# Patient Record
Sex: Female | Born: 1990 | Race: White | Hispanic: No | Marital: Single | Smoking: Former smoker
Health system: Southern US, Community
[De-identification: ages and names within clinical notes are randomized; demographics above are authoritative.]

## PROBLEM LIST (undated history)

## (undated) DIAGNOSIS — R569 Unspecified convulsions: Secondary | ICD-10-CM

## (undated) DIAGNOSIS — K5900 Constipation, unspecified: Secondary | ICD-10-CM

## (undated) DIAGNOSIS — G8929 Other chronic pain: Secondary | ICD-10-CM

## (undated) DIAGNOSIS — E119 Type 2 diabetes mellitus without complications: Secondary | ICD-10-CM

## (undated) DIAGNOSIS — E039 Hypothyroidism, unspecified: Secondary | ICD-10-CM

## (undated) DIAGNOSIS — F419 Anxiety disorder, unspecified: Secondary | ICD-10-CM

## (undated) DIAGNOSIS — E785 Hyperlipidemia, unspecified: Secondary | ICD-10-CM

## (undated) DIAGNOSIS — F909 Attention-deficit hyperactivity disorder, unspecified type: Secondary | ICD-10-CM

## (undated) DIAGNOSIS — M549 Dorsalgia, unspecified: Secondary | ICD-10-CM

## (undated) DIAGNOSIS — F819 Developmental disorder of scholastic skills, unspecified: Secondary | ICD-10-CM

## (undated) DIAGNOSIS — F319 Bipolar disorder, unspecified: Secondary | ICD-10-CM

## (undated) DIAGNOSIS — F32A Depression, unspecified: Secondary | ICD-10-CM

## (undated) HISTORY — PX: THROAT SURGERY: SHX803

---

## 2013-09-02 ENCOUNTER — Emergency Department (HOSPITAL_COMMUNITY)
Admission: EM | Admit: 2013-09-02 | Discharge: 2013-09-02 | Disposition: A | Discharge status: Other Acute Inpatient Hospital | Payer: Medicaid Other | Attending: Emergency Medicine | Admitting: Emergency Medicine

## 2013-09-02 ENCOUNTER — Encounter (HOSPITAL_COMMUNITY): Payer: Self-pay | Admitting: Emergency Medicine

## 2013-09-02 ENCOUNTER — Emergency Department (HOSPITAL_COMMUNITY)
Admission: EM | Admit: 2013-09-02 | Discharge: 2013-09-03 | Disposition: A | Discharge status: Other Acute Inpatient Hospital | Payer: Medicaid Other | Attending: Emergency Medicine | Admitting: Emergency Medicine

## 2013-09-02 DIAGNOSIS — R4182 Altered mental status, unspecified: Secondary | ICD-10-CM | POA: Insufficient documentation

## 2013-09-02 DIAGNOSIS — Z3202 Encounter for pregnancy test, result negative: Secondary | ICD-10-CM | POA: Insufficient documentation

## 2013-09-02 DIAGNOSIS — Z880 Allergy status to penicillin: Secondary | ICD-10-CM | POA: Insufficient documentation

## 2013-09-02 DIAGNOSIS — F1210 Cannabis abuse, uncomplicated: Secondary | ICD-10-CM

## 2013-09-02 DIAGNOSIS — F1410 Cocaine abuse, uncomplicated: Secondary | ICD-10-CM | POA: Diagnosis not present

## 2013-09-02 DIAGNOSIS — F17200 Nicotine dependence, unspecified, uncomplicated: Secondary | ICD-10-CM | POA: Diagnosis present

## 2013-09-02 DIAGNOSIS — F29 Unspecified psychosis not due to a substance or known physiological condition: Secondary | ICD-10-CM

## 2013-09-02 DIAGNOSIS — G40909 Epilepsy, unspecified, not intractable, without status epilepticus: Secondary | ICD-10-CM | POA: Insufficient documentation

## 2013-09-02 DIAGNOSIS — Z79899 Other long term (current) drug therapy: Secondary | ICD-10-CM | POA: Insufficient documentation

## 2013-09-02 DIAGNOSIS — F1220 Cannabis dependence, uncomplicated: Secondary | ICD-10-CM | POA: Diagnosis present

## 2013-09-02 DIAGNOSIS — R4189 Other symptoms and signs involving cognitive functions and awareness: Secondary | ICD-10-CM | POA: Diagnosis present

## 2013-09-02 HISTORY — DX: Unspecified convulsions: R56.9

## 2013-09-02 LAB — RAPID URINE DRUG SCREEN, HOSP PERFORMED
Amphetamines: NOT DETECTED
Barbiturates: NOT DETECTED
Benzodiazepines: NOT DETECTED
Cocaine: NOT DETECTED
Opiates: NOT DETECTED
Tetrahydrocannabinol: POSITIVE — AB

## 2013-09-02 LAB — CBC WITH DIFFERENTIAL/PLATELET
Basophils Absolute: 0 10*3/uL (ref 0.0–0.1)
Basophils Relative: 0 % (ref 0–1)
Eosinophils Absolute: 0.2 10*3/uL (ref 0.0–0.7)
Eosinophils Relative: 2 % (ref 0–5)
HCT: 44.6 % (ref 36.0–46.0)
Hemoglobin: 15.2 g/dL — ABNORMAL HIGH (ref 12.0–15.0)
Lymphocytes Relative: 30 % (ref 12–46)
Lymphs Abs: 2.6 10*3/uL (ref 0.7–4.0)
MCH: 28.3 pg (ref 26.0–34.0)
MCHC: 34.1 g/dL (ref 30.0–36.0)
MCV: 82.9 fL (ref 78.0–100.0)
Monocytes Absolute: 0.7 10*3/uL (ref 0.1–1.0)
Monocytes Relative: 9 % (ref 3–12)
Neutro Abs: 5.1 10*3/uL (ref 1.7–7.7)
Neutrophils Relative %: 59 % (ref 43–77)
Platelets: 323 10*3/uL (ref 150–400)
RBC: 5.38 MIL/uL — ABNORMAL HIGH (ref 3.87–5.11)
RDW: 12.9 % (ref 11.5–15.5)
WBC: 8.5 10*3/uL (ref 4.0–10.5)

## 2013-09-02 LAB — COMPREHENSIVE METABOLIC PANEL
ALT: 29 U/L (ref 0–35)
AST: 17 U/L (ref 0–37)
Albumin: 4.1 g/dL (ref 3.5–5.2)
Alkaline Phosphatase: 125 U/L — ABNORMAL HIGH (ref 39–117)
BUN: 5 mg/dL — ABNORMAL LOW (ref 6–23)
CO2: 24 mEq/L (ref 19–32)
Calcium: 10 mg/dL (ref 8.4–10.5)
Chloride: 101 mEq/L (ref 96–112)
Creatinine, Ser: 0.62 mg/dL (ref 0.50–1.10)
GFR calc Af Amer: >90 mL/min (ref >90)
GFR calc non Af Amer: >90 mL/min (ref >90)
Glucose, Bld: 123 mg/dL — ABNORMAL HIGH (ref 70–99)
Potassium: 3.6 mEq/L (ref 3.5–5.1)
Sodium: 136 mEq/L (ref 135–145)
Total Bilirubin: 0.6 mg/dL (ref 0.3–1.2)
Total Protein: 7.7 g/dL (ref 6.0–8.3)

## 2013-09-02 LAB — CARBAMAZEPINE LEVEL, TOTAL: Carbamazepine Lvl: <0.5 ug/mL — ABNORMAL LOW (ref 4.0–12.0)

## 2013-09-02 LAB — POCT PREGNANCY, URINE: Preg Test, Ur: NEGATIVE

## 2013-09-02 LAB — ETHANOL: Alcohol, Ethyl (B): <11 mg/dL (ref 0–11)

## 2013-09-02 MED ORDER — NICOTINE POLACRILEX 2 MG MT GUM: 2.0000 mg | CHEWING_GUM | OROMUCOSAL | Status: DC | PRN

## 2013-09-02 MED ORDER — ONDANSETRON HCL 4 MG PO TABS: 4.0000 mg | ORAL_TABLET | Freq: Three times a day (TID) | ORAL | Status: DC | PRN

## 2013-09-02 MED ORDER — ACETAMINOPHEN 325 MG PO TABS
650.0000 mg | ORAL_TABLET | Freq: Once | ORAL | Status: AC
  Administered 2013-09-02: 325 mg via ORAL
  Filled 2013-09-02: qty 2

## 2013-09-02 MED ORDER — ACETAMINOPHEN 325 MG PO TABS
325.0000 mg | ORAL_TABLET | Freq: Once | ORAL | Status: DC
  Filled 2013-09-02: qty 1

## 2013-09-02 NOTE — ED Provider Notes (Signed)
CSN: [id number]     Arrival date & time 09/02/13  1309 History  This chart was scribed for non-physician practitioner, Teressa Lower, NP working with Doug Sou, MD by Greggory Stallion, ED scribe. This patient was seen in room [facility] and the patient's care was started at 2:04 PM.   Chief Complaint  Patient presents with  . Medical Clearance   The history is provided by the patient. No language interpreter was used.   HPI Comments: Tami [name] is a 22 y.o. female who presents to the Emergency Department for medical clearance. Pt is here to get labs done for [facility]. She states she last used "powder" crack two weeks ago. Pt denies SI. She takes medications daily. Pt has papers on her as pt was wandering around without appropriate seasonal clothes on:pt is unsure why she is here  Past Medical History  Diagnosis Date  . Seizures    History reviewed. No pertinent past surgical history. No family history on file. History  Substance Use Topics  . Smoking status: Current Every Day Smoker  . Smokeless tobacco: Not on file  . Alcohol Use: No   OB History   Grav Para Term Preterm Abortions TAB SAB Ect Mult Living                 Review of Systems  Psychiatric/Behavioral: Negative for suicidal ideas.  All other systems reviewed and are negative.    Allergies  Benadryl and Penicillins  Home Medications   Current Outpatient Rx  Name  Route  Sig  Dispense  Refill  . carbamazepine (TEGRETOL) 200 MG tablet   Oral   Take 200 mg by mouth 4 (four) times daily.         Marland Kitchen ibuprofen (ADVIL,MOTRIN) 200 MG tablet   Oral   Take 200 mg by mouth every 6 (six) hours as needed for pain.          BP 118/83  Pulse 100  Temp(Src) 98.8 F (37.1 C) (Oral)  Resp 16  SpO2 100%  LMP 07/03/2013  Physical Exam  Nursing note and vitals reviewed. Constitutional: She is oriented to person, place, and time. She appears well-developed and well-nourished. No distress.  HENT:  Head:  Normocephalic and atraumatic.  Eyes: EOM are normal.  Neck: Neck supple. No tracheal deviation present.  Cardiovascular: Normal rate.   Pulmonary/Chest: Effort normal. No respiratory distress.  Musculoskeletal: Normal range of motion.  Neurological: She is alert and oriented to person, place, and time.  Skin: Skin is warm and dry.  Psychiatric: She expresses no homicidal and no suicidal ideation.  Pt is slow to answer question with response that is not able to understand    ED Course  Procedures (including critical care time)  DIAGNOSTIC STUDIES: Oxygen Saturation is 100% on RA, normal by my interpretation.    COORDINATION OF CARE: 2:06 PM-Discussed treatment plan which includes labs with pt at bedside and pt agreed to plan.   Labs Review Labs Reviewed  CBC WITH DIFFERENTIAL - Abnormal; Notable for the following:    RBC 5.38 (*)    Hemoglobin 15.2 (*)    All other components within normal limits  COMPREHENSIVE METABOLIC PANEL - Abnormal; Notable for the following:    Glucose, Bld 123 (*)    BUN 5 (*)    Alkaline Phosphatase 125 (*)    All other components within normal limits  URINE RAPID DRUG SCREEN (HOSP PERFORMED) - Abnormal; Notable for the following:    Tetrahydrocannabinol POSITIVE (*)  All other components within normal limits  ETHANOL  POCT PREGNANCY, URINE   Imaging Review No results found.  EKG Interpretation   None       MDM   1. Altered mental status   2. Marijuana abuse        I personally performed the services described in this documentation, which was scribed in my presence. The recorded information has been reviewed and is accurate.   Teressa Lower, NP 09/02/13 1520

## 2013-09-02 NOTE — BH Assessment (Addendum)
BHH Assessment Progress Note Update:  Called ED and per Triage nurse, tele assessment scheduled for 1745.  Received call for tele assessment.  This scheduled for 1730.

## 2013-09-02 NOTE — Consult Note (Signed)
Agree with plan 

## 2013-09-02 NOTE — ED Notes (Signed)
I received a phone call from [facility] earlier today. Told me about this patient who was coming over for med clearance.  [facility] told me that this patient was confused and psychotic but as long as she was medically cleared, she would be accepted back to [facility].  Pt came to [facility], had labs drawn, urine collected, was medically cleared by a provider, and was accepted back to [facility] after results were faxed to [facility].  Less than one hour after this patient went back to [place], I received another phone call from [place] saying that they were sending the patient back to [place] because the patient was confused.  They stated that the confusion was no more than it was when they originally saw the patient but that the doctor stated that she may be confused because of a possible recent seizure.  I told them that the patient had been medically clear and unless the patient had a seizure since she was with [place], there was no reason for her to come back.  I was told that she was coming back anyway.  I requested that the doctor at [facility] talk to one of our doctors in order for one of our doctors to accept her.  [facility] sent the patient over anyway.  The doctor called after the patient was already a patient back over here and told me that we needed to do an EEG.  I told him that we do not do EEGs in the ER and that if the patient returned, we would not do anything differently than they would at [facility] because the patient is not having active seizures and stated that the last one was a month or more ago.  Dr. Loretha Stapler spoke with the doctor about what part of his assessment made him think that the patient's behavior was due to seizures.  The doctor at [facility] admitted that he hadn't actually assessed the patient.

## 2013-09-02 NOTE — ED Notes (Signed)
Patient confused, unable to tell RN what city she is from or where she is living currently. Pt only repeating the phrase "When can I leave?". Pt with thought-blocking. No distress noted at this time.

## 2013-09-02 NOTE — ED Notes (Signed)
Dropped 1 Tylenol on the floor

## 2013-09-02 NOTE — ED Provider Notes (Signed)
  Physical Exam  LMP 07/03/2013  Physical Exam  ED Course  Procedures  MDM 5:00 PM Pt cam back from [facility] because they say that they have no way to give her her tegretol:pt states that she is unsure if she is taking the medication:will day a level  5:44 PM Pt states that she is not on tegretol and hasn't been since she was young:pt to be evaluated by tts    Teressa Lower, NP 09/02/13 1701  Teressa Lower, NP 09/02/13 1745

## 2013-09-02 NOTE — ED Notes (Addendum)
Pt here for Tegretol level after being medically cleared and sent to [facility], was sent back d/t risk of seizures. Pt will be cleared and sent to psych. GPD at bedside

## 2013-09-02 NOTE — Consult Note (Signed)
[facility] Face-to-Face Psychiatry Consult   Reason for Consult:  ED Referral/ Referring Physician:  ED Providers /Dr Dorice Lamas Tami [name] is an 22 y.o. female.  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 Seizures    AXIS IV:  problems with access to health care services AXIS V:  21-30 behavior considerably influenced by delusions or hallucinations OR serious impairment in judgment, communication OR inability to function in almost all areas  Plan:  Recommend psychiatric Inpatient admission when medically cleared.  Subjective:   Tami [name] is a 22 y.o. female patient admitted with thought disorder of unknown etiology.She was attended by [facility] today who returned her to the ED after she was medically cleared at their request by a [facility] Physician who did not examine her but ?supposed her mental status was due to seizures and on the grounds she neeeded an EEG because of a hx of seizure disorder(see ED Provider note for return visit ).She underwent teleassessment earlier and was felt to be psychotic and neede IP treatment.  HPI:  AS above.Also see ED provider note. Pt states she went to a party last nite and met a guy who said they should be together.She states she sat in the hall of the hotel where the party was and was aked to leave.She at first said she was homeless but then stated that a friend had offerd to let her stay at her place(-she would go there tonite if she was released) ?She denies being given any drug at the party or smoking any THC/other smokeable drug.She did admit to using cocaine last week to the staff.She denies any recent pot use or drug use. She states she moved to [place] from [place] 5 mos ago to her uncle's home Tami [name]). She says her winter clothes are at her mom's exboyfriend's house. She states she was in [place] in [place].She states he was on Tegretol after  seizure 5 mos ago but has not taken it in a while. She doesn't know why she hasnt taken it.The only other rx Med she recalls being on is Adderall.She is craving a cigarette and cannot wear a patch due to skin reaction to adhesive patch. HPI Elements:   Context:  Per HPI.  Past Psychiatric History: Past Medical History  Diagnosis Date  . Seizures     reports that she has been smoking.  She does not have any smokeless tobacco history on file. She reports that she uses illicit drugs (Marijuana). She reports that she does not drink alcohol. No family history on file. Family History Substance Abuse: No Family Supports: Yes, List: (parent per pt) Living Arrangements: Other (Comment) (Homeless) Can pt return to current living arrangement?: Yes Abuse/Neglect [facility]) Physical Abuse: Denies Verbal Abuse: Denies Sexual Abuse: Denies Allergies:  Nicorette patch (adhesive) Allergies  Allergen Reactions  . Benadryl [Diphenhydramine]   . Penicillins     ACT Assessment Complete:  Yes:    Educational Status    Risk to Self: Risk to self Suicidal Ideation: No Suicidal Intent: No Is patient at risk for suicide?: No Suicidal Plan?: No Access to Means: No What has been your use of drugs/alcohol within the last 12 months?: pt denies, but UDS positive for THC Previous Attempts/Gestures: No How many times?: 0 Other Self Harm Risks: pt denies Triggers for Past Attempts: None known Intentional Self Injurious Behavior: None Family Suicide History: No Recent stressful life event(s):  Other (Comment) (pt psychotic, under IVC, homeless) Persecutory voices/beliefs?: No Depression:  (pt denies) Depression Symptoms:  (pt denies) Substance abuse history and/or treatment for substance abuse?: Yes Suicide prevention information given to non-admitted patients: Not applicable  Risk to Others: Risk to Others Homicidal Ideation: No Thoughts of Harm to Others: No Current Homicidal Intent: No Current Homicidal  Plan: No Access to Homicidal Means: No Identified Victim: pt denies History of harm to others?: No Assessment of Violence: None Noted Violent Behavior Description: na - pt calm, cooperative Does patient have access to weapons?: No Criminal Charges Pending?: No Does patient have a court date: No  Abuse: Abuse/Neglect Assessment (Assessment to be complete while patient is alone) Physical Abuse: Denies Verbal Abuse: Denies Sexual Abuse: Denies Exploitation of patient/patient's resources: Denies Self-Neglect: Denies  Prior Inpatient Therapy: Prior Inpatient Therapy Prior Inpatient Therapy: No Prior Therapy Dates: na Prior Therapy Facilty/Provider(s): na Reason for Treatment: na  Prior Outpatient Therapy: Prior Outpatient Therapy Prior Outpatient Therapy: Yes Prior Therapy Dates: pt couldn't recall date - when she was younger by report Prior Therapy Facilty/Provider(s): Unknown provider Reason for Treatment: ADHD  Additional Information: Additional Information 1:1 In Past 12 Months?: No CIRT Risk: No Elopement Risk: No Does patient have medical clearance?: Yes                  Objective: Blood pressure 120/84, pulse 87, temperature 98 F (36.7 C), temperature source Oral, resp. rate 15, last menstrual period 07/03/2013, SpO2 100.00%.There is no height or weight on file to calculate BMI. Results for orders placed during the hospital encounter of 09/02/13 (from the past 72 hour(s))  CARBAMAZEPINE LEVEL, TOTAL     Status: Abnormal   Collection Time    09/02/13  5:00 PM      Result Value Range   Carbamazepine Lvl <0.5 (*) 4.0 - 12.0 ug/mL   Comment: Performed at [facility] are reviewed and are pertinent for No tegretol level and THC in Urine.  Current Facility-Administered Medications  Medication Dose Route Frequency Provider Last Rate Last Dose  . acetaminophen (TYLENOL) tablet 325 mg  325 mg Oral Once Teressa Lower, NP      . ondansetron  (ZOFRAN) tablet 4 mg  4 mg Oral Q8H PRN Teressa Lower, NP       Current Outpatient Prescriptions  Medication Sig Dispense Refill  . carbamazepine (TEGRETOL) 200 MG tablet Take 200 mg by mouth 4 (four) times daily.      Marland Kitchen ibuprofen (ADVIL,MOTRIN) 200 MG tablet Take 200 mg by mouth every 6 (six) hours as needed for pain.        Psychiatric Specialty Exam:     Blood pressure 120/84, pulse 87, temperature 98 F (36.7 C), temperature source Oral, resp. rate 15, last menstrual period 07/03/2013, SpO2 100.00%.There is no height or weight on file to calculate BMI.  General Appearance: Disheveled  Eye Solicitor::  Fair  Speech:  Blocked/slow/garbled at times  Volume:  Normal  Mood:  Euthymic  Affect:  Flat  Thought Process:  Disorganized  Orientation:  Other:  to person  Thought Content:  Responds to questions but at one point she takes a piece of paper out from under her pillow and writs somthing on it then puts it back.When she talked about her uncle Tami [name] she took the paper out again and asked "How do you spell Tami [name]" She alluded to the fact she was having trouble remebering (names)  Suicidal Thoughts:  No  Homicidal Thoughts:  No  Memory:  Immediate;   Poor  Judgement:  Impaired  Insight:  Lacking  Psychomotor Activity:  NA  Concentration:  Poor  Recall:  Poor  Akathisia:  NA  Handed:  Right  AIMS (if indicated):     Assets:  Others:  has family  Sleep:      Treatment Plan Summary: Need her records from [facility] (?[place]).Continue to observe in ED and reassess in AM.The3 etiology of her thought disorder is unclear at this time  Court Joy 09/02/2013 8:14 PM

## 2013-09-02 NOTE — ED Notes (Signed)
Pt lab results faxed and called report to [place] at [facility]. Awaiting her callback to clear pt to be transported back to [facility]

## 2013-09-02 NOTE — ED Provider Notes (Signed)
Medical screening examination/treatment/procedure(s) were performed by non-physician practitioner and as supervising physician I was immediately available for consultation/collaboration.  EKG Interpretation   None        Doug Sou, MD 09/02/13 1559

## 2013-09-02 NOTE — BH Assessment (Addendum)
Tele Assessment Note   Tami [name] is an 22 y.o. female that was assessed this day per order by Teressa Lower, NP at [facility].  Pt presented there with GPD under IVC from [facility] requesting medical clearance.  It appears [facility] requested her Tegretol level, as pt has hx of seizures, and then pt transported back to [place].  However, pt turned away, sent back to [facility], stating she couldn't get ehr medications there.  Pt appears confused and was found wandering around without appropriate seasonal clothing on.  Pt is only oriented to person during assessment with this clinician.  She appears confused, pauses frequently before answering questions, and has rapid, pressured speech, and appears irritated, confused, and preoccupied.  Pt appears to be responding to internal stimuli, although denies AVH.  Pt denies SI, HI or SA.  However, she told ED nurse she used crack cocaine 2 weeks ago and her UDS was positive for THC.  Pt stated she has a hx of seizures but has not been taking her medication.  Pt stated she was treated for ADHD as a child, but has had no MH or SA treatment since.  Pt stated she was at a hotel at a party and then a drug treatment center to get labs earlier [facility]?) and then she was to go to a homeless shelter.  However, pt stated she lives with a friend and isn't homeless.  Pt appears psychotic and is a poor historian.  Consulted with NP Pickering who agreed further examination and recommendations warranted by an extender from [facility].  Inpatient treatment is recommended by this clinician.  A tele psych will be scheduled.  Updated ED staff and TTS staff.  Axis I: 298.9 Psychotic Disorder NOS Axis II: Deferred Axis III:  Past Medical History  Diagnosis Date  . Seizures    Axis IV: housing problems, other psychosocial or environmental problems and problems related to social environment Axis V: 21-30 behavior considerably influenced by delusions or hallucinations OR serious impairment in  judgment, communication OR inability to function in almost all areas  Past Medical History:  Past Medical History  Diagnosis Date  . Seizures     History reviewed. No pertinent past surgical history.  Family History: No family history on file.  Social History:  reports that she has been smoking.  She does not have any smokeless tobacco history on file. She reports that she uses illicit drugs (Marijuana). She reports that she does not drink alcohol.  Additional Social History:  Alcohol / Drug Use Pain Medications: see MAR Prescriptions: see MAR Over the Counter: see MAR History of alcohol / drug use?:  (pt denies, but UDS positive for THC) Longest period of sobriety (when/how long):  (pt denies) Negative Consequences of Use:  (pt denies) Withdrawal Symptoms:  (pt denies)  CIWA: CIWA-Ar BP: 107/72 mmHg Pulse Rate: 90 COWS:    Allergies:  Allergies  Allergen Reactions  . Benadryl [Diphenhydramine]   . Penicillins     Home Medications:  (Not in a hospital admission)  OB/GYN Status:  Patient's last menstrual period was 07/03/2013.  General Assessment Data [facility] of Assessment: [facility] Is this a Tele or Face-to-Face Assessment?: Tele Assessment Is this an Initial Assessment or a Re-assessment for this encounter?: Initial Assessment Living Arrangements: Other (Comment) (Homeless) Can pt return to current living arrangement?: Yes Admission Status: Involuntary Is patient capable of signing voluntary admission?: No (pt is psychotic) Transfer from: Acute Hospital Referral Source: Other Museum/gallery curator)  Medical Screening Exam [facility] Walk-in  ONLY) Medical Exam completed: No Reason for MSE not completed: Other: (pt med cleared at [facility])  [facility] Crisis Care Plan Living Arrangements: Other (Comment) (Homeless) Name of Psychiatrist: none Name of Therapist: none  Education Status Is patient currently in school?: No  Risk to self Suicidal Ideation: No Suicidal Intent: No Is patient  at risk for suicide?: No Suicidal Plan?: No Access to Means: No What has been your use of drugs/alcohol within the last 12 months?: pt denies, but UDS positive for THC Previous Attempts/Gestures: No How many times?: 0 Other Self Harm Risks: pt denies Triggers for Past Attempts: None known Intentional Self Injurious Behavior: None Family Suicide History: No Recent stressful life event(s): Other (Comment) (pt psychotic, under IVC, homeless) Persecutory voices/beliefs?: No Depression:  (pt denies) Depression Symptoms:  (pt denies) Substance abuse history and/or treatment for substance abuse?: No Suicide prevention information given to non-admitted patients: Not applicable  Risk to Others Homicidal Ideation: No Thoughts of Harm to Others: No Current Homicidal Intent: No Current Homicidal Plan: No Access to Homicidal Means: No Identified Victim: pt denies History of harm to others?: No Assessment of Violence: None Noted Violent Behavior Description: na - pt calm, cooperative Does patient have access to weapons?: No Criminal Charges Pending?: No Does patient have a court date: No  Psychosis Hallucinations:  (pt denies, but appears to be responding to internal stimuli) Delusions:  (Unknown)  Mental Status Report Appear/Hygiene: Disheveled Eye Contact: Good Motor Activity: Freedom of movement;Unremarkable Speech: Rapid;Pressured Level of Consciousness: Alert;Irritable Mood: Irritable;Suspicious Affect: Preoccupied Anxiety Level: Minimal Thought Processes: Coherent;Relevant Judgement: Impaired Orientation: Person Obsessive Compulsive Thoughts/Behaviors: None  Cognitive Functioning Concentration:  (UTA) Memory:  (UTA) IQ: Average Insight:  (UTA) Impulse Control:  (UTA) Appetite: Good Weight Loss: 0 Weight Gain: 0 Sleep: No Change Total Hours of Sleep:  ("through the night") Vegetative Symptoms: None  ADLScreening [facility] Assessment Services) Patient's cognitive  ability adequate to safely complete daily activities?: Yes Patient able to express need for assistance with ADLs?: Yes Independently performs ADLs?: Yes (appropriate for developmental age)  Prior Inpatient Therapy Prior Inpatient Therapy: No Prior Therapy Dates: na Prior Therapy Facilty/Provider(s): na Reason for Treatment: na  Prior Outpatient Therapy Prior Outpatient Therapy: Yes Prior Therapy Dates: pt couldn't recall date - when she was younger by report Prior Therapy Facilty/Provider(s): Unknown provider Reason for Treatment: ADHD  ADL Screening (condition at time of admission) Patient's cognitive ability adequate to safely complete daily activities?: Yes Is the patient deaf or have difficulty hearing?: No Does the patient have difficulty seeing, even when wearing glasses/contacts?: No Does the patient have difficulty concentrating, remembering, or making decisions?: No Patient able to express need for assistance with ADLs?: Yes Does the patient have difficulty dressing or bathing?: No Independently performs ADLs?: Yes (appropriate for developmental age)  Home Assistive Devices/Equipment Home Assistive Devices/Equipment: None    Abuse/Neglect Assessment (Assessment to be complete while patient is alone) Physical Abuse: Denies Verbal Abuse: Denies Sexual Abuse: Denies Exploitation of patient/patient's resources: Denies Self-Neglect: Denies Values / Beliefs Cultural Requests During Hospitalization: None Spiritual Requests During Hospitalization: None Consults Spiritual Care Consult Needed: No Social Work Consult Needed: No Merchant navy officer (For Healthcare) Advance Directive: Patient does not have advance directive;Patient would not like information    Additional Information 1:1 In Past 12 Months?: No CIRT Risk: No Elopement Risk: No Does patient have medical clearance?: Yes     Disposition:  Disposition Initial Assessment Completed for this Encounter:  Yes Disposition of Patient: Other dispositions Other  disposition(s): Other (Comment) (Pending tele psych by extender)  Caryl Comes 09/02/2013 6:13 PM

## 2013-09-02 NOTE — ED Notes (Addendum)
Pt from [place] with security requesting med clearance to return to [facility]. Pt is IVC. Pt is unable to answer some questions, appears to be confused as per stated in IVC papers. Pt denies self harm

## 2013-09-02 NOTE — ED Notes (Signed)
Unable to obtain medical records from [place] at this time.

## 2013-09-03 ENCOUNTER — Encounter (HOSPITAL_COMMUNITY): Payer: Self-pay | Admitting: *Deleted

## 2013-09-03 ENCOUNTER — Inpatient Hospital Stay (HOSPITAL_COMMUNITY)
Admission: AD | Admit: 2013-09-03 | Discharge: 2013-09-11 | DRG: 885 | Disposition: A | Discharge status: Home / Self Care | Payer: Federal, State, Local not specified - Other | Source: Intra-hospital | Attending: Psychiatry | Admitting: Psychiatry

## 2013-09-03 DIAGNOSIS — R569 Unspecified convulsions: Secondary | ICD-10-CM | POA: Diagnosis present

## 2013-09-03 DIAGNOSIS — F29 Unspecified psychosis not due to a substance or known physiological condition: Secondary | ICD-10-CM | POA: Diagnosis present

## 2013-09-03 DIAGNOSIS — F1220 Cannabis dependence, uncomplicated: Secondary | ICD-10-CM | POA: Diagnosis present

## 2013-09-03 DIAGNOSIS — F1410 Cocaine abuse, uncomplicated: Secondary | ICD-10-CM

## 2013-09-03 DIAGNOSIS — Z79899 Other long term (current) drug therapy: Secondary | ICD-10-CM

## 2013-09-03 DIAGNOSIS — F319 Bipolar disorder, unspecified: Principal | ICD-10-CM | POA: Diagnosis present

## 2013-09-03 NOTE — BHH Counselor (Signed)
Writer called [facility] and asked him to send pt referral to [facility] as [facility] has some acute beds available (per Kristen CSW).  Tami [name], LCSWA Assessment Counselor  

## 2013-09-03 NOTE — Progress Notes (Signed)
Patient ID: Tami [name], female   DOB: [birth year], 22 y.o.   MRN: [id number] Patient Identification:  Tami [name] Date of Evaluation:  09/03/2013   History of Present Illness:  Patient was picked up from  A hotel lobby  With no place to go to.  Patient states she is homeless and stayed at the hotel lobby after a party last night.  She ended up in [place] with a female and female acquaintance.  Patient ended up at [facility] for medical confused and asking for her medications.   Staff from [facility] sent her to [facility] for medical clearance.   Patient was vague in answering questions and states she has her medications in her bag.  It is not known if patient is taking her medications as ordered since her Tegretol level is sub therapeutic.  Patient does not remember her mother's number for collateral information.  We will obtain medical record from [facility] to enable [place] plan care for the patient.  We have accepted her for admission for safety and stabilization.  She denies SI/HI/AVH.  Past Psychiatric History: Schizoaffective d/o, bipolar type   Past Medical History:     Past Medical History  Diagnosis Date  . Seizures       History reviewed. No pertinent past surgical history.  Allergies:  Allergies  Allergen Reactions  . Benadryl [Diphenhydramine]   . Penicillins     Current Medications:  Prior to Admission medications   Medication Sig Start Date End Date Taking? Authorizing Provider  carbamazepine (TEGRETOL) 200 MG tablet Take 200 mg by mouth 4 (four) times daily.   Yes Historical Provider, MD  ibuprofen (ADVIL,MOTRIN) 200 MG tablet Take 200 mg by mouth every 6 (six) hours as needed for pain.   Yes Historical Provider, MD    Social History:    reports that she has been smoking.  She does not have any smokeless tobacco history on file. She reports that she uses illicit drugs (Marijuana). She reports that she does not drink alcohol.   Family History:    No family history on  file.  Mental Status Examination/Evaluation:Psychiatric Specialty Exam: Physical Exam  ROS  Blood pressure 106/72, pulse 63, temperature 97.6 F (36.4 C), temperature source Oral, resp. rate 18, last menstrual period 07/03/2013, SpO2 100.00%.There is no height or weight on file to calculate BMI.  General Appearance: Casual  Eye Contact::  Fair  Speech:  Pressured  Volume:  Normal  Mood:  Anxious and Depressed  Affect:  Blunt, Constricted, Depressed, Flat and Labile  Thought Process:  Disorganized  Orientation:  Full (Time, Place, and Person)  Thought Content:  confused, refusing to answer some qustions  Suicidal Thoughts:  No  Homicidal Thoughts:  No  Memory:  Immediate;   Fair Recent;   Fair Remote;   Fair  Judgement:  Poor  Insight:  Fair and Shallow  Psychomotor Activity:  Normal  Concentration:  Fair  Recall:  NA  Akathisia:  NA  Handed:  Right  AIMS (if indicated):     Assets:  Desire for Improvement  Sleep:          DIAGNOSIS:   AXIS I   Psychosis, R/O substance induced Psychosis  AXIS II  Deffered  AXIS III See medical notes.  AXIS IV housing problems, occupational problems, other psychosocial or environmental problems and problems related to social environment  AXIS V 21-30 behavior considerably influenced by delusions or hallucinations OR serious impairment in judgment, communication OR inability to function in almost  all areas     Assessment/Plan:  Face to face evaluation with Dr Tawni Carnes We have accepted patient for admission to our 400 unit hall for safety and stabilization We will also seek placement at other facilities with available beds We will be asking for her medical records from [facility].     Pt was interviewed with FNP. Agree with above assessment and plan.  Ancil Linsey, MD

## 2013-09-03 NOTE — Tx Team (Signed)
Initial Interdisciplinary Treatment Plan  PATIENT STRENGTHS: (choose at least two) Active sense of humor Physical Health Special hobby/interest  PATIENT STRESSORS: Financial difficulties Marital or family conflict Medication change or noncompliance Substance abuse   PROBLEM LIST: Problem List/Patient Goals Date to be addressed Date deferred Reason deferred Estimated date of resolution  "To get better, just being myself" 09/03/13           Increased risk for suicide 09/03/13     Decreased thought process 09/03/13                                    DISCHARGE CRITERIA:  Ability to meet basic life and health needs Adequate post-discharge living arrangements Improved stabilization in mood, thinking, and/or behavior Motivation to continue treatment in a less acute level of care Need for constant or close observation no longer present Reduction of life-threatening or endangering symptoms to within safe limits Safe-care adequate arrangements made Verbal commitment to aftercare and medication compliance  PRELIMINARY DISCHARGE PLAN: Attend aftercare/continuing care group Outpatient therapy Participate in family therapy Placement in alternative living arrangements  PATIENT/FAMIILY INVOLVEMENT: This treatment plan has been presented to and reviewed with the patient, [employer], and/or family member.  The patient and family have been given the opportunity to ask questions and make suggestions.  Tami [name] 09/03/2013, 10:35 PM

## 2013-09-03 NOTE — Progress Notes (Signed)
B.Alcides Nutting, MHT was requested to seek placement for patient. Writer contacted the following facilities listed below;    [facility] spoke with Dennie Bible who reports availability and to fax for review  [facility] spoke with Rollene Fare who reports availability and to fax for review  [facility] spoke with [place] who reports availability and to fax for review  [facility] spoke with Steward Drone who reports availability and to fax for review  [facility] spoke with Christiane Ha who reports availability and to fax for review.

## 2013-09-03 NOTE — ED Notes (Signed)
No acute distress noted.

## 2013-09-03 NOTE — BHH Counselor (Addendum)
1334 - Writer faxed signed consent to release info to [place] at [facility] medical records [phone]. Writer requested all medical records regarding pt's last admission to [facility].  Evette Cristal, [place] Assessment Counselor  [phone] Writer called [facility] to get medical records. Administrative assistant stated they had no medical records for pt and only records would be the records sent over to [facility] [address] from [place]. Writer found two-page assessment note in pt's chart and gave note to [place] NP.  Evette Cristal, [place] Assessment Counselor

## 2013-09-03 NOTE — Progress Notes (Signed)
[facility] provided pt with a list of primary care resources, highlighting [facility] and a [facility] [employer] of the [place].

## 2013-09-04 DIAGNOSIS — F319 Bipolar disorder, unspecified: Principal | ICD-10-CM

## 2013-09-04 DIAGNOSIS — F1220 Cannabis dependence, uncomplicated: Secondary | ICD-10-CM

## 2013-09-04 MED ORDER — ALUM & MAG HYDROXIDE-SIMETH 200-200-20 MG/5ML PO SUSP: 30.0000 mL | ORAL | Status: DC | PRN

## 2013-09-04 MED ORDER — MAGNESIUM HYDROXIDE 400 MG/5ML PO SUSP: 30.0000 mL | Freq: Every day | ORAL | Status: DC | PRN

## 2013-09-04 MED ORDER — ACETAMINOPHEN 325 MG PO TABS
650.0000 mg | ORAL_TABLET | Freq: Four times a day (QID) | ORAL | Status: DC | PRN
  Administered 2013-09-06 – 2013-09-09 (×3): 650 mg via ORAL
  Filled 2013-09-04 (×3): qty 2

## 2013-09-04 MED ORDER — TRAZODONE HCL 50 MG PO TABS
50.0000 mg | ORAL_TABLET | Freq: Every evening | ORAL | Status: DC | PRN
Start: 2013-09-04 — End: 2013-09-11
  Administered 2013-09-04 – 2013-09-10 (×7): 50 mg via ORAL
  Filled 2013-09-04 (×17): qty 1

## 2013-09-04 MED ORDER — CARBAMAZEPINE ER 200 MG PO TB12
200.0000 mg | ORAL_TABLET | Freq: Two times a day (BID) | ORAL | Status: DC
  Administered 2013-09-04 – 2013-09-10 (×12): 200 mg via ORAL
  Filled 2013-09-04 (×15): qty 1

## 2013-09-04 MED ORDER — HYDROXYZINE HCL 25 MG PO TABS
25.0000 mg | ORAL_TABLET | Freq: Four times a day (QID) | ORAL | Status: DC | PRN
  Administered 2013-09-04 – 2013-09-06 (×3): 25 mg via ORAL
  Filled 2013-09-04 (×3): qty 1

## 2013-09-04 NOTE — Progress Notes (Signed)
D: Pt has poor insight, easily agitated, mild confusion and bizarre behaviors. Pt is childlike and continues to need redirecting by staff. Pt continues to walk around on the unit with her shirt raised and her stomach hanging out. Pt have also wandered into other pts room. Pt has no understanding as to why she is here. During assessment pt was disorganized and irritable . When asked by writer how pt is feeling, pt stated, "did you fart" and started to laugh inappropriately. Medications ordered per MD. Verbal support given. Pt encouraged to attend groups. 15 minute checks performed for safety. Pt given a T-shirt to cover up with. R: Pt safety maintained.

## 2013-09-04 NOTE — Progress Notes (Signed)
Adult Psychoeducational Group Note  Date:  09/04/2013 Time:  9:26 PM  Group Topic/Focus:  Wrap-Up Group:   The focus of this group is to help patients review their daily goal of treatment and discuss progress on daily workbooks.  Participation Level:  Active  Participation Quality:  Inattentive  Affect:  Flat  Cognitive:  Appropriate  Insight: Appropriate  Engagement in Group:  Limited  Modes of Intervention:  Discussion  Additional Comments:The patient seemed confused when asked a question.But when asked a few times she responded by saying she had a ok day  Octavio Manns 09/04/2013, 9:26 PM

## 2013-09-04 NOTE — BHH Group Notes (Signed)
BHH LCSW Group Therapy  09/04/2013 , 10:50 AM   Type of Therapy:  Group Therapy  Participation Level:  Minimal  Participation Quality:  Distracted  Affect:  Appropriate  Cognitive:  Alert  Insight: None  Engagement in Therapy:  None  Modes of Intervention:  Discussion, Exploration and Socialization  Summary of Progress/Problems: Today's group focused on the term Diagnosis.  Participants were asked to define the term, and then pronounce whether it is a negative, positive or neutral term.  Kaprice stayed for the entire group.  She appeared to be engaged, and would periodically blurt out "What are we talking about?"  No meaningful interaction.  Daryel Gerald B 09/04/2013 , 10:50 AM

## 2013-09-04 NOTE — Progress Notes (Signed)
Patient ID: Tami [name], female   DOB: 01-02-1991, 22 y.o.   MRN: [id number]  Pt was agitated, childlike and anxious during the adm process. Pt responses were delayed and had to be instructed several times during the adm process in order to complete the adm in a timely manner. Pt became belligerent and used explicative's when asked to take off her clothes in order for the writer to do a skin assessment.  Pt stated, "I don't know why I'm here. I'm homeless and the next thing I know I'm here in a fucking crazy hosp".  Pt was evasive and sarcastic at times during the assessment, stating she was "unsure" to several questions.  Report from ED stated pt was sent for med clearance by [facility]. Report states pt has no taken her tegretol in 5 months. Pt denied SI, HI, and A/V.
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
## 2013-09-04 NOTE — Progress Notes (Signed)
Pt is not allowed to leave the unit. Pt sexually inappropriate during supper. Pt continues to show her body parts and refuses to cover up when asked to by staff. Pt continues to need redirecting and is difficult to redirect.
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
## 2013-09-05 MED ORDER — OLANZAPINE 10 MG PO TBDP
10.0000 mg | ORAL_TABLET | Freq: Three times a day (TID) | ORAL | Status: DC | PRN
  Administered 2013-09-05 – 2013-09-10 (×7): 10 mg via ORAL
  Filled 2013-09-05 (×6): qty 1

## 2013-09-05 MED ORDER — OLANZAPINE 10 MG PO TBDP
ORAL_TABLET | ORAL | Status: AC
  Filled 2013-09-05: qty 1

## 2013-09-05 MED ORDER — HALOPERIDOL 2 MG PO TABS
2.0000 mg | ORAL_TABLET | Freq: Every day | ORAL | Status: DC
  Administered 2013-09-05 – 2013-09-08 (×4): 2 mg via ORAL
  Filled 2013-09-05 (×6): qty 1

## 2013-09-05 NOTE — Progress Notes (Signed)
D: Pt reports hearing voices this morning but would not specify. Pt reported passive SI thoughts and then laughed. Pt presents with some mild confusions. Pt continues to need redirecting from staff for inappropriate language and behaviors. Pt have snack containers all over her room that have been sitting there. There are ants surrounding the containers in the pt room. Pt refuses to clean up her trash, stating, "I like talking to the ants, they are all I have". Pt then started yelling and cursing at Clinical research associate. Pt is easily agitated and labile often throughout the day. Pt has poor insight and judgement. A: Medications administered as ordered per MD. Prn Zydis given to pt per. MD Verbal support given. Pt encouraged to attend groups. Pt encouraged to clean up after herself. 15 minute checks performed for safety. R: Pt safety maintained.

## 2013-09-05 NOTE — Progress Notes (Signed)
Pt's behaviors have continued to be impulsive and intrusive.  Pt is loud and arrogant at times.  Pt has voiced no needs or concerns tonight.  Pt denies SI/HI.  She says she still hears voices sometimes.  Pt went to bed early and her hs meds were taken to her room.  Pt took the meds without incident.  Pt makes her needs known to staff.  Support and encouragement offered.  Safety maintained with q15 minute checks.

## 2013-09-05 NOTE — BHH Group Notes (Signed)
[facility] Group Therapy  09/05/2013 , 1:52 PM    Type of Therapy:  [facility] [employer] Presentation  Participation Level:  Did not attend    Summary of Progress/Problems:  Onalee Hua from [employer] came to present his recovery story and play the guitar.    Daryel Gerald B 09/05/2013 , 1:52 PM

## 2013-09-05 NOTE — Tx Team (Signed)
  Interdisciplinary Treatment Plan Update   Date Reviewed:  09/05/2013  Time Reviewed:  8:07 AM  Progress in Treatment:   Attending groups: Yes Participating in groups: No, other than disruptive behavior Taking medication as prescribed: Yes  Tolerating medication: Yes Family/Significant other contact made: No Patient understands diagnosis: No  Limited insight Discussing patient identified problems/goals with staff: Yes  See initial care plan Medical problems stabilized or resolved: Yes Denies suicidal/homicidal ideation: Yes In tx team Patient has not harmed self or others: Yes  For review of initial/current patient goals, please see plan of care.  Estimated Length of Stay:  4-5 days  Reason for Continuation of Hospitalization: Medication stabilization Other; describe Mood lability, bizarre, sexualized behavior  New Problems/Goals identified:  N/A  Discharge Plan or Barriers:   unknown  Additional Comments:  Tami [name] is a 22 year old female patient who presented with [facility] under IVC from [facility] where she presented acting very confused and was sent for medical clearance at [facility]. Patient has appeared confused having been observed wandering around without appropriate seasonal clothing on and has demonstrated significant thought blocking. Today on assessment the patient takes very long pauses before answering questions but then responds with "I don't know or I'm not sure." She very irritable at a certain point jumping up in bed shouting "I'm tired of those mother-fuckers messing with me. I'm pissed." Eletha appeared to have no basic understanding of why she was admitted to the hospital. Nursing staff have reported extremely disorganized behaviors such as randomly wandering into her peers room and walking around exposing herself on the unit. She required a great deal of redirection to help maintain her safety. Patient has not been compliant with her medication for seizure disorder  tegretol as her level on admission was less than 0.5.    Attendees:  Signature: Thedore Mins, MD 09/05/2013 8:07 AM   Signature: Richelle Ito, LCSW 09/05/2013 8:07 AM  Signature: Fransisca Kaufmann, NP 09/05/2013 8:07 AM  Signature: Joslyn Devon, RN 09/05/2013 8:07 AM  Signature: Liborio Nixon, RN 09/05/2013 8:07 AM  Signature:  09/05/2013 8:07 AM  Signature:   09/05/2013 8:07 AM  Signature:    Signature:    Signature:    Signature:    Signature:    Signature:      Scribe for Treatment Team:   Richelle Ito, LCSW  09/05/2013 8:07 AM
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
## 2013-09-05 NOTE — [facility] Group Notes (Signed)
[facility]  LCSW Aftercare Discharge Planning Group Note   09/05/2013 10:38 AM  Participation Quality: Minimal    Mood/Affect:  Defensive  Depression Rating:  denies  Anxiety Rating:  denies  Thoughts of Suicide:  No Will you contract for safety?   NA  Current AVH:  Denies  Plan for Discharge/Comments:  Tajae seems confused throughout group.  While other group members were checking in, Samona blurted out "What are we doing?"  She became very defensive and irritable towards CSW when another group member had to leave group.  She stated "I don't care what he says!  You don't own this place!"  Marlo had to be escorted out of group.    Transportation Means: unk  Supports: unk-will not divulge information  Simona Huh

## 2013-09-05 NOTE — Progress Notes (Signed)
Recreation Therapy Notes  Date: 11.05.2014 Time: 9:30am Location: [address] Dayroom  Group Topic: Communication  Goal Area(s) Addresses:  Patient will effectively communicate with peers in group.  Patient will verbalize benefit of healthy communication. Patient will verbalize positive effect of healthy communication on post d/c goals.   Behavioral Response: Did not attend.   Sagan Maselli L Ahren Pettinger, LRT/CTRS  Luiscarlos Kaczmarczyk L 09/05/2013 1:13 PM 

## 2013-09-05 NOTE — Progress Notes (Signed)
[facility] MD Progress Note  09/05/2013 10:24 AM Tami [name]  MRN:  [id number] Subjective: " everyone here gets me angry, why are they telling me I can not do what I like? It is my right to do anything I want." Objective: Patient with bizarre and disorganized behavior. She is hypersexual wearing provocative dresses, reportedly taking off her clothes in the cafeteria. Patient has difficulty processing information, gets agitated easily, labile and has been aggressive towards her peers and staffs. Diagnosis:   DSM5: Schizophrenia Disorders:  none Obsessive-Compulsive Disorders:   Trauma-Stressor Disorders:   Substance/Addictive Disorders:  Cannabis Use Disorder - Moderate 9304.30) Depressive Disorders:  Mood disorder  Axis I: Bipolar disorder-unspecified           Cannabis use disorder.  ADL's:  Intact  Sleep: Fair  Appetite:  Fair  Suicidal Ideation: denies Homicidal Ideation: denies   AEB (as evidenced by):  Psychiatric Specialty Exam: Review of Systems  Constitutional: Negative.   HENT: Negative.   Eyes: Negative.   Respiratory: Negative.   Cardiovascular: Negative.   Gastrointestinal: Negative.   Genitourinary: Negative.   Musculoskeletal: Negative.   Skin: Negative.   Neurological: Negative.   Endo/Heme/Allergies: Negative.   Psychiatric/Behavioral: Positive for substance abuse. The patient is nervous/anxious and has insomnia.     Blood pressure 110/58, pulse 72, temperature 97.7 F (36.5 C), temperature source Oral, resp. rate 20, height 4' 9.5" (1.461 m), weight 63.504 kg (140 lb), last menstrual period 07/03/2013.Body mass index is 29.75 kg/(m^2).  General Appearance: Fairly Groomed  Patent attorney::  Minimal  Speech:  Pressured  Volume:  Increased  Mood:  Angry and Irritable  Affect:  Labile and Full Range  Thought Process:  Circumstantial  Orientation:  Full (Time, Place, and Person)  Thought Content:  Negative  Suicidal Thoughts:  No  Homicidal Thoughts:  No   Memory:  Immediate;   Fair Recent;   Fair Remote;   Fair  Judgement:  Poor  Insight:  Lacking  Psychomotor Activity:  Increased  Concentration:  Poor  Recall:  Poor  Akathisia:  No  Handed:  Right  AIMS (if indicated):     Assets:  Physical Health  Sleep:  Number of Hours: 6.5   Current Medications: Current Facility-Administered Medications  Medication Dose Route Frequency Provider Last Rate Last Dose  . acetaminophen (TYLENOL) tablet 650 mg  650 mg Oral Q6H PRN Kerry Hough, PA-C      . alum & mag hydroxide-simeth (MAALOX/MYLANTA) 200-200-20 MG/5ML suspension 30 mL  30 mL Oral Q4H PRN Kerry Hough, PA-C      . carbamazepine (TEGRETOL XR) 12 hr tablet 200 mg  200 mg Oral BID Cicley Ganesh   200 mg at 09/05/13 0812  . haloperidol (HALDOL) tablet 2 mg  2 mg Oral QHS Madinah Quarry      . hydrOXYzine (ATARAX/VISTARIL) tablet 25 mg  25 mg Oral Q6H PRN Fransisca Kaufmann, NP   25 mg at 09/05/13 6962  . magnesium hydroxide (MILK OF MAGNESIA) suspension 30 mL  30 mL Oral Daily PRN Kerry Hough, PA-C      . OLANZapine zydis (ZYPREXA) disintegrating tablet 10 mg  10 mg Oral Q8H PRN Virginia Curl      . traZODone (DESYREL) tablet 50 mg  50 mg Oral QHS,MR X 1 Kerry Hough, PA-C   50 mg at 09/04/13 2110    Lab Results: No results found for this or any previous visit (from the past 48 hour(s)).  Physical Findings:  AIMS: Facial and Oral Movements Muscles of Facial Expression: None, normal Lips and Perioral Area: None, normal Jaw: None, normal Tongue: None, normal,Extremity Movements Upper (arms, wrists, hands, fingers): None, normal Lower (legs, knees, ankles, toes): None, normal, Trunk Movements Neck, shoulders, hips: None, normal, Overall Severity Severity of abnormal movements (highest score from questions above): None, normal Incapacitation due to abnormal movements: None, normal Patient's awareness of abnormal movements (rate only patient's report): No Awareness, Dental  Status Current problems with teeth and/or dentures?: No Does patient usually wear dentures?: No  CIWA:    COWS:     Treatment Plan Summary: Daily contact with patient to assess and evaluate symptoms and progress in treatment Medication management  Plan:1. Admit for crisis management and stabilization. 2. Medication management to reduce current symptoms to base line and improve the     patient's overall level of functioning 3. Treat health problems as indicated. 4. Develop treatment plan to decrease risk of relapse upon discharge and the need for     readmission. 5. Psycho-social education regarding relapse prevention and self care. 6. Health care follow up as needed for medical problems. 7. Restart home medications where appropriate. 8. Continue Tegretol XR 200mg  po BID for mood lability 9. Initiate Haldol 2mg  po Qhs .   Medical Decision Making Problem Points:  Established problem, worsening (2), Review of last therapy session (1) and Review of psycho-social stressors (1) Data Points:  Order Aims Assessment (2) Review of medication regiment & side effects (2) Review of new medications or change in dosage (2)  I certify that inpatient services furnished can reasonably be expected to improve the patient's condition.   Thedore Mins, MD 09/05/2013, 10:24 AM

## 2013-09-06 NOTE — Progress Notes (Signed)
Adult Psychoeducational Group Note  Date:  09/06/2013 Time:  8:00PM Group Topic/Focus:  Wrap-Up Group:   The focus of this group is to help patients review their daily goal of treatment and discuss progress on daily workbooks.  Participation Level:  Did Not Attend   Additional Comments:  Pt. Didn't attend tonight's music therapy.   Bing Plume D 09/06/2013, 10:15 PM

## 2013-09-06 NOTE — [facility] Group Notes (Signed)
[facility] Group Notes:  (Counselor/Nursing/MHT/Case Management/Adjunct)  09/06/2013 1:15PM  Type of Therapy:  Group Therapy  Did not attend  Summary of Progress/Problems: The topic for group was balance in life.  Pt participated in the discussion about when their life was in balance and out of balance and how this feels.  Pt discussed ways to get back in balance and short term goals they can work on to get where they want to be.     Daryel Gerald B 09/06/2013 4:09 PM

## 2013-09-06 NOTE — Progress Notes (Addendum)
Pt is not attending groups, intrusive, ltd, cooperative with medications, stays on unit, interactive in the milieu, denied SI/HI, denies AVH initially this am then came back later in the day and stated that she needed Zyprexa for agitation because she had been "hearing voices all day long and I haven't wanted to get out of bed."

## 2013-09-06 NOTE — Progress Notes (Signed)
Patient ID: Tami [name], female   DOB: 1991-02-06, 22 y.o.   MRN: [id number]  [facility] MD Progress Note  09/06/2013 2:19 PM Tami [name]  MRN:  [id number] Subjective: Patient states "I guess I'm fine. I don't know." Finley then gave several irrelevant responses to this writer's questions.   Objective:  Patient remains easily agitated, labile, and requires redirection from staff for inappropriate behaviors. She is very guarded during today's assessment not providing information on her progress. Nursing notes reveal patient has been refusing to clean up her room, cursing at staff, and has no insight into her mental illness. Patient is compliant with medication and has not verbalized any adverse affects.   Diagnosis:   DSM5: Schizophrenia Disorders:  none Obsessive-Compulsive Disorders:   Trauma-Stressor Disorders:   Substance/Addictive Disorders:  Cannabis Use Disorder - Moderate 9304.30) Depressive Disorders:  Mood disorder  Axis I: Bipolar disorder-unspecified           Cannabis use disorder.  ADL's:  Intact  Sleep: Good  Appetite:  Fair  Suicidal Ideation: denies Homicidal Ideation: denies   AEB (as evidenced by):  Psychiatric Specialty Exam: Review of Systems  Constitutional: Negative.   HENT: Negative.   Eyes: Negative.   Respiratory: Negative.   Cardiovascular: Negative.   Gastrointestinal: Negative.   Genitourinary: Negative.   Musculoskeletal: Negative.   Skin: Negative.   Neurological: Negative.   Endo/Heme/Allergies: Negative.   Psychiatric/Behavioral: Positive for depression and substance abuse. The patient is nervous/anxious and has insomnia.     Blood pressure 110/58, pulse 72, temperature 97.7 F (36.5 C), temperature source Oral, resp. rate 20, height 4' 9.5" (1.461 m), weight 63.504 kg (140 lb), last menstrual period 07/03/2013.Body mass index is 29.75 kg/(m^2).  General Appearance: Fairly Groomed  Patent attorney::  Minimal  Speech:  Pressured  Volume:   Increased  Mood:  Angry and Irritable  Affect:  Labile and Full Range  Thought Process:  Circumstantial  Orientation:  Full (Time, Place, and Person)  Thought Content:  Negative  Suicidal Thoughts:  No  Homicidal Thoughts:  No  Memory:  Immediate;   Fair Recent;   Fair Remote;   Fair  Judgement:  Poor  Insight:  Lacking  Psychomotor Activity:  Increased  Concentration:  Poor  Recall:  Poor  Akathisia:  No  Handed:  Right  AIMS (if indicated):     Assets:  Physical Health  Sleep:  Number of Hours: 6.5   Current Medications: Current Facility-Administered Medications  Medication Dose Route Frequency Provider Last Rate Last Dose  . acetaminophen (TYLENOL) tablet 650 mg  650 mg Oral Q6H PRN Kerry Hough, PA-C   650 mg at 09/06/13 0805  . alum & mag hydroxide-simeth (MAALOX/MYLANTA) 200-200-20 MG/5ML suspension 30 mL  30 mL Oral Q4H PRN Kerry Hough, PA-C      . carbamazepine (TEGRETOL XR) 12 hr tablet 200 mg  200 mg Oral BID Mojeed Akintayo   200 mg at 09/06/13 0804  . haloperidol (HALDOL) tablet 2 mg  2 mg Oral QHS Mojeed Akintayo   2 mg at 09/05/13 2145  . hydrOXYzine (ATARAX/VISTARIL) tablet 25 mg  25 mg Oral Q6H PRN Fransisca Kaufmann, NP   25 mg at 09/06/13 0805  . magnesium hydroxide (MILK OF MAGNESIA) suspension 30 mL  30 mL Oral Daily PRN Kerry Hough, PA-C      . OLANZapine zydis (ZYPREXA) disintegrating tablet 10 mg  10 mg Oral Q8H PRN Mojeed Akintayo   10 mg at 09/05/13 1025  .  traZODone (DESYREL) tablet 50 mg  50 mg Oral QHS,MR X 1 Kerry Hough, PA-C   50 mg at 09/05/13 2145    Lab Results: No results found for this or any previous visit (from the past 48 hour(s)).  Physical Findings: AIMS: Facial and Oral Movements Muscles of Facial Expression: None, normal Lips and Perioral Area: None, normal Jaw: None, normal Tongue: None, normal,Extremity Movements Upper (arms, wrists, hands, fingers): None, normal Lower (legs, knees, ankles, toes): None, normal, Trunk  Movements Neck, shoulders, hips: None, normal, Overall Severity Severity of abnormal movements (highest score from questions above): None, normal Incapacitation due to abnormal movements: None, normal Patient's awareness of abnormal movements (rate only patient's report): No Awareness, Dental Status Current problems with teeth and/or dentures?: No Does patient usually wear dentures?: No  CIWA:    COWS:     Treatment Plan Summary: Daily contact with patient to assess and evaluate symptoms and progress in treatment Medication management  Plan:1. Continue crisis management and stabilization. 2. Medication management to reduce current symptoms to base line and improve the  patient's overall level of functioning. Continue Zyprexa Zydis 10 mg every eight hours for agitation and Vistaril 25 mg every six hours as needed for anxiety.  3. Treat health problems as indicated. 4. Develop treatment plan to decrease risk of relapse upon discharge and the need for readmission. 5. Psycho-social education regarding relapse prevention and self care. 6. Health care follow up as needed for medical problems. 7. Restart home medications where appropriate. 8. Continue Tegretol XR 200mg  po BID for mood lability.  9. Cotninue Haldol 2mg  po Qhs for behavioral problems.   Medical Decision Making Problem Points:  Established problem, improving (1), Review of last therapy session (1) and Review of psycho-social stressors (1) Data Points:  Order Aims Assessment (2) Review of medication regiment & side effects (2) Review of new medications or change in dosage (2)  I certify that inpatient services furnished can reasonably be expected to improve the patient's condition.   Fransisca Kaufmann, NP-C 09/06/2013, 2:19 PM

## 2013-09-06 NOTE — Progress Notes (Addendum)
D: Pt denies SI/HI/AVH. Pt is  cooperative. Pt remained in room most of the night. Pt was a little agitated when writer refused to give pt another snack, but pt calmed down quickly. Pt states I get "ill easily", and writer told her that there are consequences for her actions and that maybe she needs to stop and think before she reacts in the future. Pt appears to be in denial about her present situation and minimizes her stay here at [facility].   A: Pt was offered support and encouragement. Pt was given scheduled medications.Q 15 minute checks were done for safety.  R: Pt is taking medication.Pt receptive to treatment and safety maintained on unit.

## 2013-09-06 NOTE — ED Provider Notes (Signed)
Medical screening examination/treatment/procedure(s) were performed by non-physician practitioner and as supervising physician I was immediately available for consultation/collaboration.  EKG Interpretation   None        Doug Sou, MD 09/06/13 1303

## 2013-09-06 NOTE — Clinical Social Work Note (Signed)
Have been unable to do PSA due to patient's mood lability and uncooperative nature.

## 2013-09-07 DIAGNOSIS — F29 Unspecified psychosis not due to a substance or known physiological condition: Secondary | ICD-10-CM

## 2013-09-07 DIAGNOSIS — F1210 Cannabis abuse, uncomplicated: Secondary | ICD-10-CM

## 2013-09-07 NOTE — Progress Notes (Signed)
Seen and agreed. Adline Kirshenbaum, MD 

## 2013-09-07 NOTE — Progress Notes (Signed)
Recreation Therapy Notes  Date: 11.07.2014 Time: 9:30am Location: [address] Dayroom  Group Topic: Leisure Education  Goal Area(s) Addresses:  Patient will verbalize benefit of leisure. Patient will identify positive emotions associated with leisure.  Behavioral Response: Did not attend.   Marykay Lex Saxton Chain, LRT/CTRS  Dulcemaria Bula L 09/07/2013 10:09 AM

## 2013-09-07 NOTE — [facility] Group Notes (Signed)
[facility] LCSW Group Therapy  09/07/2013  1:05 PM  Type of Therapy:  Group therapy  Participation Level:  Minimal   Participation Quality:  Attentive  Affect:  Flat  Cognitive:  Oriented  Insight:  Limited  Engagement in Therapy:  Limited  Modes of Intervention:  Discussion, Socialization  Summary of Progress/Problems:  Chaplain was here to lead a group on theme of community.  She sat quietly while another group member did her hair.  She did not actively contribute to anything meaningful during group discussion.     Daryel Gerald B 09/07/2013 1:01 PM

## 2013-09-07 NOTE — Progress Notes (Signed)
Patient ID: Tami [name], female   DOB: 31-Dec-1990, 22 y.o.   MRN: [id number] [facility] MD Progress Note  09/07/2013 10:08 AM Tami [name]  MRN:  [id number] Subjective: " I am still hearing voices." Objective: Patient reports ongoing auditory hallucinations. Her behavior remains  bizarre and disorganized. She is hypersexual and has been wearing provocative dresses. She gets easily agitated, irritable and  has difficulty processing information. She is compliant with her medications and has not endorsed any adverse reactions to her medications.  Diagnosis:   DSM5: Schizophrenia Disorders: psychosis Obsessive-Compulsive Disorders:   Trauma-Stressor Disorders:   Substance/Addictive Disorders:  Cannabis Use Disorder - Moderate 9304.30) Depressive Disorders:  Mood disorder  Axis I: Bipolar disorder-unspecified            Unspecified Psychosis.           Cannabis use disorder.  ADL's:  Intact  Sleep: Fair  Appetite:  Fair  Suicidal Ideation: denies Homicidal Ideation: denies   AEB (as evidenced by):  Psychiatric Specialty Exam: Review of Systems  Constitutional: Negative.   HENT: Negative.   Eyes: Negative.   Respiratory: Negative.   Cardiovascular: Negative.   Gastrointestinal: Negative.   Genitourinary: Negative.   Musculoskeletal: Negative.   Skin: Negative.   Neurological: Negative.   Endo/Heme/Allergies: Negative.   Psychiatric/Behavioral: The patient is nervous/anxious.     Blood pressure 110/58, pulse 72, temperature 97.7 F (36.5 C), temperature source Oral, resp. rate 20, height 4' 9.5" (1.461 m), weight 63.504 kg (140 lb), last menstrual period 07/03/2013.Body mass index is 29.75 kg/(m^2).  General Appearance: Fairly Groomed  Patent attorney::  Minimal  Speech:  Pressured  Volume:  Increased  Mood:  Angry and Irritable  Affect:  Labile and Full Range  Thought Process:  Circumstantial  Orientation:  Full (Time, Place, and Person)  Thought Content:  Negative  Suicidal  Thoughts:  No  Homicidal Thoughts:  No  Memory:  Immediate;   Fair Recent;   Fair Remote;   Fair  Judgement:  Poor  Insight:  Lacking  Psychomotor Activity:  Increased  Concentration:  Poor  Recall:  Poor  Akathisia:  No  Handed:  Right  AIMS (if indicated):     Assets:  Physical Health  Sleep:  Number of Hours: 5.75   Current Medications: Current Facility-Administered Medications  Medication Dose Route Frequency Provider Last Rate Last Dose  . acetaminophen (TYLENOL) tablet 650 mg  650 mg Oral Q6H PRN Kerry Hough, PA-C   650 mg at 09/06/13 1701  . alum & mag hydroxide-simeth (MAALOX/MYLANTA) 200-200-20 MG/5ML suspension 30 mL  30 mL Oral Q4H PRN Kerry Hough, PA-C      . carbamazepine (TEGRETOL XR) 12 hr tablet 200 mg  200 mg Oral BID Tami [name]   200 mg at 09/07/13 0828  . haloperidol (HALDOL) tablet 2 mg  2 mg Oral QHS Tami [name]   2 mg at 09/06/13 2302  . hydrOXYzine (ATARAX/VISTARIL) tablet 25 mg  25 mg Oral Q6H PRN Tami Kaufmann, NP   25 mg at 09/06/13 0805  . magnesium hydroxide (MILK OF MAGNESIA) suspension 30 mL  30 mL Oral Daily PRN Kerry Hough, PA-C      . OLANZapine zydis (ZYPREXA) disintegrating tablet 10 mg  10 mg Oral Q8H PRN Tami [name]   10 mg at 09/06/13 1701  . traZODone (DESYREL) tablet 50 mg  50 mg Oral QHS,MR X 1 Kerry Hough, PA-C   50 mg at 09/06/13 2304  Lab Results: No results found for this or any previous visit (from the past 48 hour(s)).  Physical Findings: AIMS: Facial and Oral Movements Muscles of Facial Expression: None, normal Lips and Perioral Area: None, normal Jaw: None, normal Tongue: None, normal,Extremity Movements Upper (arms, wrists, hands, fingers): None, normal Lower (legs, knees, ankles, toes): None, normal, Trunk Movements Neck, shoulders, hips: None, normal, Overall Severity Severity of abnormal movements (highest score from questions above): None, normal Incapacitation due to abnormal movements: None,  normal Patient's awareness of abnormal movements (rate only patient's report): No Awareness, Dental Status Current problems with teeth and/or dentures?: No Does patient usually wear dentures?: No  CIWA:    COWS:     Treatment Plan Summary: Daily contact with patient to assess and evaluate symptoms and progress in treatment Medication management  Plan:1. Admit for crisis management and stabilization. 2. Medication management to reduce current symptoms to base line and improve the   patient's overall level of functioning 3. Treat health problems as indicated. 4. Develop treatment plan to decrease risk of relapse upon discharge and the need for readmission. 5. Psycho-social education regarding relapse prevention and self care. 6. Health care follow up as needed for medical problems. 7. Restart home medications where appropriate. 8. Continue Tegretol XR 200mg  po BID for mood lability 9. Continue Haldol 2mg  po Qhs for psychosis. 10. Carbamazepine level on 09/10/13.   Medical Decision Making Problem Points:  Established problem, worsening (2), Review of last therapy session (1) and Review of psycho-social stressors (1) Data Points:  Order Aims Assessment (2) Review of medication regiment & side effects (2) Review of new medications or change in dosage (2)  I certify that inpatient services furnished can reasonably be expected to improve the patient's condition.   Thedore Mins, MD 09/07/2013, 10:08 AM

## 2013-09-08 NOTE — Progress Notes (Signed)
Patient ID: Tami [name], female   DOB: [birth year], 22 y.o.   MRN: [id number] Psychoeducational Group Note  Date:  09/08/2013 Time:0930am  Group Topic/Focus:  Identifying Needs:   The focus of this group is to help patients identify their personal needs that have been historically problematic and identify healthy behaviors to address their needs.  Participation Level:  Did Not Attend  Participation Quality:    Affect:  Cognitive: Insight:  Engagement in Group:  Additional Comments:  Inventory and Psychoeducational group   Valente David 09/08/2013,10:19 AM

## 2013-09-08 NOTE — Progress Notes (Signed)
Did not attend group 

## 2013-09-08 NOTE — Progress Notes (Signed)
Patient ID: Tami [name], female   DOB: 1991-01-10, 22 y.o.   MRN: [id number]  [facility] MD Progress Note  09/08/2013 2:22 PM Tami [name]  MRN:  [id number] Subjective: Patient states "I'm fine. Can I get something to eat?"  Objective: Patient continues to have no insight into her reasons for being in the hospital. Her behavior remains bizarre and disorganized. She gets easily agitated, irritable and has difficulty processing information. Nursing staff continue to report episodes of agitation but there appears to be improvement in patient's ability to calm down with staff assistance.  She is compliant with her medications and has not endorsed any adverse reactions to her medications.   Diagnosis:   DSM5: Schizophrenia Disorders: psychosis Obsessive-Compulsive Disorders:   Trauma-Stressor Disorders:   Substance/Addictive Disorders:  Cannabis Use Disorder - Moderate 9304.30) Depressive Disorders:  Mood disorder  Axis I: Bipolar disorder-unspecified            Unspecified Psychosis.           Cannabis use disorder.  ADL's:  Intact  Sleep: Fair  Appetite:  Fair  Suicidal Ideation: denies Homicidal Ideation: denies   AEB (as evidenced by):  Psychiatric Specialty Exam: Review of Systems  Constitutional: Negative.   HENT: Negative.   Eyes: Negative.   Respiratory: Negative.   Cardiovascular: Negative.   Gastrointestinal: Negative.   Genitourinary: Negative.   Musculoskeletal: Negative.   Skin: Negative.   Neurological: Negative.   Endo/Heme/Allergies: Negative.   Psychiatric/Behavioral: Positive for depression, hallucinations and substance abuse. The patient is nervous/anxious.     Blood pressure 99/87, pulse 87, temperature 97.6 F (36.4 C), temperature source Oral, resp. rate 18, height 4' 9.5" (1.461 m), weight 63.504 kg (140 lb), last menstrual period 07/03/2013.Body mass index is 29.75 kg/(m^2).  General Appearance: Fairly Groomed  Patent attorney::  Minimal  Speech:   Pressured  Volume:  Increased  Mood:  Angry and Irritable  Affect:  Labile and Full Range  Thought Process:  Circumstantial  Orientation:  Full (Time, Place, and Person)  Thought Content:  Negative  Suicidal Thoughts:  No  Homicidal Thoughts:  No  Memory:  Immediate;   Fair Recent;   Fair Remote;   Fair  Judgement:  Poor  Insight:  Lacking  Psychomotor Activity:  Increased  Concentration:  Poor  Recall:  Poor  Akathisia:  No  Handed:  Right  AIMS (if indicated):     Assets:  Physical Health  Sleep:  Number of Hours: 5.25   Current Medications: Current Facility-Administered Medications  Medication Dose Route Frequency Provider Last Rate Last Dose  . acetaminophen (TYLENOL) tablet 650 mg  650 mg Oral Q6H PRN Kerry Hough, PA-C   650 mg at 09/06/13 1701  . alum & mag hydroxide-simeth (MAALOX/MYLANTA) 200-200-20 MG/5ML suspension 30 mL  30 mL Oral Q4H PRN Kerry Hough, PA-C      . carbamazepine (TEGRETOL XR) 12 hr tablet 200 mg  200 mg Oral BID Mojeed Akintayo   200 mg at 09/08/13 0819  . haloperidol (HALDOL) tablet 2 mg  2 mg Oral QHS Mojeed Akintayo   2 mg at 09/07/13 2207  . hydrOXYzine (ATARAX/VISTARIL) tablet 25 mg  25 mg Oral Q6H PRN Fransisca Kaufmann, NP   25 mg at 09/06/13 0805  . magnesium hydroxide (MILK OF MAGNESIA) suspension 30 mL  30 mL Oral Daily PRN Kerry Hough, PA-C      . OLANZapine zydis (ZYPREXA) disintegrating tablet 10 mg  10 mg Oral Q8H PRN Mojeed  Akintayo   10 mg at 09/08/13 0819  . traZODone (DESYREL) tablet 50 mg  50 mg Oral QHS,MR X 1 Kerry Hough, PA-C   50 mg at 09/07/13 2207    Lab Results: No results found for this or any previous visit (from the past 48 hour(s)).  Physical Findings: AIMS: Facial and Oral Movements Muscles of Facial Expression: None, normal Lips and Perioral Area: None, normal Jaw: None, normal Tongue: None, normal,Extremity Movements Upper (arms, wrists, hands, fingers): None, normal Lower (legs, knees, ankles, toes):  None, normal, Trunk Movements Neck, shoulders, hips: None, normal, Overall Severity Severity of abnormal movements (highest score from questions above): None, normal Incapacitation due to abnormal movements: None, normal Patient's awareness of abnormal movements (rate only patient's report): No Awareness, Dental Status Current problems with teeth and/or dentures?: No Does patient usually wear dentures?: No  CIWA:    COWS:     Treatment Plan Summary: Daily contact with patient to assess and evaluate symptoms and progress in treatment Medication management  Plan:1. Continue crisis management and stabilization. 2. Medication management to reduce current symptoms to base line and improve the patient's overall level of functioning 3. Treat health problems as indicated. 4. Develop treatment plan to decrease risk of relapse upon discharge and the need for readmission. 5. Psycho-social education regarding relapse prevention and self care. 6. Health care follow up as needed for medical problems. 7. Restart home medications where appropriate. 8. Continue Tegretol XR 200mg  po BID for mood lability 9. Continue Haldol 2mg  po Qhs for psychosis. 10. Carbamazepine level on 09/10/13.  Medical Decision Making Problem Points:  Established problem, worsening (2), Review of last therapy session (1) and Review of psycho-social stressors (1) Data Points:  Order Aims Assessment (2) Review of medication regiment & side effects (2) Review of new medications or change in dosage (2)  I certify that inpatient services furnished can reasonably be expected to improve the patient's condition.   Fransisca Kaufmann, NP-C 09/08/2013, 2:22 PM  Reviewed the information documented and agree with the treatment plan.  Kaylor Simenson,JANARDHAHA R. 09/09/2013 3:18 PM

## 2013-09-08 NOTE — Progress Notes (Signed)
D: Pt denies SI/HI/AVH. Pt is pleasant and cooperative. Pt continues to be a little agitated, but is redirectable. Pt stayed in the bed most of the night.   A: Pt was offered support and encouragement. Pt was given scheduled medications. Pt was encourage to attend groups. Q 15 minute checks were done for safety.   R: Pt is taking medication. Pt has no complaints at this time.Pt receptive to treatment and safety maintained on unit.

## 2013-09-08 NOTE — Progress Notes (Signed)
Patient ID: Tami [name], female   DOB: [birth year], 22 y.o.   MRN: [id number] 09-08-13 nursing shift note: D: Pt has been in her room most of the day. RN administered some zyprexa at [phone] for agitation. She stated she is still hearing "voices". She did not fill out her inventory sheet.  A: staff has continuously encouraged and been supportive of this patient. R:she denied any si/hi.  rn will monitor and q 15 min cks continue.

## 2013-09-09 MED ORDER — HALOPERIDOL 5 MG PO TABS
5.0000 mg | ORAL_TABLET | Freq: Every day | ORAL | Status: DC
  Administered 2013-09-09 – 2013-09-10 (×2): 5 mg via ORAL
  Filled 2013-09-09 (×4): qty 1

## 2013-09-09 MED ORDER — BENZTROPINE MESYLATE 0.5 MG PO TABS
0.5000 mg | ORAL_TABLET | Freq: Every day | ORAL | Status: DC
  Administered 2013-09-09 – 2013-09-10 (×2): 0.5 mg via ORAL
  Filled 2013-09-09 (×4): qty 1

## 2013-09-09 NOTE — Progress Notes (Signed)
D: Pt has been sleeping most of tonight. She woke up to take her medications and to talk to this Clinical research associate. She denies SI/HI/AVH. She has been pleasant and cooperative. A: Support given. Verbalization encouraged. Pt encouraged to come to staff with any concerns. Medications given as prescribed. R: Pt is receptive. No complaints of pain or discomfort at this time. Q15 min checks maintained for safety. Will continue to monitor pt.

## 2013-09-09 NOTE — Progress Notes (Signed)
Patient ID: Roylene Reason, female   DOB: Jul 04, 1991, 22 y.o.   MRN: [id number] Psychoeducational Group Note  Date:  09/09/2013 Time:  1515 pm  Group Topic/Focus:  Making Healthy Choices:   The focus of this group is to help patients identify negative/unhealthy choices they were using prior to admission and identify positive/healthier coping strategies to replace them upon discharge.  Participation Level:  Did Not Attend  Participation Quality:    Affect:  Cognitive: Insight: Engagement in Group:  Additional Comments:  Psychoeducational group- came and left the group early.   Valente David 09/09/2013,4:35 PM

## 2013-09-09 NOTE — [facility] Group Notes (Signed)
[facility] Group Notes: (Clinical Social Work)   09/09/2013      Type of Therapy:  Group Therapy   Participation Level:  Did Not Attend    Ambrose Mantle, LCSW 09/09/2013, 12:49 PM

## 2013-09-09 NOTE — Progress Notes (Signed)
Patient ID: Tami [name], female   DOB: [birth year], 22 y.o.   MRN: [id number]  [facility] MD Progress Note  09/09/2013 12:15 PM Tami [name]  MRN:  [id number] Subjective: Patient states "I don't have any issues."   Objective: Patient continues to have no insight into her reasons for being in the hospital. Her behavior remains bizarre and disorganized. She gets easily agitated, irritable and has difficulty processing information. Phaedra denies any concerns to Clinical research associate but nursing notes reports she is still complaining of hearing voices and receiving zyprexa prn for agitation. Patient is observed to be staying in her room a great deal during the day.   Diagnosis:   DSM5: Schizophrenia Disorders: psychosis Obsessive-Compulsive Disorders:   Trauma-Stressor Disorders:   Substance/Addictive Disorders:  Cannabis Use Disorder - Moderate 9304.30) Depressive Disorders:  Mood disorder  Axis I: Bipolar disorder-unspecified            Unspecified Psychosis.           Cannabis use disorder.  ADL's:  Intact  Sleep: Fair  Appetite:  Fair  Suicidal Ideation: denies Homicidal Ideation: denies   AEB (as evidenced by):  Psychiatric Specialty Exam: Review of Systems  Constitutional: Negative.   HENT: Negative.   Eyes: Negative.   Respiratory: Negative.   Cardiovascular: Negative.   Gastrointestinal: Negative.   Genitourinary: Negative.   Musculoskeletal: Negative.   Skin: Negative.   Neurological: Negative.   Endo/Heme/Allergies: Negative.   Psychiatric/Behavioral: Positive for depression, hallucinations and substance abuse. Negative for suicidal ideas and memory loss. The patient is nervous/anxious. The patient does not have insomnia.     Blood pressure 113/77, pulse 94, temperature 98 F (36.7 C), temperature source Oral, resp. rate 16, height 4' 9.5" (1.461 m), weight 63.504 kg (140 lb), last menstrual period 07/03/2013, SpO2 98.00%.Body mass index is 29.75 kg/(m^2).  General Appearance:  Fairly Groomed  Patent attorney::  Minimal  Speech:  Pressured  Volume:  Increased  Mood:  Angry and Irritable  Affect:  Labile and Full Range  Thought Process:  Circumstantial  Orientation:  Full (Time, Place, and Person)  Thought Content:  Negative  Suicidal Thoughts:  No  Homicidal Thoughts:  No  Memory:  Immediate;   Fair Recent;   Fair Remote;   Fair  Judgement:  Poor  Insight:  Lacking  Psychomotor Activity:  Increased  Concentration:  Poor  Recall:  Poor  Akathisia:  No  Handed:  Right  AIMS (if indicated):     Assets:  Physical Health  Sleep:  Number of Hours: 6.25   Current Medications: Current Facility-Administered Medications  Medication Dose Route Frequency Provider Last Rate Last Dose  . acetaminophen (TYLENOL) tablet 650 mg  650 mg Oral Q6H PRN Kerry Hough, PA-C   650 mg at 09/06/13 1701  . alum & mag hydroxide-simeth (MAALOX/MYLANTA) 200-200-20 MG/5ML suspension 30 mL  30 mL Oral Q4H PRN Kerry Hough, PA-C      . carbamazepine (TEGRETOL XR) 12 hr tablet 200 mg  200 mg Oral BID Mojeed Akintayo   200 mg at 09/09/13 0815  . haloperidol (HALDOL) tablet 2 mg  2 mg Oral QHS Mojeed Akintayo   2 mg at 09/08/13 2128  . hydrOXYzine (ATARAX/VISTARIL) tablet 25 mg  25 mg Oral Q6H PRN Fransisca Kaufmann, NP   25 mg at 09/06/13 0805  . magnesium hydroxide (MILK OF MAGNESIA) suspension 30 mL  30 mL Oral Daily PRN Kerry Hough, PA-C      . OLANZapine zydis (ZYPREXA)  disintegrating tablet 10 mg  10 mg Oral Q8H PRN Mojeed Akintayo   10 mg at 09/08/13 1630  . traZODone (DESYREL) tablet 50 mg  50 mg Oral QHS,MR X 1 Kerry Hough, PA-C   50 mg at 09/08/13 2128    Lab Results: No results found for this or any previous visit (from the past 48 hour(s)).  Physical Findings: AIMS: Facial and Oral Movements Muscles of Facial Expression: None, normal Lips and Perioral Area: None, normal Jaw: None, normal Tongue: None, normal,Extremity Movements Upper (arms, wrists, hands, fingers):  None, normal Lower (legs, knees, ankles, toes): None, normal, Trunk Movements Neck, shoulders, hips: None, normal, Overall Severity Severity of abnormal movements (highest score from questions above): None, normal Incapacitation due to abnormal movements: None, normal Patient's awareness of abnormal movements (rate only patient's report): No Awareness, Dental Status Current problems with teeth and/or dentures?: No Does patient usually wear dentures?: No  CIWA:    COWS:     Treatment Plan Summary: Daily contact with patient to assess and evaluate symptoms and progress in treatment Medication management  Plan:1. Continue crisis management and stabilization. 2. Medication management to reduce current symptoms to base line and improve the patient's overall level of functioning 3. Treat health problems as indicated. 4. Develop treatment plan to decrease risk of relapse upon discharge and the need for readmission. 5. Psycho-social education regarding relapse prevention and self care. 6. Health care follow up as needed for medical problems. 7. Restart home medications where appropriate. 8. Continue Tegretol XR 200mg  po BID for mood lability 9. Increase  Haldol 5 mg po Qhs for psychosis. Start Cogentin 0.5 mg at hs for EPS prevention.  10. Carbamazepine level on 09/10/13.  Medical Decision Making Problem Points:  Established problem, worsening (2), Review of last therapy session (1) and Review of psycho-social stressors (1) Data Points:  Order Aims Assessment (2) Review of medication regiment & side effects (2) Review of new medications or change in dosage (2)  I certify that inpatient services furnished can reasonably be expected to improve the patient's condition.   Fransisca Kaufmann, NP-C 09/09/2013, 12:15 PM  Reviewed the information documented and agree with the treatment plan.  Tami [name],JANARDHAHA R. 09/09/2013 3:35 PM

## 2013-09-09 NOTE — Progress Notes (Signed)
D: pt. In room with lights off and heat blasting. Pt stated that today has been good. Pt stated that she is having racing thoughts that say " I'm going to die and my whole family is going to die." Pt. Denies SI/HI. Pt. Stated that her stomach hurt and they she has a headache 5/10. Pt. Is wide eyed and childlike. Calm and cooperative. Pt. Stated she wants to go live with her uncle upon d/c and wants a prescription for adderall.  A: Scheduled and prn meds given and followed up. Support and encouragement offered. q 15 min safety checks.pt. Contracts for safety R: pt. remains safe on unit.

## 2013-09-09 NOTE — Progress Notes (Signed)
Patient ID: Tami [name], female   DOB: Jan 27, 1991, 22 y.o.   MRN: [id number] Psychoeducational Group Note  Date:  09/09/2013 Time:  0915am  Group Topic/Focus:  Making Healthy Choices:   The focus of this group is to help patients identify negative/unhealthy choices they were using prior to admission and identify positive/healthier coping strategies to replace them upon discharge.  Participation Level:  Did Not Attend  Participation Quality:    Affect:  Cognitive: Insight:  Engagement in Group:  Additional Comments:  Inventory and Psychoeducational group   Valente David 09/09/2013,9:43 AM

## 2013-09-09 NOTE — [facility] Group Notes (Signed)
Adult Psychoeducational Group Note  Date:  09/09/2013 Time:  9:30 PM  Group Topic/Focus:  Wrap-Up Group:   The focus of this group is to help patients review their daily goal of treatment and discuss progress on daily workbooks.  Participation Level:  Did Not Attend  Participation Quality:  None  Affect:  None  Cognitive:  None  Insight: None  Engagement in Group:  None  Modes of Intervention:  Discussion  Additional Comments:  Rahmah did not attend group.  Caroll Rancher A 09/09/2013, 9:30 PM

## 2013-09-10 LAB — CARBAMAZEPINE LEVEL, TOTAL: Carbamazepine Lvl: 9.8 ug/mL (ref 4.0–12.0)

## 2013-09-10 MED ORDER — CARBAMAZEPINE ER 200 MG PO TB12
400.0000 mg | ORAL_TABLET | Freq: Two times a day (BID) | ORAL | Status: DC
  Administered 2013-09-10 – 2013-09-11 (×2): 400 mg via ORAL
  Filled 2013-09-10 (×2): qty 1
  Filled 2013-09-10: qty 2
  Filled 2013-09-10 (×2): qty 1

## 2013-09-10 NOTE — Progress Notes (Signed)
D: Pt presents anxious and childlike. Pt easily agitated and irritable around noon because she could not go home today. Pt given prn Zyprexa for agitation. Pt continues to be hypersexual on the unit and enjoys showing her body parts. Pt continues to need redirecting by staff to pull up her pants and to pull down her shirt. Pt asked by MHT to change her shirt. Pt was cooperative and allowed to go off the unit for lunch. A: Medications administered as ordered per MD. Verbal support given. Pt encouraged to attend groups. 15 minute checks performed for safety. R: Pt safety maintained.

## 2013-09-10 NOTE — Progress Notes (Signed)
Recreation Therapy Notes  Date: 11.10.2014 Time: 9:30am Location: [address] Dayroom   Group Topic: Self-Esteem  Goal Area(s) Addresses:  Patient will define self-esteem.  Patient will identify how self-esteem effects his/her life.   Behavioral Response: Engaged, Inappropriate   Intervention: Designer, multimedia.   Activity: Patient was asked to select a question from provided container, provide an answer and given the opportunity to ask a group member to answer the selected question.   Education:  Self-Esteem, Scientist, physiological, Discharge Planning.   Education Outcome: Needs additional education  Clinical Observations/Feedback: Patient participated in by defining self-esteem, when asked to answer the selected question patient did so by attributing her self-esteem to drugs. Initially stating she feels good about herself and she smokes a blunt, then transitioning in to "doing powder" and finally patient stated that when she does pills she feels most confident. LRT encouraged patient to identify a personal attribute that makes her feel confident in herself, however she was not able to do so. Patient left group session at approximately 9:55am and did not return.   Marykay Lex Romen Yutzy, LRT/CTRS  Ta Fair L 09/10/2013 11:38 AM

## 2013-09-10 NOTE — Progress Notes (Signed)
Patient ID: Tami [name], female   DOB: [birth year], 22 y.o.   MRN: [id number]  [facility] MD Progress Note  09/10/2013 9:46 AM Krisann Mckenna  MRN:  [id number] Subjective: "I am still having racing thoughts."   Objective: Patient reports ongoing racing thoughts but staffs says she has been less agitated and irritable. However, she thinks her medications are working well. She has been acting less bizarre and her thoughts is getting more organized. She is compliant with her medications and has not endorsed any adverse reactions. Diagnosis:   DSM5: Schizophrenia Disorders: psychosis Obsessive-Compulsive Disorders:   Trauma-Stressor Disorders:   Substance/Addictive Disorders:  Cannabis Use Disorder - Moderate 9304.30) Depressive Disorders:  Mood disorder  Axis I: Bipolar disorder-unspecified            Unspecified Psychosis.           Cannabis use disorder.  ADL's:  Intact  Sleep: Fair  Appetite:  Fair  Suicidal Ideation: denies Homicidal Ideation: denies   AEB (as evidenced by):  Psychiatric Specialty Exam: Review of Systems  Constitutional: Negative.   HENT: Negative.   Eyes: Negative.   Respiratory: Negative.   Cardiovascular: Negative.   Gastrointestinal: Negative.   Genitourinary: Negative.   Musculoskeletal: Negative.   Skin: Negative.   Neurological: Negative.   Endo/Heme/Allergies: Negative.   Psychiatric/Behavioral: Positive for depression, hallucinations and substance abuse. Negative for suicidal ideas and memory loss. The patient is nervous/anxious. The patient does not have insomnia.     Blood pressure 109/67, pulse 80, temperature 97.1 F (36.2 C), temperature source Oral, resp. rate 18, height 4' 9.5" (1.461 m), weight 63.504 kg (140 lb), last menstrual period 07/03/2013, SpO2 98.00%.Body mass index is 29.75 kg/(m^2).  General Appearance: Fairly Groomed  Patent attorney::  Minimal  Speech:  Pressured  Volume: loud  Mood:  Angry   Affect: Full Range  Thought  Process:  Circumstantial  Orientation:  Full (Time, Place, and Person)  Thought Content:  Negative  Suicidal Thoughts:  No  Homicidal Thoughts:  No  Memory:  Immediate;   Fair Recent;   Fair Remote;   Fair  Judgement: shallow  Insight:  Lacking  Psychomotor Activity:  Increased  Concentration: fair  Recall:  Poor  Akathisia:  No  Handed:  Right  AIMS (if indicated):     Assets:  Physical Health  Sleep:  Number of Hours: 4   Current Medications: Current Facility-Administered Medications  Medication Dose Route Frequency Provider Last Rate Last Dose  . acetaminophen (TYLENOL) tablet 650 mg  650 mg Oral Q6H PRN Kerry Hough, PA-C   650 mg at 09/09/13 2000  . alum & mag hydroxide-simeth (MAALOX/MYLANTA) 200-200-20 MG/5ML suspension 30 mL  30 mL Oral Q4H PRN Kerry Hough, PA-C      . benztropine (COGENTIN) tablet 0.5 mg  0.5 mg Oral QHS Fransisca Kaufmann, NP   0.5 mg at 09/09/13 2146  . carbamazepine (TEGRETOL XR) 12 hr tablet 400 mg  400 mg Oral BID Ilse Billman      . haloperidol (HALDOL) tablet 5 mg  5 mg Oral QHS Fransisca Kaufmann, NP   5 mg at 09/09/13 2146  . hydrOXYzine (ATARAX/VISTARIL) tablet 25 mg  25 mg Oral Q6H PRN Fransisca Kaufmann, NP   25 mg at 09/06/13 0805  . magnesium hydroxide (MILK OF MAGNESIA) suspension 30 mL  30 mL Oral Daily PRN Kerry Hough, PA-C      . OLANZapine zydis (ZYPREXA) disintegrating tablet 10 mg  10 mg Oral Q8H  PRN Ambri Miltner   10 mg at 09/09/13 1653  . traZODone (DESYREL) tablet 50 mg  50 mg Oral QHS,MR X 1 Kerry Hough, PA-C   50 mg at 09/09/13 2146    Lab Results:  Results for orders placed during the hospital encounter of 09/03/13 (from the past 48 hour(s))  CARBAMAZEPINE LEVEL, TOTAL     Status: None   Collection Time    09/10/13  6:28 AM      Result Value Range   Carbamazepine Lvl 9.8  4.0 - 12.0 ug/mL   Comment: Performed at [facility]    Physical Findings: AIMS: Facial and Oral Movements Muscles of Facial Expression: None,  normal Lips and Perioral Area: None, normal Jaw: None, normal Tongue: None, normal,Extremity Movements Upper (arms, wrists, hands, fingers): None, normal Lower (legs, knees, ankles, toes): None, normal, Trunk Movements Neck, shoulders, hips: None, normal, Overall Severity Severity of abnormal movements (highest score from questions above): None, normal Incapacitation due to abnormal movements: None, normal Patient's awareness of abnormal movements (rate only patient's report): No Awareness, Dental Status Current problems with teeth and/or dentures?: No Does patient usually wear dentures?: No  CIWA:    COWS:     Treatment Plan Summary: Daily contact with patient to assess and evaluate symptoms and progress in treatment Medication management  Plan:1. Continue crisis management and stabilization. 2. Medication management to reduce current symptoms to base line and improve the patient's overall level of functioning 3. Treat health problems as indicated. 4. Develop treatment plan to decrease risk of relapse upon discharge and the need for readmission. 5. Psycho-social education regarding relapse prevention and self care. 6. Health care follow up as needed for medical problems. 7. Restart home medications where appropriate. 8. Increase Tegretol XR to 400mg  po BID for mood lability 9. Continue  Haldol 5 mg po Qhs for psychosis. Continue Cogentin 0.5 mg at hs for EPS prevention.  10. Carbamazepine level on 09/11/13.  Medical Decision Making Problem Points:  Established problem, improving (1), Review of last therapy session (1) and Review of psycho-social stressors (1) Data Points:  Order Aims Assessment (2) Review of medication regiment & side effects (2) Review of new medications or change in dosage (2)  I certify that inpatient services furnished can reasonably be expected to improve the patient's condition.   Thedore Mins, MD 09/10/2013, 9:46 AM

## 2013-09-10 NOTE — [facility] Group Notes (Signed)
Adult Psychoeducational Group Note  Date:  09/10/2013 Time:  9:50 PM  Group Topic/Focus:  Wrap-Up Group:   The focus of this group is to help patients review their daily goal of treatment and discuss progress on daily workbooks.  Participation Level:  Did Not Attend  Participation Quality:  None  Affect:  None  Cognitive:  None  Insight: None  Engagement in Group:  None  Modes of Intervention:  Discussion  Additional Comments:  Jyoti did not attend group.  Caroll Rancher A 09/10/2013, 9:50 PM

## 2013-09-10 NOTE — [facility] Group Notes (Signed)
[facility] LCSW Aftercare Discharge Planning Group Note   09/10/2013 8:09 AM  Participation Quality:  Did not attend      [place]

## 2013-09-10 NOTE — Tx Team (Signed)
  Interdisciplinary Treatment Plan Update   Date Reviewed:  09/10/2013  Time Reviewed:  10:18 AM  Progress in Treatment:   Attending groups: Yes Participating in groups: Yes Taking medication as prescribed: Yes  Tolerating medication: Yes Family/Significant other contact made: Yes  Patient understands diagnosis: Yes  Discussing patient identified problems/goals with staff: Yes Medical problems stabilized or resolved: Yes Denies suicidal/homicidal ideation: Yes Patient has not harmed self or others: Yes  For review of initial/current patient goals, please see plan of care.  Estimated Length of Stay:  Likely d/c to tomorrow  Reason for Continuation of Hospitalization:   New Problems/Goals identified:  N/A  Discharge Plan or Barriers:   return to [place] to stay with friend, follow up at [facility]  Additional Comments:  Attendees:  Signature: Thedore Mins, MD 09/10/2013 10:18 AM   Signature: Richelle Ito, LCSW 09/10/2013 10:18 AM  Signature: Fransisca Kaufmann, NP 09/10/2013 10:18 AM  Signature: Joslyn Devon, RN 09/10/2013 10:18 AM  Signature: Liborio Nixon, RN 09/10/2013 10:18 AM  Signature:  09/10/2013 10:18 AM  Signature:   09/10/2013 10:18 AM  Signature:    Signature:    Signature:    Signature:    Signature:    Signature:      Scribe for Treatment Team:   Richelle Ito, LCSW  09/10/2013 10:18 AM

## 2013-09-10 NOTE — Progress Notes (Signed)
Adult Psychoeducational Group Note  Date:  09/10/2013 Time:  11:34 AM  Group Topic/Focus:  Wellness Toolbox: The focus of this group is to discuss various aspects of wellness, balancing those aspects and exploring ways to increase the ability to experience wellness. Patients will create a wellness toolbox for use upon discharge.  Participation Level: Active  Participation Quality: Appropriate, Sharing and Supportive  Affect: Appropriate  Cognitive: Appropriate  Insight: Appropriate  Engagement in Group: Engaged and Supportive  Modes of Intervention: Discussion, Education, Problem-solving and Support  Additional Comments: Pt attended group.   Tami [name] M 09/10/2013, 11:34 AM

## 2013-09-10 NOTE — [facility] Group Notes (Signed)
[facility] LCSW Group Therapy  09/10/2013 1:15 pm  Type of Therapy: Process Group Therapy  Participation Level: Did not attend  Summary of Progress/Problems: Today's group addressed the issue of overcoming obstacles.  Patients were asked to identify their biggest obstacle post d/c that stands in the way of their on-going success, and then problem solve as to how to manage this.    Daryel Gerald B 09/10/2013   4:10 PM

## 2013-09-10 NOTE — [facility] Suicide Risk Assessment (Signed)
[facility] INPATIENT:  Family/Significant Other Suicide Prevention Education  Suicide Prevention Education:  Education Completed; No one has been identified by the patient as the family member/significant other with whom the patient will be residing, and identified as the person(s) who will aid the patient in the event of a mental health crisis (suicidal ideations/suicide attempt).  With written consent from the patient, the family member/significant other has been provided the following suicide prevention education, prior to the and/or following the discharge of the patient.  The suicide prevention education provided includes the following:  Suicide risk factors  Suicide prevention and interventions  National Suicide Hotline telephone number  [facility] assessment telephone number  [facility] Emergency Assistance 911  [name] and/or Residential Mobile Crisis Unit telephone number  Request made of family/significant other to:  Remove weapons (e.g., guns, rifles, knives), all items previously/currently identified as safety concern.    Remove drugs/medications (over-the-counter, prescriptions, illicit drugs), all items previously/currently identified as a safety concern.  The family member/significant other verbalizes understanding of the suicide prevention education information provided.  The family member/significant other agrees to remove the items of safety concern listed above. The patient did not endorse SI at the time of admission, nor did the patient c/o SI during the stay here.  SPE not required.    Tami [name] B 09/10/2013, 10:20 AM

## 2013-09-11 DIAGNOSIS — F29 Unspecified psychosis not due to a substance or known physiological condition: Secondary | ICD-10-CM | POA: Diagnosis present

## 2013-09-11 MED ORDER — HALOPERIDOL 5 MG PO TABS: 5.0000 mg | ORAL_TABLET | Freq: Every day | ORAL | Status: DC

## 2013-09-11 MED ORDER — TRAZODONE HCL 50 MG PO TABS: 50.0000 mg | ORAL_TABLET | Freq: Every evening | ORAL | Status: DC | PRN

## 2013-09-11 MED ORDER — CARBAMAZEPINE ER 400 MG PO TB12: 400.0000 mg | ORAL_TABLET | Freq: Two times a day (BID) | ORAL | Status: DC

## 2013-09-11 MED ORDER — BENZTROPINE MESYLATE 0.5 MG PO TABS: 0.5000 mg | ORAL_TABLET | Freq: Every day | ORAL | Status: DC

## 2013-09-11 NOTE — Progress Notes (Signed)
D   Pt slept most of the shift   When she woke up she got her medications then went back to sleep   She admits to some anxiety and sadness but denies suicidal and homicidal ideation    A   Verbal support  Given   Medications administered and effectiveness monitored   Q 15 min checks R   Pt safe at present

## 2013-09-11 NOTE — [facility] Suicide Risk Assessment (Signed)
Suicide Risk Assessment  Discharge Assessment     Demographic Factors:  Low socioeconomic status, Unemployed and female  Mental Status Per Nursing Assessment::   On Admission:  NA  Current Mental Status by Physician: patient denies suicidal ideation, intent or plan  Loss Factors: Financial problems/change in socioeconomic status  Historical Factors: Impulsivity  Risk Reduction Factors:   Living with another person, especially a relative and Positive social support  Continued Clinical Symptoms:  Alcohol/Substance Abuse/Dependencies  Cognitive Features That Contribute To Risk:  Closed-mindedness Polarized thinking    Suicide Risk:  Minimal: No identifiable suicidal ideation.  Patients presenting with no risk factors but with morbid ruminations; may be classified as minimal risk based on the severity of the depressive symptoms  Discharge Diagnoses:   AXIS I:  Bipolar disorder, unspecified              Unspecified psychosis              Cannabis use disorder moderate  AXIS II:  Deferred AXIS III:   Past Medical History  Diagnosis Date  . Seizures    AXIS IV:  other psychosocial or environmental problems and problems related to social environment AXIS V:  61-70 mild symptoms  Plan Of Care/Follow-up recommendations:  Activity:  as tolerated Diet:  healthy Tests:  Carbamazepine: 9.8 Other:  patient to keep her after care appointment  Is patient on multiple antipsychotic therapies at discharge:  No   Has Patient had three or more failed trials of antipsychotic monotherapy by history:  No  Recommended Plan for Multiple Antipsychotic Therapies: NA  Thedore Mins, MD 09/11/2013, 10:07 AM

## 2013-09-11 NOTE — Progress Notes (Signed)
[facility] Adult Case Management Discharge Plan :  Will you be returning to the same living situation after discharge: Yes,  with friend Mimi At discharge, do you have transportation home?:Yes,  city bus pass, [employer] bus Do you have the ability to pay for your medications:Yes,  mental health  Release of information consent forms completed and in the chart;  Patient's signature needed at discharge.  Patient to Follow up at: Follow-up Information   Follow up with [facility] On 09/13/2013. (Go to the walk-in clinic on Thursday between 9 and 11AM for your hospital follow up appointment.)    Contact information:   [address]  [place]  [phone]      Patient denies SI/HI:   Yes,  yes    Safety Planning and Suicide Prevention discussed:  Yes,  yes  Daryel Gerald B 09/11/2013, 9:32 AM

## 2013-09-11 NOTE — Progress Notes (Signed)
Discharge note: Pt received both written and verbal discharge instructions. Pt agreed to f/u appt and med regimen. Pt received all belongings from room and locker, pt also received sample meds and prescriptions. Pt was picked up by security and taken to bus stop. Pt was given 3.00 and a bus pass for transportation to [place].

## 2013-09-11 NOTE — Discharge Summary (Signed)
Physician Discharge Summary Note  Patient:  Tami [name] is an 22 y.o., female MRN:  [id number] DOB:  [birth year] Patient phone:  [phone] (home)  Patient address:   [place] [address],   Date of Admission:  09/03/2013 Date of Discharge: 09/11/13  Reason for Admission:  Psychosis   Discharge Diagnoses: Principal Problem:   Bipolar disorder, unspecified Active Problems:   Marijuana dependence   Unspecified psychosis  Review of Systems  Constitutional: Negative.   HENT: Negative.   Eyes: Negative.   Respiratory: Negative.   Cardiovascular: Negative.   Gastrointestinal: Negative.   Genitourinary: Negative.   Musculoskeletal: Negative.   Skin: Negative.   Neurological: Negative.   Endo/Heme/Allergies: Negative.   Psychiatric/Behavioral: Negative for depression, suicidal ideas, hallucinations, memory loss and substance abuse. The patient is nervous/anxious. The patient does not have insomnia.     DSM5: Axis Diagnosis:  AXIS I: Bipolar disorder, unspecified  Unspecified psychosis  Cannabis use disorder moderate  AXIS II: Deferred  AXIS III:  Past Medical History   Diagnosis  Date   .  Seizures     AXIS IV: other psychosocial or environmental problems and problems related to social environment  AXIS V: 61-70 mild symptoms  Level of Care:  OP  Hospital Course:  Tami [name] is a 22 year old female patient who presented with GPD under IVC from [facility] where she presented acting very confused and was sent for medical clearance at [facility]. Patient has appeared confused having been observed wandering around without appropriate seasonal clothing on and has demonstrated significant thought blocking. Today on assessment the patient takes very long pauses before answering questions but then responds with "I don't know or I'm not sure." She very irritable at a certain point jumping up in bed shouting "I'm tired of those mother-fuckers messing with me. I'm pissed."  Tami [name] appeared to have no basic understanding of why she was admitted to the hospital. Nursing staff have reported extremely disorganized behaviors such as randomly wandering into her peers room and walking around exposing herself on the unit. She required a great deal of redirection to help maintain her safety. Patient has not been compliant with her medication for seizure disorder tegretol as her level on admission was less than 0.5.          Tami [name]  was admitted to the adult unit where she was evaluated and her symptoms were identified. Medication management was discussed and implemented. She was encouraged to participate in unit programming. Medical problems were identified and treated appropriately. Home medication was restarted as needed. Her tegretol was restarted and Haldol was initiated to address her behavioral problems. The patient required redirection throughout her admission for being inappropriate sexually on the unit and being intrusive with peers. She continued to show no insight into her behavior or  Mental illness. Patient made frequent requests to have adderrall reordered by the MD. Tami [name] was evaluated each day by a clinical provider to ascertain the patient's response to treatment.  Improvement was noted by the patient's report of decreasing symptoms, improved sleep and appetite, affect, medication tolerance, behavior, and participation in unit programming.  Tami [name] was asked each day to complete a self inventory noting mood, mental status, pain, new symptoms, anxiety and concerns.         She responded well to medication and being in a therapeutic and supportive environment. Positive and appropriate behavior was noted and the patient was motivated for recovery.  Tami [name] worked closely with the treatment team and  case manager to develop a discharge plan with appropriate goals. Coping skills, problem solving as well as relaxation therapies were also part of the unit programming.          By the day of discharge Tami [name] was in much improved condition than upon admission.  Symptoms were reported as significantly decreased or resolved completely.  The patient denied SI/HI and voiced no AVH. She was motivated to continue taking medication with a goal of continued improvement in mental health.          Tami [name] was discharged home with a plan to follow up as noted below.  Consults:  None  Significant Diagnostic Studies:  labs: Routine admission labs reviewed   Discharge Vitals:   Blood pressure 102/67, pulse 79, temperature 97.6 F (36.4 C), temperature source Oral, resp. rate 16, height 4' 9.5" (1.461 m), weight 63.504 kg (140 lb), last menstrual period 07/03/2013, SpO2 98.00%. Body mass index is 29.75 kg/(m^2). Lab Results:   Results for orders placed during the hospital encounter of 09/03/13 (from the past 72 hour(s))  CARBAMAZEPINE LEVEL, TOTAL     Status: None   Collection Time    09/10/13  6:28 AM      Result Value Range   Carbamazepine Lvl 9.8  4.0 - 12.0 ug/mL   Comment: Performed at [facility]    Physical Findings: AIMS: Facial and Oral Movements Muscles of Facial Expression: None, normal Lips and Perioral Area: None, normal Jaw: None, normal Tongue: None, normal,Extremity Movements Upper (arms, wrists, hands, fingers): None, normal Lower (legs, knees, ankles, toes): None, normal, Trunk Movements Neck, shoulders, hips: None, normal, Overall Severity Severity of abnormal movements (highest score from questions above): None, normal Incapacitation due to abnormal movements: None, normal Patient's awareness of abnormal movements (rate only patient's report): No Awareness, Dental Status Current problems with teeth and/or dentures?: No Does patient usually wear dentures?: No  CIWA:    COWS:     Psychiatric Specialty Exam: See Psychiatric Specialty Exam and Suicide Risk Assessment completed by Attending Physician prior to  discharge.  Discharge destination:  Home  Is patient on multiple antipsychotic therapies at discharge:  No   Has Patient had three or more failed trials of antipsychotic monotherapy by history:  No  Recommended Plan for Multiple Antipsychotic Therapies: NA     Medication List    STOP taking these medications       amphetamine-dextroamphetamine 10 MG tablet  Commonly known as:  ADDERALL     carbamazepine 200 MG tablet  Commonly known as:  TEGRETOL  Replaced by:  carbamazepine 400 MG 12 hr tablet     OLANZapine 10 MG tablet  Commonly known as:  ZYPREXA      TAKE these medications     Indication   benztropine 0.5 MG tablet  Commonly known as:  COGENTIN  Take 1 tablet (0.5 mg total) by mouth at bedtime.   Indication:  Extrapyramidal Reaction caused by Medications     carbamazepine 400 MG 12 hr tablet  Commonly known as:  TEGRETOL XR  Take 1 tablet (400 mg total) by mouth 2 (two) times daily.   Indication:  Manic-Depression, Complex Partial Epilepsy     haloperidol 5 MG tablet  Commonly known as:  HALDOL  Take 1 tablet (5 mg total) by mouth at bedtime.   Indication:  Severe Problems with Behavior     ibuprofen 200 MG tablet  Commonly known as:  ADVIL,MOTRIN  Take 200 mg by  mouth every 6 (six) hours as needed for pain.      traZODone 50 MG tablet  Commonly known as:  DESYREL  Take 1 tablet (50 mg total) by mouth at bedtime and may repeat dose one time if needed.   Indication:  Trouble Sleeping           Follow-up Information   Follow up with [facility] On 09/13/2013. (Go to the walk-in clinic on Thursday between 9 and 11AM for your hospital follow up appointment.)    Contact information:   [address]  [place]  [phone]      Follow-up recommendations:   Activity: as tolerated  Diet: healthy  Tests: Carbamazepine: 9.8  Other: patient to keep her after care appointment   Comments:    Take all your medications as prescribed by your mental  healthcare provider.  Report any adverse effects and or reactions from your medicines to your outpatient provider promptly.  Patient is instructed and cautioned to not engage in alcohol and or illegal drug use while on prescription medicines.  In the event of worsening symptoms, patient is instructed to call the crisis hotline, 911 and or go to the nearest ED for appropriate evaluation and treatment of symptoms.  Follow-up with your primary care provider for your other medical issues, concerns and or health care needs.   Total Discharge Time:  Greater than 30 minutes.  SignedFransisca Kaufmann NP-C 09/11/2013, 10:17 AM

## 2013-09-12 NOTE — ED Provider Notes (Signed)
CSN: [id number]     Arrival date & time 09/02/13  1309 History   First MD Initiated Contact with Patient 09/02/13 1406     Chief Complaint  Patient presents with  . Medical Clearance   (Consider location/radiation/quality/duration/timing/severity/associated sxs/prior Treatment) HPI Comments: Pt sent back from [facility] for as pt was wandering around in inappropriate clothes and saying things that don't make sense:pt denies any history  The history is provided by the patient. No language interpreter was used.    Past Medical History  Diagnosis Date  . Seizures    Past Surgical History  Procedure Laterality Date  . Throat surgery      throat surgery when younger doesn't know why   No family history on file. History  Substance Use Topics  . Smoking status: Current Every Day Smoker -- 1.00 packs/day for 4 years    Types: Cigarettes  . Smokeless tobacco: Not on file  . Alcohol Use: No   OB History   Grav Para Term Preterm Abortions TAB SAB Ect Mult Living                 Review of Systems  Constitutional: Negative.   Respiratory: Negative.   Cardiovascular: Negative.     Allergies  Benadryl and Penicillins  Home Medications   Current Outpatient Rx  Name  Route  Sig  Dispense  Refill  . ibuprofen (ADVIL,MOTRIN) 200 MG tablet   Oral   Take 200 mg by mouth every 6 (six) hours as needed for pain.         . benztropine (COGENTIN) 0.5 MG tablet   Oral   Take 1 tablet (0.5 mg total) by mouth at bedtime.   30 tablet   0   . carbamazepine (TEGRETOL XR) 400 MG 12 hr tablet   Oral   Take 1 tablet (400 mg total) by mouth 2 (two) times daily.   60 tablet   0   . haloperidol (HALDOL) 5 MG tablet   Oral   Take 1 tablet (5 mg total) by mouth at bedtime.   30 tablet   0   . traZODone (DESYREL) 50 MG tablet   Oral   Take 1 tablet (50 mg total) by mouth at bedtime and may repeat dose one time if needed.   60 tablet   0    BP 118/83  Pulse 100  Temp(Src) 98.8 F  (37.1 C) (Oral)  Resp 16  SpO2 100%  LMP 07/03/2013 Physical Exam  Nursing note and vitals reviewed. Constitutional: She appears well-developed and well-nourished.  Cardiovascular: Normal rate and regular rhythm.   Pulmonary/Chest: Effort normal and breath sounds normal.  Musculoskeletal: Normal range of motion.  Neurological: She is alert.  Skin: Skin is warm and dry.  Psychiatric: Her speech is tangential. She is not aggressive. She expresses no suicidal plans and no homicidal plans.    ED Course  Procedures (including critical care time) Labs Review Labs Reviewed  CBC WITH DIFFERENTIAL - Abnormal; Notable for the following:    RBC 5.38 (*)    Hemoglobin 15.2 (*)    All other components within normal limits  COMPREHENSIVE METABOLIC PANEL - Abnormal; Notable for the following:    Glucose, Bld 123 (*)    BUN 5 (*)    Alkaline Phosphatase 125 (*)    All other components within normal limits  URINE RAPID DRUG SCREEN (HOSP PERFORMED) - Abnormal; Notable for the following:    Tetrahydrocannabinol POSITIVE (*)  All other components within normal limits  ETHANOL  POCT PREGNANCY, URINE   Imaging Review No results found.  EKG Interpretation   None       MDM   1. Altered mental status   2. Marijuana abuse    Pt going to psych    Teressa Lower, NP 09/12/13 1217

## 2013-09-13 NOTE — Discharge Summary (Signed)
Seen and agreed. Gabrien Mentink, MD 

## 2013-09-14 NOTE — Progress Notes (Signed)
Patient Discharge Instructions:  After Visit Summary (AVS):   Faxed to:  09/14/13 Discharge Summary Note:   Faxed to:  09/14/13 Psychiatric Admission Assessment Note:   Faxed to:  09/14/13 Suicide Risk Assessment - Discharge Assessment:   Faxed to:  09/14/13 Faxed/Sent to the Next [facility] Care provider:  09/14/13 Faxed to [facility] @ [phone]  Jerelene Redden, 09/14/2013, 4:39 PM

## 2013-09-19 NOTE — ED Provider Notes (Signed)
Medical screening examination/treatment/procedure(s) were performed by non-physician practitioner and as supervising physician I was immediately available for consultation/collaboration.  EKG Interpretation   None        Doug Sou, MD 09/19/13 [phone]

## 2014-02-22 ENCOUNTER — Emergency Department (HOSPITAL_COMMUNITY)
Admission: EM | Admit: 2014-02-22 | Discharge: 2014-02-22 | Discharge status: Against Medical Advice | Payer: Medicaid Other | Attending: Emergency Medicine | Admitting: Emergency Medicine

## 2014-02-22 ENCOUNTER — Encounter (HOSPITAL_COMMUNITY): Payer: Self-pay | Admitting: Emergency Medicine

## 2014-02-22 DIAGNOSIS — R109 Unspecified abdominal pain: Secondary | ICD-10-CM | POA: Insufficient documentation

## 2014-02-22 DIAGNOSIS — F17200 Nicotine dependence, unspecified, uncomplicated: Secondary | ICD-10-CM | POA: Insufficient documentation

## 2014-02-22 LAB — URINALYSIS, ROUTINE W REFLEX MICROSCOPIC
Bilirubin Urine: NEGATIVE
Glucose, UA: NEGATIVE mg/dL
Hgb urine dipstick: NEGATIVE
Ketones, ur: NEGATIVE mg/dL
Leukocytes, UA: NEGATIVE
Nitrite: POSITIVE — AB
Protein, ur: NEGATIVE mg/dL
Specific Gravity, Urine: 1.013 (ref 1.005–1.030)
Urobilinogen, UA: 0.2 mg/dL (ref 0.0–1.0)
pH: 6.5 (ref 5.0–8.0)

## 2014-02-22 LAB — URINE MICROSCOPIC-ADD ON

## 2014-02-22 NOTE — ED Notes (Signed)
Pt to department via EMS- pt reports that she has right sided abd pain that radiates down into her groin. States that she has been having unprotected sex, and has a distended abd. Pt is tender with palpation. Reports burning with urination. Pt from a group home. Bp-128/88 Hr-90

## 2014-02-22 NOTE — ED Notes (Signed)
Pt does not want blood to be taken at this time; pt stated "I just want some pain medicine and then I can go"; asked pt if I could draw some blood work and she stated "if you give me some medicine first"; Lillia AbedLindsay, RN notified and pt then stated "I'll just wait for my room"

## 2014-02-22 NOTE — ED Notes (Signed)
Pt left with counselor.

## 2014-02-22 NOTE — ED Notes (Signed)
Pt states that she wanted to go back to the group home. Pt calling for a ride

## 2014-03-05 ENCOUNTER — Encounter (HOSPITAL_COMMUNITY): Payer: Self-pay | Admitting: Emergency Medicine

## 2014-03-05 ENCOUNTER — Emergency Department (INDEPENDENT_AMBULATORY_CARE_PROVIDER_SITE_OTHER)
Admission: EM | Admit: 2014-03-05 | Discharge: 2014-03-05 | Disposition: A | Discharge status: Home / Self Care | Payer: Self-pay | Source: Home / Self Care | Attending: Family Medicine | Admitting: Family Medicine

## 2014-03-05 DIAGNOSIS — Z760 Encounter for issue of repeat prescription: Secondary | ICD-10-CM

## 2014-03-05 NOTE — ED Notes (Signed)
Pt is here accompanied by group home staff Needing seizure and thyroid meds refilled  No PCP; hx of MR Alert w/no signs of acute distress.

## 2014-03-05 NOTE — Discharge Instructions (Signed)
You will need to follow up with the primary care doctor you have been assigned to through your group home or DSS voucher program to have your medications refilled. Please call for an appointment.   Medication Refill, Emergency Department It is best for your medical care, however, to take care of getting refills done through your primary caregiver's office. They have your records and can do a better job of follow-up than we can in the emergency department. On maintenance medications, we often only prescribe enough medications to get you by until you are able to see your regular caregiver. This is a more expensive way to refill medications. In the future, please plan for refills so that you will not have to use the emergency department for this. Thank you for your help. Your help allows us to better take care of the daily emergencies that enter our department. Document Released: 02/04/2004 Document Revised: 01/10/2012 Document Reviewed: 10/18/2005 [facility] Patient Information 2014 [employer] [place].

## 2014-03-05 NOTE — ED Provider Notes (Signed)
Medical screening examination/treatment/procedure(s) were performed by resident physician or non-physician practitioner and as supervising physician I was immediately available for consultation/collaboration.   Barkley BrunsKINDL,Anastacio Bua DOUGLAS MD.   Linna HoffJames D Kierra Jezewski, MD 03/05/14 [phone]

## 2014-03-05 NOTE — ED Provider Notes (Signed)
CSN: [id number]     Arrival date & time 03/05/14  0929 History   First MD Initiated Contact with Patient 03/05/14 1001     Chief Complaint  Patient presents with  . Medication Refill   (Consider location/radiation/quality/duration/timing/severity/associated sxs/prior Treatment) HPI Comments: Patient presents to [facility] with a staff member from her new group home requesting refill Rx's for all of her medications. Group home staff member reports she is a new resident at the facility and that "D[facility] dropped her off here with nothing. No medications and no doctor."  Patient is currently without complaint.  Staff member states she needs thyroid replacement medication, anti-epileptic medication and medication for mental illness and insomnia.   Medical records would indicate patient with hx of polysubstance abuse, mental illness and seizure disorder.   The history is provided by the patient and a caregiver.    Past Medical History  Diagnosis Date  . Seizures    Past Surgical History  Procedure Laterality Date  . Throat surgery      throat surgery when younger doesn't know why   No family history on file. History  Substance Use Topics  . Smoking status: Current Every Day Smoker -- 1.00 packs/day for 4 years    Types: Cigarettes  . Smokeless tobacco: Not on file  . Alcohol Use: No   OB History   Grav Para Term Preterm Abortions TAB SAB Ect Mult Living                 Review of Systems  All other systems reviewed and are negative.   Allergies  Benadryl and Penicillins  Home Medications   Prior to Admission medications   Medication Sig Start Date End Date Taking? Authorizing Provider  benztropine (COGENTIN) 0.5 MG tablet Take 1 tablet (0.5 mg total) by mouth at bedtime. 09/11/13   Elmarie Shiley, NP  carbamazepine (TEGRETOL XR) 400 MG 12 hr tablet Take 1 tablet (400 mg total) by mouth 2 (two) times daily. 09/11/13   Elmarie Shiley, NP  haloperidol (HALDOL) 5 MG tablet Take 1 tablet (5 mg  total) by mouth at bedtime. 09/11/13   Elmarie Shiley, NP  ibuprofen (ADVIL,MOTRIN) 200 MG tablet Take 200 mg by mouth every 6 (six) hours as needed for pain.    Historical Provider, MD  traZODone (DESYREL) 50 MG tablet Take 1 tablet (50 mg total) by mouth at bedtime and may repeat dose one time if needed. 09/11/13   Elmarie Shiley, NP   BP 101/73  Pulse 62  Temp(Src) 98.2 F (36.8 C) (Oral)  Resp 14  SpO2 99%  LMP 01/30/2014 Physical Exam  Nursing note and vitals reviewed. Constitutional: She is oriented to person, place, and time. She appears well-developed and well-nourished. No distress.  HENT:  Head: Normocephalic and atraumatic.  Eyes: Conjunctivae are normal.  Cardiovascular: Normal rate.   Pulmonary/Chest: Effort normal.  Musculoskeletal: Normal range of motion.  Neurological: She is alert and oriented to person, place, and time.  Skin: Skin is warm and dry. No rash noted.  Psychiatric: She has a normal mood and affect. Her behavior is normal.    ED Course  Procedures (including critical care time) Labs Review Labs Reviewed - No data to display  Imaging Review No results found.   MDM   1. Encounter for medication refill    Patient and staff member met with our clinic's patient access coordinator and it was discovered that group home supervisor is in possession of vouchers for patient to  be seen by primary care provider, but that group home has yet to call for appointment. It also became clear that patient has been without requested medications for several weeks as she was recently homeless and living on street for unknown period of time. I advised patient and group home staff that we would not be able to prescribe requested medications and patient would be best served to be seen by primary care provider to which she has been assigned for management.     [place], [place] 03/05/14 [phone]

## 2014-03-05 NOTE — ED Notes (Signed)
Group home staff and pt left w/o being d/c.  Group home staff frustrated; notified [place], [place]

## 2014-07-10 ENCOUNTER — Encounter (HOSPITAL_COMMUNITY): Payer: Self-pay | Admitting: Emergency Medicine

## 2014-07-10 ENCOUNTER — Emergency Department (HOSPITAL_COMMUNITY)
Admission: EM | Admit: 2014-07-10 | Discharge: 2014-07-11 | Disposition: A | Discharge status: Home / Self Care | Payer: Medicaid Other | Attending: Emergency Medicine | Admitting: Emergency Medicine

## 2014-07-10 ENCOUNTER — Emergency Department (HOSPITAL_COMMUNITY): Payer: Medicaid Other

## 2014-07-10 DIAGNOSIS — R1011 Right upper quadrant pain: Secondary | ICD-10-CM | POA: Diagnosis not present

## 2014-07-10 DIAGNOSIS — F411 Generalized anxiety disorder: Secondary | ICD-10-CM | POA: Diagnosis not present

## 2014-07-10 DIAGNOSIS — Z3202 Encounter for pregnancy test, result negative: Secondary | ICD-10-CM | POA: Diagnosis not present

## 2014-07-10 DIAGNOSIS — R1031 Right lower quadrant pain: Secondary | ICD-10-CM | POA: Insufficient documentation

## 2014-07-10 DIAGNOSIS — R1032 Left lower quadrant pain: Secondary | ICD-10-CM | POA: Insufficient documentation

## 2014-07-10 DIAGNOSIS — F319 Bipolar disorder, unspecified: Secondary | ICD-10-CM | POA: Diagnosis not present

## 2014-07-10 DIAGNOSIS — Z9089 Acquired absence of other organs: Secondary | ICD-10-CM | POA: Diagnosis not present

## 2014-07-10 DIAGNOSIS — Z791 Long term (current) use of non-steroidal anti-inflammatories (NSAID): Secondary | ICD-10-CM | POA: Insufficient documentation

## 2014-07-10 DIAGNOSIS — F17200 Nicotine dependence, unspecified, uncomplicated: Secondary | ICD-10-CM | POA: Insufficient documentation

## 2014-07-10 DIAGNOSIS — Z79899 Other long term (current) drug therapy: Secondary | ICD-10-CM | POA: Insufficient documentation

## 2014-07-10 DIAGNOSIS — N949 Unspecified condition associated with female genital organs and menstrual cycle: Secondary | ICD-10-CM | POA: Diagnosis not present

## 2014-07-10 DIAGNOSIS — Z880 Allergy status to penicillin: Secondary | ICD-10-CM | POA: Insufficient documentation

## 2014-07-10 HISTORY — DX: Bipolar disorder, unspecified: F31.9

## 2014-07-10 LAB — URINALYSIS, ROUTINE W REFLEX MICROSCOPIC
Bilirubin Urine: NEGATIVE
Glucose, UA: NEGATIVE mg/dL
Hgb urine dipstick: NEGATIVE
Ketones, ur: NEGATIVE mg/dL
Leukocytes, UA: NEGATIVE
Nitrite: NEGATIVE
Protein, ur: NEGATIVE mg/dL
Specific Gravity, Urine: 1.02 (ref 1.005–1.030)
Urobilinogen, UA: 1 mg/dL (ref 0.0–1.0)
pH: 7 (ref 5.0–8.0)

## 2014-07-10 LAB — CBC WITH DIFFERENTIAL/PLATELET
Basophils Absolute: 0 10*3/uL (ref 0.0–0.1)
Basophils Relative: 0 % (ref 0–1)
Eosinophils Absolute: 0.3 10*3/uL (ref 0.0–0.7)
Eosinophils Relative: 3 % (ref 0–5)
HCT: 39.8 % (ref 36.0–46.0)
Hemoglobin: 13.3 g/dL (ref 12.0–15.0)
Lymphocytes Relative: 30 % (ref 12–46)
Lymphs Abs: 2.8 10*3/uL (ref 0.7–4.0)
MCH: 26.9 pg (ref 26.0–34.0)
MCHC: 33.4 g/dL (ref 30.0–36.0)
MCV: 80.6 fL (ref 78.0–100.0)
Monocytes Absolute: 0.6 10*3/uL (ref 0.1–1.0)
Monocytes Relative: 6 % (ref 3–12)
Neutro Abs: 5.7 10*3/uL (ref 1.7–7.7)
Neutrophils Relative %: 61 % (ref 43–77)
Platelets: 260 10*3/uL (ref 150–400)
RBC: 4.94 MIL/uL (ref 3.87–5.11)
RDW: 13.2 % (ref 11.5–15.5)
WBC: 9.4 10*3/uL (ref 4.0–10.5)

## 2014-07-10 LAB — COMPREHENSIVE METABOLIC PANEL
ALT: 23 U/L (ref 0–35)
AST: 17 U/L (ref 0–37)
Albumin: 3.6 g/dL (ref 3.5–5.2)
Alkaline Phosphatase: 108 U/L (ref 39–117)
Anion gap: 13 (ref 5–15)
BUN: 10 mg/dL (ref 6–23)
CO2: 23 mEq/L (ref 19–32)
Calcium: 8.7 mg/dL (ref 8.4–10.5)
Chloride: 102 mEq/L (ref 96–112)
Creatinine, Ser: 0.61 mg/dL (ref 0.50–1.10)
GFR calc Af Amer: >90 mL/min (ref >90)
GFR calc non Af Amer: >90 mL/min (ref >90)
Glucose, Bld: 81 mg/dL (ref 70–99)
Potassium: 3.8 mEq/L (ref 3.7–5.3)
Sodium: 138 mEq/L (ref 137–147)
Total Bilirubin: 0.3 mg/dL (ref 0.3–1.2)
Total Protein: 7.2 g/dL (ref 6.0–8.3)

## 2014-07-10 LAB — WET PREP, GENITAL
Trich, Wet Prep: NONE SEEN
Yeast Wet Prep HPF POC: NONE SEEN

## 2014-07-10 MED ORDER — MORPHINE SULFATE 4 MG/ML IJ SOLN
4.0000 mg | Freq: Once | INTRAMUSCULAR | Status: AC
  Administered 2014-07-10: 4 mg via INTRAVENOUS
  Filled 2014-07-10: qty 1

## 2014-07-10 MED ORDER — IOHEXOL 300 MG/ML  SOLN
100.0000 mL | Freq: Once | INTRAMUSCULAR | Status: AC | PRN
  Administered 2014-07-10: 80 mL via INTRAVENOUS

## 2014-07-10 MED ORDER — SODIUM CHLORIDE 0.9 % IV BOLUS (SEPSIS)
1000.0000 mL | Freq: Once | INTRAVENOUS | Status: AC
  Administered 2014-07-10: 1000 mL via INTRAVENOUS

## 2014-07-10 MED ORDER — KETOROLAC TROMETHAMINE 30 MG/ML IJ SOLN
30.0000 mg | Freq: Once | INTRAMUSCULAR | Status: AC
  Administered 2014-07-10: 30 mg via INTRAVENOUS
  Filled 2014-07-10: qty 1

## 2014-07-10 MED ORDER — IOHEXOL 300 MG/ML  SOLN
25.0000 mL | Freq: Once | INTRAMUSCULAR | Status: AC | PRN
  Administered 2014-07-10: 25 mL via ORAL

## 2014-07-10 MED ORDER — ONDANSETRON HCL 4 MG/2ML IJ SOLN
4.0000 mg | INTRAMUSCULAR | Status: AC
  Administered 2014-07-10: 4 mg via INTRAVENOUS
  Filled 2014-07-10: qty 2

## 2014-07-10 NOTE — ED Notes (Signed)
Urine preg negative, not crossing over into system.

## 2014-07-10 NOTE — ED Notes (Signed)
PA at BS.  

## 2014-07-10 NOTE — ED Notes (Signed)
Pt drinking oral contrast, informed to let this RN know when the pt finishes the drink.

## 2014-07-10 NOTE — ED Notes (Signed)
Pt c/o lower abd pain around belly button with some dysuria

## 2014-07-10 NOTE — ED Notes (Signed)
CT called and informed pt has finished contrast. 

## 2014-07-10 NOTE — ED Provider Notes (Signed)
CSN: [id number]     Arrival date & time 07/10/14  1643 History   First MD Initiated Contact with Patient 07/10/14 2017     Chief Complaint  Patient presents with  . Abdominal Pain    (Consider location/radiation/quality/duration/timing/severity/associated sxs/prior Treatment) HPI Comments: Patient is a 23 year old female with a history of seizures and bipolar disorder who presents to the emergency department for lower abdominal pain. Patient states that she began experiencing sharp pain of sudden onset in her right mid abdomen and right lower quadrant approximately 5 hours ago. Patient states that the pain caused her to "fall to the ground". She states the pain has been constant since this time. Patient states that she took Tylenol prior to arrival in the emergency department. She denies any relief with this. She states that the pain intermittently radiates to her R flank. She denies fever, chest pain, shortness of breath, nausea, vomiting, hematuria, melena, hematochezia, vaginal bleeding, vaginal d/c, and syncope. Patient denies dysuria on my questioning, despite triage note. Abdominal surgical hx significant for appendectomy. Last BM normal today which was normal in color and consistency.  Patient is a 23 y.o. female presenting with abdominal pain. The history is provided by the patient. No language interpreter was used.  Abdominal Pain   Past Medical History  Diagnosis Date  . Seizures   . Bipolar disorder    Past Surgical History  Procedure Laterality Date  . Throat surgery      throat surgery when younger doesn't know why   History reviewed. No pertinent family history. History  Substance Use Topics  . Smoking status: Current Every Day Smoker -- 1.00 packs/day for 4 years    Types: Cigarettes  . Smokeless tobacco: Not on file  . Alcohol Use: No   OB History   Grav Para Term Preterm Abortions TAB SAB Ect Mult Living                  Review of Systems  Gastrointestinal:  Positive for abdominal pain.  Genitourinary: Positive for pelvic pain.  All other systems reviewed and are negative.   Allergies  Benadryl and Penicillins  Home Medications   Prior to Admission medications   Medication Sig Start Date End Date Taking? Authorizing Provider  ibuprofen (ADVIL,MOTRIN) 200 MG tablet Take 200 mg by mouth every 6 (six) hours as needed for pain.   Yes Historical Provider, MD  Levothyroxine Sodium (SYNTHROID PO) Take 1 tablet by mouth daily.   Yes Historical Provider, MD  Lurasidone HCl (LATUDA PO) Take 1 tablet by mouth daily.   Yes Historical Provider, MD  traZODone (DESYREL) 50 MG tablet Take 1 tablet (50 mg total) by mouth at bedtime and may repeat dose one time if needed. 09/11/13  Yes Fransisca Kaufmann, NP  naproxen (NAPROSYN) 500 MG tablet Take 1 tablet (500 mg total) by mouth 2 (two) times daily. 07/11/14   Antony Madura, PA-C   BP 113/66  Pulse 69  Temp(Src) 97.8 F (36.6 C) (Oral)  Resp 20  SpO2 100%  LMP 07/10/2014  Physical Exam  Nursing note and vitals reviewed. Constitutional: She is oriented to person, place, and time. She appears well-developed and well-nourished. No distress.  Patient appears uncomfortable; nontoxic/nonseptic appearing  HENT:  Head: Normocephalic and atraumatic.  Eyes: Conjunctivae and EOM are normal. No scleral icterus.  Neck: Normal range of motion.  Cardiovascular: Normal rate, regular rhythm and intact distal pulses.   Pulmonary/Chest: Effort normal and breath sounds normal. No respiratory distress. She  has no wheezes. She has no rales.  Chest expansion symmetric.  Abdominal: Soft. She exhibits no mass. There is tenderness. There is no rebound and no guarding.  Patient with focal TTP in RLQ and R mid abdomen. Patient also with TTP in RUQ and LLQ referred to the R lower abdomen. No rebound. No masses or peritoneal signs. Abdomen soft.  Genitourinary: There is no rash, tenderness, lesion or injury on the right labia. There is  no rash, tenderness, lesion or injury on the left labia. Uterus is not tender. Cervix exhibits no motion tenderness and no friability. Right adnexum displays tenderness. Right adnexum displays no mass. Left adnexum displays tenderness. Left adnexum displays no mass. No erythema, tenderness or bleeding around the vagina. No foreign body around the vagina. No signs of injury around the vagina. No vaginal discharge found.  B/l adnexal TTP on GU exam; R>L. No CMT.  Musculoskeletal: Normal range of motion.  Neurological: She is alert and oriented to person, place, and time. She exhibits normal muscle tone. Coordination normal.  GCS 15. Patient moves extremities without ataxia.  Skin: Skin is warm and dry. No rash noted. She is not diaphoretic. No erythema. No pallor.  Psychiatric: Her speech is normal. Her mood appears anxious. She is slowed.    ED Course  Procedures (including critical care time) Labs Review Labs Reviewed  WET PREP, GENITAL - Abnormal; Notable for the following:    Clue Cells Wet Prep HPF POC FEW (*)    WBC, Wet Prep HPF POC FEW (*)    All other components within normal limits  GC/CHLAMYDIA PROBE AMP  URINALYSIS, ROUTINE W REFLEX MICROSCOPIC  CBC WITH DIFFERENTIAL  COMPREHENSIVE METABOLIC PANEL  POC URINE PREG, ED    Imaging Review US Transvaginal Non-ob  07/10/2014   CLINICAL DATA:  Pelvic pain, right greater than left  EXAM: TRANSABDOMINAL AND TRANSVAGINAL ULTRASOUND OF PELVIS  DOPPLER ULTRASOUND OF OVARIES  TECHNIQUE: Both transabdominal and transvaginal ultrasound examinations of the pelvis were performed. Transabdominal technique was performed for global imaging of the pelvis including uterus, ovaries, adnexal regions, and pelvic cul-de-sac.  It was necessary to proceed with endovaginal exam following the transabdominal exam to visualize the endometrium and bilateral ovaries. Color and duplex Doppler ultrasound was utilized to evaluate blood flow to the ovaries.   COMPARISON:  None.  FINDINGS: Uterus  Measurements: 6.7 x 3.1 x 4.9 cm. No fibroids or other mass visualized.  Endometrium  Thickness: 7 mm.  No focal abnormality visualized.  Right ovary  Measurements: 2.6 x 1.5 x 1.5 cm. Normal appearance/no adnexal mass.  Left ovary  Measurements: 2.5 x 1.7 x 2.0 cm. Normal appearance/no adnexal mass.  Pulsed Doppler evaluation of both ovaries demonstrates normal low-resistance arterial and venous waveforms.  Other findings  No free fluid.  IMPRESSION: Negative pelvic ultrasound.  No evidence of ovarian torsion.   Electronically Signed   By: Charline Bills M.D.   On: 07/10/2014 22:37   US Pelvis Complete  07/10/2014   CLINICAL DATA:  Pelvic pain, right greater than left  EXAM: TRANSABDOMINAL AND TRANSVAGINAL ULTRASOUND OF PELVIS  DOPPLER ULTRASOUND OF OVARIES  TECHNIQUE: Both transabdominal and transvaginal ultrasound examinations of the pelvis were performed. Transabdominal technique was performed for global imaging of the pelvis including uterus, ovaries, adnexal regions, and pelvic cul-de-sac.  It was necessary to proceed with endovaginal exam following the transabdominal exam to visualize the endometrium and bilateral ovaries. Color and duplex Doppler ultrasound was utilized to evaluate blood flow to  the ovaries.  COMPARISON:  None.  FINDINGS: Uterus  Measurements: 6.7 x 3.1 x 4.9 cm. No fibroids or other mass visualized.  Endometrium  Thickness: 7 mm.  No focal abnormality visualized.  Right ovary  Measurements: 2.6 x 1.5 x 1.5 cm. Normal appearance/no adnexal mass.  Left ovary  Measurements: 2.5 x 1.7 x 2.0 cm. Normal appearance/no adnexal mass.  Pulsed Doppler evaluation of both ovaries demonstrates normal low-resistance arterial and venous waveforms.  Other findings  No free fluid.  IMPRESSION: Negative pelvic ultrasound.  No evidence of ovarian torsion.   Electronically Signed   By: Charline Bills M.D.   On: 07/10/2014 22:37   Ct Abdomen Pelvis W  Contrast  07/11/2014   CLINICAL DATA:  Low abdominal pain around the umbilicus with some dysuria.  EXAM: CT ABDOMEN AND PELVIS WITH CONTRAST  TECHNIQUE: Multidetector CT imaging of the abdomen and pelvis was performed using the standard protocol following bolus administration of intravenous contrast.  CONTRAST:  25mL OMNIPAQUE IOHEXOL 300 MG/ML SOLN, 80mL OMNIPAQUE IOHEXOL 300 MG/ML SOLN  COMPARISON:  Ultrasound pelvis 07/10/2014  FINDINGS: Lung bases are clear.  The liver, spleen, gallbladder, pancreas, adrenal glands, kidneys, abdominal aorta, inferior vena cava, and retroperitoneal lymph nodes are unremarkable. Stomach and small bowel appear normal for degree of distention. Stool-filled colon without distention. No free air or free fluid in the abdomen.  Pelvis: The appendix is surgically absent. Uterus and ovaries are not enlarged. No pelvic mass or lymphadenopathy. Bladder wall is not thickened. No evidence of diverticulitis. The no destructive bone lesions.  IMPRESSION: No acute inflammatory process demonstrated in the abdomen or pelvis.   Electronically Signed   By: Burman Nieves M.D.   On: 07/11/2014 00:12   [place] Art/ven Flow Abd Pelv Doppler  07/10/2014   CLINICAL DATA:  Pelvic pain, right greater than left  EXAM: TRANSABDOMINAL AND TRANSVAGINAL ULTRASOUND OF PELVIS  DOPPLER ULTRASOUND OF OVARIES  TECHNIQUE: Both transabdominal and transvaginal ultrasound examinations of the pelvis were performed. Transabdominal technique was performed for global imaging of the pelvis including uterus, ovaries, adnexal regions, and pelvic cul-de-sac.  It was necessary to proceed with endovaginal exam following the transabdominal exam to visualize the endometrium and bilateral ovaries. Color and duplex Doppler ultrasound was utilized to evaluate blood flow to the ovaries.  COMPARISON:  None.  FINDINGS: Uterus  Measurements: 6.7 x 3.1 x 4.9 cm. No fibroids or other mass visualized.  Endometrium  Thickness: 7 mm.  No focal  abnormality visualized.  Right ovary  Measurements: 2.6 x 1.5 x 1.5 cm. Normal appearance/no adnexal mass.  Left ovary  Measurements: 2.5 x 1.7 x 2.0 cm. Normal appearance/no adnexal mass.  Pulsed Doppler evaluation of both ovaries demonstrates normal low-resistance arterial and venous waveforms.  Other findings  No free fluid.  IMPRESSION: Negative pelvic ultrasound.  No evidence of ovarian torsion.   Electronically Signed   By: Charline Bills M.D.   On: 07/10/2014 22:37     EKG Interpretation None      MDM   Final diagnoses:  Right lower quadrant abdominal pain    23 year old female with a history of seizures and bipolar disorder presents to the emergency department for sudden onset of right lower abdominal pain. SHx of appendectomy. Pain caused patient to "fall to the floor". Physical exam with focal tenderness in the right lower abdomen. No peritoneal signs. Patient also noted to have bilateral adnexal tenderness on GU exam. Given history of symptoms, initial concern was for ovarian torsion.  Pelvic ultrasound, however, shows no evidence of torsion or any other acute pelvic process. Further workup initiated with CT scan. CT today is also negative for acute abdominal-pelvic process.  Pain has been controlled in ED with morphine. Labs are reassuring without leukocytosis, anemia, or electrolyte imbalance. Liver and kidney function preserved. Urine pregnancy negative and urinalysis does not suggest infection. Unable to identify source of pain, though I do not appreciate patient's pain today to be emergent given her reassuring work up. Will discharge home with instructions to followup with her primary care provider. Naproxen prescribed for pain control. Return precautions provided and discussed. Patient agreeable to plan with no unaddressed concerns. Patient discharged in good condition; VSS   Filed Vitals:   07/10/14 2325 07/10/14 2330 07/11/14 0002 07/11/14 0029  BP: 102/66 106/73 135/79  113/66  Pulse:  80 69   Temp:    97.8 F (36.6 C)  TempSrc:    Oral  Resp:      SpO2: 100% 100% 100%      Antony Madura, PA-C 07/11/14 0034

## 2014-07-11 LAB — POC URINE PREG, ED: Preg Test, Ur: NEGATIVE

## 2014-07-11 LAB — GC/CHLAMYDIA PROBE AMP
CT Probe RNA: NEGATIVE
GC Probe RNA: NEGATIVE

## 2014-07-11 MED ORDER — NAPROXEN 500 MG PO TABS
500.0000 mg | ORAL_TABLET | Freq: Two times a day (BID) | ORAL | Status: DC
Start: 2014-07-11 — End: 2015-01-03

## 2014-07-11 MED ORDER — OXYCODONE-ACETAMINOPHEN 5-325 MG PO TABS
2.0000 | ORAL_TABLET | Freq: Once | ORAL | Status: AC
  Administered 2014-07-11: 2 via ORAL
  Filled 2014-07-11: qty 2

## 2014-07-11 NOTE — Discharge Instructions (Signed)
Take naproxen as needed for pain. Recommend you followup with your primary doctor to ensure that symptoms resolve. Return to the emergency department as needed if symptoms worsen.  Abdominal Pain, Women Abdominal (stomach, pelvic, or belly) pain can be caused by many things. It is important to tell your doctor:  The location of the pain.  Does it come and go or is it present all the time?  Are there things that start the pain (eating certain foods, exercise)?  Are there other symptoms associated with the pain (fever, nausea, vomiting, diarrhea)? All of this is helpful to know when trying to find the cause of the pain. CAUSES   Stomach: virus or bacteria infection, or ulcer.  Intestine: appendicitis (inflamed appendix), regional ileitis (Crohn's disease), ulcerative colitis (inflamed colon), irritable bowel syndrome, diverticulitis (inflamed diverticulum of the colon), or cancer of the stomach or intestine.  Gallbladder disease or stones in the gallbladder.  Kidney disease, kidney stones, or infection.  Pancreas infection or cancer.  Fibromyalgia (pain disorder).  Diseases of the female organs:  Uterus: fibroid (non-cancerous) tumors or infection.  Fallopian tubes: infection or tubal pregnancy.  Ovary: cysts or tumors.  Pelvic adhesions (scar tissue).  Endometriosis (uterus lining tissue growing in the pelvis and on the pelvic organs).  Pelvic congestion syndrome (female organs filling up with blood just before the menstrual period).  Pain with the menstrual period.  Pain with ovulation (producing an egg).  Pain with an IUD (intrauterine device, birth control) in the uterus.  Cancer of the female organs.  Functional pain (pain not caused by a disease, may improve without treatment).  Psychological pain.  Depression. DIAGNOSIS  Your doctor will decide the seriousness of your pain by doing an examination.  Blood tests.  X-rays.  Ultrasound.  CT scan  (computed tomography, special type of X-ray).  MRI (magnetic resonance imaging).  Cultures, for infection.  Barium enema (dye inserted in the large intestine, to better view it with X-rays).  Colonoscopy (looking in intestine with a lighted tube).  Laparoscopy (minor surgery, looking in abdomen with a lighted tube).  Major abdominal exploratory surgery (looking in abdomen with a large incision). TREATMENT  The treatment will depend on the cause of the pain.   Many cases can be observed and treated at home.  Over-the-counter medicines recommended by your caregiver.  Prescription medicine.  Antibiotics, for infection.  Birth control pills, for painful periods or for ovulation pain.  Hormone treatment, for endometriosis.  Nerve blocking injections.  Physical therapy.  Antidepressants.  Counseling with a psychologist or psychiatrist.  Minor or major surgery. HOME CARE INSTRUCTIONS   Do not take laxatives, unless directed by your caregiver.  Take over-the-counter pain medicine only if ordered by your caregiver. Do not take aspirin because it can cause an upset stomach or bleeding.  Try a clear liquid diet (broth or water) as ordered by your caregiver. Slowly move to a bland diet, as tolerated, if the pain is related to the stomach or intestine.  Have a thermometer and take your temperature several times a day, and record it.  Bed rest and sleep, if it helps the pain.  Avoid sexual intercourse, if it causes pain.  Avoid stressful situations.  Keep your follow-up appointments and tests, as your caregiver orders.  If the pain does not go away with medicine or surgery, you may try:  Acupuncture.  Relaxation exercises (yoga, meditation).  Group therapy.  Counseling. SEEK MEDICAL CARE IF:   You notice certain foods cause  stomach pain.  Your home care treatment is not helping your pain.  You need stronger pain medicine.  You want your IUD removed.  You  feel faint or lightheaded.  You develop nausea and vomiting.  You develop a rash.  You are having side effects or an allergy to your medicine. SEEK IMMEDIATE MEDICAL CARE IF:   Your pain does not go away or gets worse.  You have a fever.  Your pain is felt only in portions of the abdomen. The right side could possibly be appendicitis. The left lower portion of the abdomen could be colitis or diverticulitis.  You are passing blood in your stools (bright red or black tarry stools, with or without vomiting).  You have blood in your urine.  You develop chills, with or without a fever.  You pass out. MAKE SURE YOU:   Understand these instructions.  Will watch your condition.  Will get help right away if you are not doing well or get worse. Document Released: 08/15/2007 Document Revised: 03/04/2014 Document Reviewed: 09/04/2009 [facility] Patient Information 2015 [place], [place]. This information is not intended to replace advice given to you by your health care provider. Make sure you discuss any questions you have with your health care provider.

## 2014-07-11 NOTE — ED Provider Notes (Signed)
Medical screening examination/treatment/procedure(s) were performed by non-physician practitioner and as supervising physician I was immediately available for consultation/collaboration.   EKG Interpretation None       Arby Barrette, MD 07/11/14 [phone]

## 2014-07-20 ENCOUNTER — Emergency Department (HOSPITAL_COMMUNITY)
Admission: EM | Admit: 2014-07-20 | Discharge: 2014-07-20 | Disposition: A | Discharge status: Home / Self Care | Payer: Medicaid Other | Attending: Emergency Medicine | Admitting: Emergency Medicine

## 2014-07-20 ENCOUNTER — Encounter (HOSPITAL_COMMUNITY): Payer: Self-pay | Admitting: Emergency Medicine

## 2014-07-20 DIAGNOSIS — G8929 Other chronic pain: Secondary | ICD-10-CM | POA: Insufficient documentation

## 2014-07-20 DIAGNOSIS — M545 Low back pain, unspecified: Secondary | ICD-10-CM | POA: Insufficient documentation

## 2014-07-20 DIAGNOSIS — M546 Pain in thoracic spine: Secondary | ICD-10-CM | POA: Insufficient documentation

## 2014-07-20 DIAGNOSIS — F17200 Nicotine dependence, unspecified, uncomplicated: Secondary | ICD-10-CM | POA: Diagnosis not present

## 2014-07-20 DIAGNOSIS — Z79899 Other long term (current) drug therapy: Secondary | ICD-10-CM | POA: Diagnosis not present

## 2014-07-20 DIAGNOSIS — F319 Bipolar disorder, unspecified: Secondary | ICD-10-CM | POA: Diagnosis not present

## 2014-07-20 DIAGNOSIS — Z791 Long term (current) use of non-steroidal anti-inflammatories (NSAID): Secondary | ICD-10-CM | POA: Diagnosis not present

## 2014-07-20 DIAGNOSIS — Z8669 Personal history of other diseases of the nervous system and sense organs: Secondary | ICD-10-CM | POA: Insufficient documentation

## 2014-07-20 DIAGNOSIS — Z880 Allergy status to penicillin: Secondary | ICD-10-CM | POA: Insufficient documentation

## 2014-07-20 HISTORY — DX: Dorsalgia, unspecified: M54.9

## 2014-07-20 HISTORY — DX: Other chronic pain: G89.29

## 2014-07-20 MED ORDER — IBUPROFEN 800 MG PO TABS
800.0000 mg | ORAL_TABLET | Freq: Once | ORAL | Status: AC
  Administered 2014-07-20: 800 mg via ORAL
  Filled 2014-07-20: qty 1

## 2014-07-20 NOTE — ED Notes (Signed)
Patient arrived via [facility]. Patient was picked up in front of a home she states was her cousins house. No one appeared to be at home. Patient states she started having back pain that was more intense then usual and that is why she is here. Patients story keeps changing. She stated to [facility] upon arrival she was not having any pain and now she is. VSS, A/O.

## 2014-07-20 NOTE — ED Notes (Signed)
Discharge instructions given and voiced understanding

## 2014-07-20 NOTE — ED Provider Notes (Signed)
CSN: [id number]     Arrival date & time 07/20/14  0229 History   First MD Initiated Contact with Patient 07/20/14 0246     Chief Complaint  Patient presents with  . Back Pain      Patient is a 23 y.o. female presenting with back pain. The history is provided by the patient.  Back Pain Location:  Thoracic spine and lumbar spine Quality:  Aching Pain severity:  Moderate Onset quality:  Gradual Timing:  Constant Progression:  Worsening Chronicity:  New Relieved by: rest. Worsened by:  Movement Associated symptoms: no abdominal pain, no bladder incontinence, no bowel incontinence, no dysuria, no fever and no weakness   Risk factors: no hx of cancer   Risk factors comment:  Denies IVDA no trauma/falls reported She can ambulate No h/o back surgery  Past Medical History  Diagnosis Date  . Seizures   . Bipolar disorder   . Chronic back pain    Past Surgical History  Procedure Laterality Date  . Throat surgery      throat surgery when younger doesn't know why   No family history on file. History  Substance Use Topics  . Smoking status: Current Every Day Smoker -- 1.00 packs/day for 4 years    Types: Cigarettes  . Smokeless tobacco: Not on file  . Alcohol Use: No   OB History   Grav Para Term Preterm Abortions TAB SAB Ect Mult Living                 Review of Systems  Constitutional: Negative for fever.  Gastrointestinal: Negative for abdominal pain and bowel incontinence.  Genitourinary: Negative for bladder incontinence and dysuria.  Musculoskeletal: Positive for back pain.  Neurological: Negative for weakness.      Allergies  Benadryl and Penicillins  Home Medications   Prior to Admission medications   Medication Sig Start Date End Date Taking? Authorizing Provider  ibuprofen (ADVIL,MOTRIN) 200 MG tablet Take 200 mg by mouth every 6 (six) hours as needed for pain.    Historical Provider, MD  Levothyroxine Sodium (SYNTHROID PO) Take 1 tablet by mouth  daily.    Historical Provider, MD  Lurasidone HCl (LATUDA PO) Take 1 tablet by mouth daily.    Historical Provider, MD  naproxen (NAPROSYN) 500 MG tablet Take 1 tablet (500 mg total) by mouth 2 (two) times daily. 07/11/14   Antony Madura, PA-C  traZODone (DESYREL) 50 MG tablet Take 1 tablet (50 mg total) by mouth at bedtime and may repeat dose one time if needed. 09/11/13   Fransisca Kaufmann, NP   BP 104/81  Pulse 68  Temp(Src) 98 F (36.7 C) (Oral)  Resp 18  Ht  (1.499 m)  Wt 140 lb (63.504 kg)  BMI 28.26 kg/m2  SpO2 100%  LMP 07/10/2014 Physical Exam CONSTITUTIONAL: Well developed/well nourished HEAD: Normocephalic/atraumatic EYES: EOMI/PERRL ENMT: Mucous membranes moist NECK: supple no meningeal signs SPINE:diffuse thoracic and lumbar tenderness No bruising/crepitance/stepoffs noted to spine CV: S1/S2 noted, no murmurs/rubs/gallops noted LUNGS: Lungs are clear to auscultation bilaterally, no apparent distress ABDOMEN: soft, nontender, no rebound or guarding GU:no cva tenderness NEURO: Awake/alert,  equal motor 5/5 strength noted with the following: hip flexion/knee flexion/extension, foot dorsi/plantar flexion, great toe extension intact bilaterally, no clonus bilaterally,   Pt is able to ambulate unassisted. EXTREMITIES: pulses normal, full ROM SKIN: warm, color normal PSYCH: no abnormalities of mood noted   ED Course  Procedures   MDM   Final diagnoses:  Midline  low back pain without sciatica  Midline thoracic back pain    Nursing notes including past medical history and social history reviewed and considered in documentation Previous records reviewed and considered     Joya Gaskins, MD 07/20/14 [phone]

## 2014-07-20 NOTE — Discharge Instructions (Signed)
SEEK IMMEDIATE MEDICAL ATTENTION IF: New numbness, tingling, weakness, or problem with the use of your arms or legs.  Severe back pain not relieved with medications.  Change in bowel or bladder control (if you lose control of stool or urine, or if you are unable to urinate) Increasing pain in any areas of the body (such as chest or abdominal pain).  Shortness of breath, dizziness or fainting.  Nausea (feeling sick to your stomach), vomiting, fever, or sweats.    Emergency Department Resource Guide 1) Find a Doctor and Pay Out of Pocket Although you won't have to find out who is covered by your insurance plan, it is a good idea to ask around and get recommendations. You will then need to call the office and see if the doctor you have chosen will accept you as a new patient and what types of options they offer for patients who are self-pay. Some doctors offer discounts or will set up payment plans for their patients who do not have insurance, but you will need to ask so you aren't surprised when you get to your appointment.  2) Contact Your Local Health Department Not all health departments have doctors that can see patients for sick visits, but many do, so it is worth a call to see if yours does. If you don't know where your local health department is, you can check in your phone book. The CDC also has a tool to help you locate your state's health department, and many state websites also have listings of all of their local health departments.  3) Find a Walk-in Clinic If your illness is not likely to be very severe or complicated, you may want to try a walk in clinic. These are popping up all over the country in pharmacies, drugstores, and shopping centers. They're usually staffed by nurse practitioners or physician assistants that have been trained to treat common illnesses and complaints. They're usually fairly quick and inexpensive. However, if you have serious medical issues or chronic medical  problems, these are probably not your best option.  No Primary Care Doctor: - Call Health Connect at  [phone] - they can help you locate a primary care doctor that  accepts your insurance, provides certain services, etc. - Physician Referral Service- [phone]  Chronic Pain Problems: Organization         Address  Phone   Notes  [facility]  [phone] Patients need to be referred by their primary care doctor.   Medication Assistance: Organization         Address  Phone   Notes  [facility] [address] [place], [place] [address] [phone] --Must be a resident of [facility] [place] -- Must have NO insurance coverage whatsoever (no Medicaid/ Medicare, etc.) -- The pt. MUST have a primary care doctor that directs their care regularly and follows them in the community   MedAssist  [phone]   [employer]  [phone]    Agencies that provide inexpensive medical care: Organization         Address  Phone   Notes  [facility]  [phone]   [facility]    [phone]   [facility] [address] [place], [place] [address] [phone]) [phone]   [facility] [address], [place] [phone]   [facility]    [phone]   [facility]    [phone]   [facility]  [address], [place] Phone:  [phone], Fax:  [phone]) [phone] Hours of Operation:  9 am - 6 pm, M-F.  Also accepts Medicaid/Medicare and self-pay.  [facility]  [address], [place] Phone: [phone], Fax: [phone]. Hours of Operation:  8:30 am - 5:30 pm, M-F.  Also accepts Medicaid and self-pay.  [facility] [place] [address], [place] Phone: [phone]) [phone]   [facility] [address], [place], [place] [phone], Ext. [phone]  Mondays & Thursdays: 7-9 AM.  First 15 patients are seen on a first come, first serve basis.    Medicaid-accepting [facility] Providers:  Organization         Address  Phone   Notes  [facility] [address], [place] [phone] Also accepts self-pay patients.  [facility] [address], [place]  [phone]   [facility] [address], [place] [phone]   [facility] [address] [place] [address], [place] [phone]   Renaye RakersVeita Bland [address], [place]   [phone] Only accepts [place] Access [place] patients after they have their name applied to their card.   Self-Pay (no insurance) in [facility] [place]:  Organization         Address  Phone   Notes  Sickle Cell Patients, [facility] [address], [place] [phone]) [phone]   [facility] [address], [place] [phone]) [phone]   [facility] [place]  [address], [place] [phone]   [facility]/Dr. Osei-Bonsu  [address] [place] [address], [place] or [phone], [place] [phone] Phone number for both [place] and [place] locations is the same.  [facility] [address], [place] [phone]   [facility] [address] [place] [address], [place] or [address] [place] [address] [phone]   [facility] [address], [place] [phone], phone; [phone], fax Sees patients 1st and 3rd Saturday of every month.  Must not qualify for public or private insurance (i.e. Medicaid, Medicare, [place] Health Choice, Veterans' Benefits)  Household income should be no more than 200% of the poverty level The clinic cannot treat you if you are pregnant or think you are pregnant  Sexually transmitted diseases are not treated at  the clinic.    Dental Care: Organization         Address  Phone  Notes  [facility] [address], [place] [phone] Accepts children up to age 25 who are enrolled in [place] or [place] Health Choice; pregnant women with a Medicaid card; and children who have applied for Medicaid or [place] Health Choice, but were declined, whose parents can pay a reduced fee at time of service.  [facility] [place]  [address], [place] [phone]) [phone] Accepts children up to age 25 who are enrolled in [place] or [place] Health Choice; pregnant women with a Medicaid card; and children who have applied for Medicaid or [place] Health Choice, but were declined, whose parents can pay a reduced fee at time of service.  [facility]  [address] [place], [place] [phone] Patients are seen by appointment only. Walk-ins are not accepted. [facility] will see patients 57 years of age and older. Monday - Tuesday (8am-5pm) Most Wednesdays (8:30-5pm) $30 per visit, cash on9[facility] Access PRO[facility]  [address], [facility] [phone] Patients are seen by appointment only. Walk-ins are not accepted. [facility] will see patients 70 years of age and older. One Wednesday Evening (Monthly: Volunteer Based).  $30 per visit, cash only  [employer]  [phone] for adults; Children under age 49, call Graduate Pediatric Dentistry at [phone]. Children aged 14-14, please call [phone] to request a pediatric application.  Dental services are provided in all areas of dental care including fillings, crowns and bridges, complete and partial dentures, implants, gum treatment, root canals, and extractions. Preventive care is also provided. Treatment is provided to both adults and children. Patients are selected via a lottery and there is often a  waiting list.   [facility] [address], [place]  [phone] [email or web address]   [facility] [address] [place], [place] [phone], Ext. [phone] Second and Fourth Thursday of each month, opens at 6:30 AM; Clinic ends at 9 AM.  Patients are seen on a first-come first-served basis, and a limited number are seen during each clinic.   [facility]  [address] [place], [place] [phone]   Eligibility Requirements You must have lived in [place], [place], or [place] counties for at least the last three months.   You cannot be eligible for state or federal sponsored [place], including [employer], [place], or [employer].   You generally cannot be eligible for healthcare insurance through your employer.    How to apply: Eligibility screenings are held every Tuesday and Wednesday afternoon from 1:00 pm until 4:00 pm. You do not need an appointment for the interview!  [facility] [address] [place], [place] [phone]   [facility]  [phone]   [facility] Health Department  [phone]   [facility] Health Department  [phone]    Behavioral Health Resources in the Community: Intensive Outpatient Programs Organization         Address  Phone  Notes  [facility] [address], [place], [place] [phone]   [facility] Outpatient [address] [place], [place] [phone]   [facility] [address] [place], [place]  [phone]   [facility] [address]  [place], [place] [phone] or [phone]   Substance Abuse Resources Organization         Address  Phone  Notes  [facility]  [phone]   [facility]  [phone]   [facility]  [phone]   Floydene Flock  [phone]   Residential & Outpatient Substance Abuse  Program  [phone]   Psychological Services Organization         Address  Phone  Notes  [facility]  [phone]   [facility] Services  [phone]   [facility] [address] [place] [phone] or [phone]    Mobile Crisis Teams Organization         Address  Phone  Notes  [facility], Mobile Crisis Care Unit  [phone]   [facility]  [address] [place], [place] [phone]   °Sharon DeEsch [address] °[place] [phone]   ° °Self-Help/Support Groups °Organization         Address  Phone             Notes  °[facility] - variety of support groups  336- [phone] Call for more information  °Narcotics Anonymous (NA), [facility] [address], °[place]  2 meetings at this location  ° °Residential Treatment Programs °Organization         Address  Phone  Notes  °[facility] [address],    °[place]  [phone]   °[facility] ° [address], [place], [place] [phone]   °[facility] [address], [place] [phone] Admissions: 8am-3pm M-F  °[facility] [address]    °[place], [place] [phone]   °[facility] [address], [place], [place] [phone]   °[facility] [address]  °[place], [place] [phone]   °[facility] - Intensive Outpatient [address], [place], [place] [phone]   °[facility] ([facility] [address]  °[place], [place] 1-877-615-2722 or [phone]   °[facility] ([facility]) [address] , [place] [phone] Accepts Medicaid  °[facility] [address]  °[place] 1-800-659-3381 Substance Abuse/Addiction Treatment  ° °[place] Behavioral Health Resources °Organization          Address  Phone  Notes  °[facility]  (888) [phone]   °Julie Brannon, PhD [address] [place], [place]   (336) [phone] or (336) 951-0000   °[facility]   [address] °[place], [place] (336) [phone]   °[facility] [address], [place], [place] (336) [phone] Insurance/Medicaid/sponsorship through [facility]  °[facility] [address]                                    [place], [place] ([phone]) [phone] Therapy/tele-psych/case  °[facility] [address]  ° [place], [place] (336) [phone]    °Dr. Arfeen  ([phone]) [phone]   °[facility] [place]  [employer] [place] [facility] 1) [address], [place] °2) [address], [place] °3)  [address], [place] (336) [phone] °(336) [phone] ° °(336) [phone]   °[place] Child Abuse Hotline (336) 342-1394 or (336) 342-3537 (After Hours)    ° ° ° °

## 2014-08-05 ENCOUNTER — Encounter (HOSPITAL_COMMUNITY): Payer: Self-pay | Admitting: Emergency Medicine

## 2014-08-05 ENCOUNTER — Emergency Department (HOSPITAL_COMMUNITY)
Admission: EM | Admit: 2014-08-05 | Discharge: 2014-08-05 | Disposition: A | Discharge status: Home / Self Care | Payer: Medicaid Other | Attending: Emergency Medicine | Admitting: Emergency Medicine

## 2014-08-05 DIAGNOSIS — L293 Anogenital pruritus, unspecified: Secondary | ICD-10-CM | POA: Diagnosis not present

## 2014-08-05 DIAGNOSIS — F319 Bipolar disorder, unspecified: Secondary | ICD-10-CM | POA: Diagnosis not present

## 2014-08-05 DIAGNOSIS — N898 Other specified noninflammatory disorders of vagina: Secondary | ICD-10-CM | POA: Insufficient documentation

## 2014-08-05 DIAGNOSIS — J069 Acute upper respiratory infection, unspecified: Secondary | ICD-10-CM | POA: Diagnosis not present

## 2014-08-05 DIAGNOSIS — Z791 Long term (current) use of non-steroidal anti-inflammatories (NSAID): Secondary | ICD-10-CM | POA: Insufficient documentation

## 2014-08-05 DIAGNOSIS — G8929 Other chronic pain: Secondary | ICD-10-CM | POA: Diagnosis not present

## 2014-08-05 DIAGNOSIS — Z880 Allergy status to penicillin: Secondary | ICD-10-CM | POA: Insufficient documentation

## 2014-08-05 DIAGNOSIS — Z720 Tobacco use: Secondary | ICD-10-CM | POA: Insufficient documentation

## 2014-08-05 DIAGNOSIS — J029 Acute pharyngitis, unspecified: Secondary | ICD-10-CM | POA: Diagnosis present

## 2014-08-05 DIAGNOSIS — Z79899 Other long term (current) drug therapy: Secondary | ICD-10-CM | POA: Diagnosis not present

## 2014-08-05 DIAGNOSIS — B9789 Other viral agents as the cause of diseases classified elsewhere: Secondary | ICD-10-CM

## 2014-08-05 MED ORDER — IBUPROFEN 400 MG PO TABS
600.0000 mg | ORAL_TABLET | Freq: Once | ORAL | Status: AC
  Administered 2014-08-05: 600 mg via ORAL
  Filled 2014-08-05 (×2): qty 1

## 2014-08-05 MED ORDER — GUAIFENESIN-CODEINE 100-10 MG/5ML PO SOLN
10.0000 mL | Freq: Once | ORAL | Status: AC
  Administered 2014-08-05: 10 mL via ORAL
  Filled 2014-08-05: qty 10

## 2014-08-05 MED ORDER — GUAIFENESIN-CODEINE 100-10 MG/5ML PO SOLN: 5.0000 mL | Freq: Three times a day (TID) | ORAL | Status: DC | PRN

## 2014-08-05 MED ORDER — FLUCONAZOLE 100 MG PO TABS
200.0000 mg | ORAL_TABLET | Freq: Once | ORAL | Status: AC
Start: 2014-08-05 — End: 2014-08-05
  Administered 2014-08-05: 200 mg via ORAL
  Filled 2014-08-05: qty 2

## 2014-08-05 MED ORDER — DEXAMETHASONE 4 MG PO TABS
8.0000 mg | ORAL_TABLET | Freq: Once | ORAL | Status: AC
  Administered 2014-08-05: 8 mg via ORAL
  Filled 2014-08-05: qty 2

## 2014-08-05 NOTE — ED Notes (Signed)
Pt is from home, pt c/o a nagging cough, sore throat, headache, and vaginal discharge. Pt believes she may have a yeast infection and would like to be seen for that as well. Pt reports her cough started yesterday and her throat started hurting her this morning. Pt states she doesn't feel well. A&OX4.

## 2014-08-05 NOTE — ED Notes (Signed)
MD at bedside. 

## 2014-08-05 NOTE — Discharge Instructions (Signed)
Cough, Adult ° A cough is a reflex that helps clear your throat and airways. It can help heal the body or may be a reaction to an irritated airway. A cough may only last 2 or 3 weeks (acute) or may last more than 8 weeks (chronic).  °CAUSES °Acute cough: °· Viral or bacterial infections. °Chronic cough: °· Infections. °· Allergies. °· Asthma. °· Post-nasal drip. °· Smoking. °· Heartburn or acid reflux. °· Some medicines. °· Chronic lung problems (COPD). °· Cancer. °SYMPTOMS  °· Cough. °· Fever. °· Chest pain. °· Increased breathing rate. °· High-pitched whistling sound when breathing (wheezing). °· Colored mucus that you cough up (sputum). °TREATMENT  °· A bacterial cough may be treated with antibiotic medicine. °· A viral cough must run its course and will not respond to antibiotics. °· Your caregiver may recommend other treatments if you have a chronic cough. °HOME CARE INSTRUCTIONS  °· Only take over-the-counter or prescription medicines for pain, discomfort, or fever as directed by your caregiver. Use cough suppressants only as directed by your caregiver. °· Use a cold steam vaporizer or humidifier in your bedroom or home to help loosen secretions. °· Sleep in a semi-upright position if your cough is worse at night. °· Rest as needed. °· Stop smoking if you smoke. °SEEK IMMEDIATE MEDICAL CARE IF:  °· You have pus in your sputum. °· Your cough starts to worsen. °· You cannot control your cough with suppressants and are losing sleep. °· You begin coughing up blood. °· You have difficulty breathing. °· You develop pain which is getting worse or is uncontrolled with medicine. °· You have a fever. °MAKE SURE YOU:  °· Understand these instructions. °· Will watch your condition. °· Will get help right away if you are not doing well or get worse. °Document Released: 04/16/2011 Document Revised: 01/10/2012 Document Reviewed: 04/16/2011 °ExitCare® Patient Information ©2015 [employer]. This information is not intended  to replace advice given to you by your health care provider. Make sure you discuss any questions you have with your health care provider. °Upper Respiratory Infection, Adult °An upper respiratory infection (URI) is also sometimes known as the common cold. The upper respiratory tract includes the nose, sinuses, throat, trachea, and bronchi. Bronchi are the airways leading to the lungs. Most people improve within 1 week, but symptoms can last up to 2 weeks. A residual cough may last even longer.  °CAUSES °Many different viruses can infect the tissues lining the upper respiratory tract. The tissues become irritated and inflamed and often become very moist. Mucus production is also common. A cold is contagious. You can easily spread the virus to others by oral contact. This includes kissing, sharing a glass, coughing, or sneezing. Touching your mouth or nose and then touching a surface, which is then touched by another person, can also spread the virus. °SYMPTOMS  °Symptoms typically develop 1 to 3 days after you come in contact with a cold virus. Symptoms vary from person to person. They may include: °· Runny nose. °· Sneezing. °· Nasal congestion. °· Sinus irritation. °· Sore throat. °· Loss of voice (laryngitis). °· Cough. °· Fatigue. °· Muscle aches. °· Loss of appetite. °· Headache. °· Low-grade fever. °DIAGNOSIS  °You might diagnose your own cold based on familiar symptoms, since most people get a cold 2 to 3 times a year. Your caregiver can confirm this based on your exam. Most importantly, your caregiver can check that your symptoms are not due to another disease such   as strep throat, sinusitis, pneumonia, asthma, or epiglottitis. Blood tests, throat tests, and X-rays are not necessary to diagnose a common cold, but they may sometimes be helpful in excluding other more serious diseases. Your caregiver will decide if any further tests are required. °RISKS AND COMPLICATIONS  °You may be at risk for a more severe  case of the common cold if you smoke cigarettes, have chronic heart disease (such as heart failure) or lung disease (such as asthma), or if you have a weakened immune system. The very young and very old are also at risk for more serious infections. Bacterial sinusitis, middle ear infections, and bacterial pneumonia can complicate the common cold. The common cold can worsen asthma and chronic obstructive pulmonary disease (COPD). Sometimes, these complications can require emergency medical care and may be life-threatening. °PREVENTION  °The best way to protect against getting a cold is to practice good hygiene. Avoid oral or hand contact with people with cold symptoms. Wash your hands often if contact occurs. There is no clear evidence that vitamin C, vitamin E, echinacea, or exercise reduces the chance of developing a cold. However, it is always recommended to get plenty of rest and practice good nutrition. °TREATMENT  °Treatment is directed at relieving symptoms. There is no cure. Antibiotics are not effective, because the infection is caused by a virus, not by bacteria. Treatment may include: °· Increased fluid intake. Sports drinks offer valuable electrolytes, sugars, and fluids. °· Breathing heated mist or steam (vaporizer or shower). °· Eating chicken soup or other clear broths, and maintaining good nutrition. °· Getting plenty of rest. °· Using gargles or lozenges for comfort. °· Controlling fevers with ibuprofen or acetaminophen as directed by your caregiver. °· Increasing usage of your inhaler if you have asthma. °Zinc gel and zinc lozenges, taken in the first 24 hours of the common cold, can shorten the duration and lessen the severity of symptoms. Pain medicines may help with fever, muscle aches, and throat pain. A variety of non-prescription medicines are available to treat congestion and runny nose. Your caregiver can make recommendations and may suggest nasal or lung inhalers for other symptoms.  °HOME  CARE INSTRUCTIONS  °· Only take over-the-counter or prescription medicines for pain, discomfort, or fever as directed by your caregiver. °· Use a warm mist humidifier or inhale steam from a shower to increase air moisture. This may keep secretions moist and make it easier to breathe. °· Drink enough water and fluids to keep your urine clear or pale yellow. °· Rest as needed. °· Return to work when your temperature has returned to normal or as your caregiver advises. You may need to stay home longer to avoid infecting others. You can also use a face mask and careful hand washing to prevent spread of the virus. °SEEK MEDICAL CARE IF:  °· After the first few days, you feel you are getting worse rather than better. °· You need your caregiver's advice about medicines to control symptoms. °· You develop chills, worsening shortness of breath, or brown or red sputum. These may be signs of pneumonia. °· You develop yellow or brown nasal discharge or pain in the face, especially when you bend forward. These may be signs of sinusitis. °· You develop a fever, swollen neck glands, pain with swallowing, or white areas in the back of your throat. These may be signs of strep throat. °SEEK IMMEDIATE MEDICAL CARE IF:  °· You have a fever. °· You develop severe or persistent headache, ear   pain, sinus pain, or chest pain. °· You develop wheezing, a prolonged cough, cough up blood, or have a change in your usual mucus (if you have chronic lung disease). °· You develop sore muscles or a stiff neck. °Document Released: 04/13/2001 Document Revised: 01/10/2012 Document Reviewed: 01/23/2014 °ExitCare® Patient Information ©2015 [employer]Care, LLC. This information is not intended to replace advice given to you by your health care provider. Make sure you discuss any questions you have with your health care provider. ° °

## 2014-08-14 NOTE — ED Provider Notes (Signed)
CSN: [id number]     Arrival date & time 08/05/14  0319 History   First MD Initiated Contact with Patient 08/05/14 [phone]     Chief Complaint  Patient presents with  . Sore Throat  . Headache  . Vaginal Discharge     (Consider location/radiation/quality/duration/timing/severity/associated sxs/prior Treatment) HPI  23 year old female with URI symptoms. Symptom onset days ago. Relatively constant. She describes persistent cough which is nonproductive. Sure throat. Headaches. Feels generally weak. No fever or chills. No nausea or vomiting. No abdominal pain. No neck pain or stiffness. No dizziness or lightheadedness. She usually feels she may have a yeast infection and she is having thick, white vaginal discharge and itching. She states same symptoms with previous vaginal yeast infections.  Past Medical History  Diagnosis Date  . Seizures   . Bipolar disorder   . Chronic back pain    Past Surgical History  Procedure Laterality Date  . Throat surgery      throat surgery when younger doesn't know why   No family history on file. History  Substance Use Topics  . Smoking status: Current Every Day Smoker -- 1.00 packs/day for 4 years    Types: Cigarettes  . Smokeless tobacco: Not on file  . Alcohol Use: No   OB History   Grav Para Term Preterm Abortions TAB SAB Ect Mult Living                 Review of Systems  All systems reviewed and negative, other than as noted in HPI.   Allergies  Benadryl and Penicillins  Home Medications   Prior to Admission medications   Medication Sig Start Date End Date Taking? Authorizing Provider  guaiFENesin-codeine 100-10 MG/5ML syrup Take 5-10 mLs by mouth 3 (three) times daily as needed for cough. 08/05/14   Raeford RazorStephen Hind Chesler, MD  ibuprofen (ADVIL,MOTRIN) 200 MG tablet Take 200 mg by mouth every 6 (six) hours as needed for pain.    Historical Provider, MD  Levothyroxine Sodium (SYNTHROID PO) Take 1 tablet by mouth daily.    Historical Provider,  MD  Lurasidone HCl (LATUDA PO) Take 1 tablet by mouth daily.    Historical Provider, MD  naproxen (NAPROSYN) 500 MG tablet Take 1 tablet (500 mg total) by mouth 2 (two) times daily. 07/11/14   Antony MaduraKelly Humes, PA-C  traZODone (DESYREL) 50 MG tablet Take 1 tablet (50 mg total) by mouth at bedtime and may repeat dose one time if needed. 09/11/13   Fransisca KaufmannLaura Davis, NP   BP 113/77  Pulse 67  Temp(Src) 98.5 F (36.9 C) (Oral)  Resp 18  Ht 4\' 11"  (1.499 m)  Wt 141 lb (63.957 kg)  BMI 28.46 kg/m2  SpO2 100%  LMP 07/28/2014 Physical Exam  Nursing note and vitals reviewed. Constitutional: She appears well-developed and well-nourished. No distress.  HENT:  Head: Normocephalic and atraumatic.  Eyes: Conjunctivae are normal. Right eye exhibits no discharge. Left eye exhibits no discharge.  Neck: Neck supple.  Cardiovascular: Normal rate, regular rhythm and normal heart sounds.  Exam reveals no gallop and no friction rub.   No murmur heard. Pulmonary/Chest: Effort normal and breath sounds normal.  Abdominal: Soft. She exhibits no distension. There is no tenderness.  Musculoskeletal: She exhibits no edema and no tenderness.  Lower extremities symmetric as compared to each other. No calf tenderness. Negative Homan's. No palpable cords.   Neurological: She is alert.  Skin: Skin is warm and dry.  Psychiatric: She has a normal mood and affect.  Her behavior is normal. Thought content normal.    ED Course  Procedures (including critical care time) Labs Review Labs Reviewed - No data to display  Imaging Review No results found.   EKG Interpretation None      MDM   Final diagnoses:  Viral URI with cough    23 year old female with likely viral URI. She appears well. Afebrile. He is stable. No increased work of breathing. Plan symptomatic treatment. Return precautions discussed.    Raeford RazorStephen Taro Hidrogo, MD 08/14/14 1355

## 2014-09-13 ENCOUNTER — Emergency Department (HOSPITAL_COMMUNITY)
Admission: EM | Admit: 2014-09-13 | Discharge: 2014-09-13 | Disposition: A | Discharge status: Home / Self Care | Payer: Medicaid Other | Attending: Emergency Medicine | Admitting: Emergency Medicine

## 2014-09-13 ENCOUNTER — Emergency Department (HOSPITAL_COMMUNITY): Payer: Medicaid Other

## 2014-09-13 ENCOUNTER — Encounter (HOSPITAL_COMMUNITY): Payer: Self-pay | Admitting: *Deleted

## 2014-09-13 DIAGNOSIS — Z8659 Personal history of other mental and behavioral disorders: Secondary | ICD-10-CM | POA: Insufficient documentation

## 2014-09-13 DIAGNOSIS — Z8669 Personal history of other diseases of the nervous system and sense organs: Secondary | ICD-10-CM | POA: Diagnosis not present

## 2014-09-13 DIAGNOSIS — R0789 Other chest pain: Secondary | ICD-10-CM

## 2014-09-13 DIAGNOSIS — G8929 Other chronic pain: Secondary | ICD-10-CM | POA: Insufficient documentation

## 2014-09-13 DIAGNOSIS — W01198A Fall on same level from slipping, tripping and stumbling with subsequent striking against other object, initial encounter: Secondary | ICD-10-CM | POA: Insufficient documentation

## 2014-09-13 DIAGNOSIS — Y92091 Bathroom in other non-institutional residence as the place of occurrence of the external cause: Secondary | ICD-10-CM | POA: Diagnosis not present

## 2014-09-13 DIAGNOSIS — S299XXA Unspecified injury of thorax, initial encounter: Secondary | ICD-10-CM | POA: Diagnosis present

## 2014-09-13 DIAGNOSIS — Y93E1 Activity, personal bathing and showering: Secondary | ICD-10-CM | POA: Diagnosis not present

## 2014-09-13 DIAGNOSIS — Z791 Long term (current) use of non-steroidal anti-inflammatories (NSAID): Secondary | ICD-10-CM | POA: Insufficient documentation

## 2014-09-13 DIAGNOSIS — Z880 Allergy status to penicillin: Secondary | ICD-10-CM | POA: Insufficient documentation

## 2014-09-13 DIAGNOSIS — S3992XA Unspecified injury of lower back, initial encounter: Secondary | ICD-10-CM | POA: Insufficient documentation

## 2014-09-13 DIAGNOSIS — Y998 Other external cause status: Secondary | ICD-10-CM | POA: Diagnosis not present

## 2014-09-13 DIAGNOSIS — Z720 Tobacco use: Secondary | ICD-10-CM | POA: Insufficient documentation

## 2014-09-13 DIAGNOSIS — W182XXA Fall in (into) shower or empty bathtub, initial encounter: Secondary | ICD-10-CM | POA: Insufficient documentation

## 2014-09-13 HISTORY — DX: Attention-deficit hyperactivity disorder, unspecified type: F90.9

## 2014-09-13 MED ORDER — TRAMADOL HCL 50 MG PO TABS
50.0000 mg | ORAL_TABLET | Freq: Once | ORAL | Status: AC
  Administered 2014-09-13: 50 mg via ORAL
  Filled 2014-09-13: qty 1

## 2014-09-13 MED ORDER — TRAMADOL HCL 50 MG PO TABS: 50.0000 mg | ORAL_TABLET | Freq: Four times a day (QID) | ORAL | Status: DC | PRN

## 2014-09-13 NOTE — ED Provider Notes (Signed)
CSN: [id number]     Arrival date & time 09/13/14  1542 History  This chart was scribed for non-physician practitioner working with Tami BuccoMelanie Belfi, MD by Elveria Risingimelie Horne, ED Scribe. This patient was seen in room TR06C/TR06C and the patient's care was started at 5:20 PM.   Chief Complaint  Patient presents with  . Fall   HPI HPI Comments: Tami [name] is a 23 y.o. female who presents to the Emergency Department complaining of bilateral rib pain and central chest pain after a fall in the bathtub last night while taking a shower. Patient's fall attributed slipping on soapy water; no syncopal episode. Patient reports falling forward and landing on the side of the edge of the tub. Patient denies head injury or loss of consciousness as result of her falling. Patient reports pain in her lower back and chest. Patient reports exacerbated chest pain with deep breathing. Patient reports attempted treatment with Tylenol, without relief.   Past Medical History  Diagnosis Date  . Seizures   . Bipolar disorder   . Chronic back pain   . ADHD (attention deficit hyperactivity disorder)    Past Surgical History  Procedure Laterality Date  . Throat surgery      throat surgery when younger doesn't know why   History reviewed. No pertinent family history. History  Substance Use Topics  . Smoking status: Current Every Day Smoker -- 1.00 packs/day for 4 years    Types: Cigarettes  . Smokeless tobacco: Not on file  . Alcohol Use: No   OB History    No data available     Review of Systems  Constitutional: Negative for fever and chills.  Respiratory: Negative for shortness of breath.   Cardiovascular: Positive for chest pain.  Musculoskeletal: Positive for back pain.  Skin: Negative for wound.  Neurological: Negative for syncope.  All other systems reviewed and are negative.   Allergies  Benadryl and Penicillins  Home Medications   Prior to Admission medications   Medication Sig Start Date End  Date Taking? Authorizing Provider  ibuprofen (ADVIL,MOTRIN) 200 MG tablet Take 200 mg by mouth every 6 (six) hours as needed for pain.   Yes Historical Provider, MD  guaiFENesin-codeine 100-10 MG/5ML syrup Take 5-10 mLs by mouth 3 (three) times daily as needed for cough. Patient not taking: Reported on 09/13/2014 08/05/14   Raeford RazorStephen Kohut, MD  naproxen (NAPROSYN) 500 MG tablet Take 1 tablet (500 mg total) by mouth 2 (two) times daily. Patient not taking: Reported on 09/13/2014 07/11/14   Antony MaduraKelly Humes, PA-C  traZODone (DESYREL) 50 MG tablet Take 1 tablet (50 mg total) by mouth at bedtime and may repeat dose one time if needed. Patient not taking: Reported on 09/13/2014 09/11/13   Fransisca KaufmannLaura Davis, NP   Triage Vitals: BP 132/95 mmHg  Pulse 85  Temp(Src) 97.9 F (36.6 C) (Oral)  Resp 18  Ht 4\' 11"  (1.499 m)  Wt 150 lb (68.04 kg)  BMI 30.28 kg/m2  SpO2 97%  LMP 08/13/2014   Physical Exam  Constitutional: She is oriented to person, place, and time. She appears well-developed and well-nourished. No distress.  HENT:  Head: Normocephalic and atraumatic.  Eyes: EOM are normal.  Neck: Neck supple. No tracheal deviation present.  Cardiovascular: Normal rate.   Pulmonary/Chest: Effort normal. No respiratory distress.  Anterior chest wall tenderness to right of sternum.   Musculoskeletal: Normal range of motion.  Neurological: She is alert and oriented to person, place, and time.  Skin: Skin is warm  and dry.  Psychiatric: She has a normal mood and affect. Her behavior is normal.  Nursing note and vitals reviewed.   ED Course  Procedures (including critical care time)  COORDINATION OF CARE: 5:26 PM- Will prescribe pain medication. Discussed treatment plan with patient at bedside and patient agreed to plan.   Labs Review Labs Reviewed - No data to display  Imaging Review No results found.   EKG Interpretation None     Radiology results reviewed and shared with patient. Analgesic,  incentive spirometer. Return precautions discussed. MDM   Final diagnoses:  Chest wall pain      I personally performed the services described in this documentation, which was scribed in my presence. The recorded information has been reviewed and is accurate.    Jimmye Normanavid John Serinity Ware, NP 09/14/14 [id number]  Tami BuccoMelanie Belfi, MD 09/14/14 [phone]

## 2014-09-13 NOTE — Discharge Instructions (Signed)

## 2014-09-13 NOTE — ED Notes (Signed)
Pt reports falling in the bathtub last night and hit her sternum on the side of bathtub and now having bilateral rib pain. No acute distress noted at triage.

## 2014-09-13 NOTE — ED Notes (Signed)
Pt c/o bilateral rib pain and sternum pain after falling in the bathtub last night. Denies LOC

## 2014-10-28 ENCOUNTER — Emergency Department (HOSPITAL_COMMUNITY)
Admission: EM | Admit: 2014-10-28 | Discharge: 2014-10-28 | Discharge status: Against Medical Advice | Payer: Medicaid Other | Attending: Emergency Medicine | Admitting: Emergency Medicine

## 2014-10-28 ENCOUNTER — Encounter (HOSPITAL_COMMUNITY): Payer: Self-pay

## 2014-10-28 ENCOUNTER — Emergency Department (HOSPITAL_COMMUNITY): Payer: Medicaid Other

## 2014-10-28 DIAGNOSIS — G8929 Other chronic pain: Secondary | ICD-10-CM | POA: Insufficient documentation

## 2014-10-28 DIAGNOSIS — J029 Acute pharyngitis, unspecified: Secondary | ICD-10-CM | POA: Insufficient documentation

## 2014-10-28 DIAGNOSIS — R05 Cough: Secondary | ICD-10-CM | POA: Insufficient documentation

## 2014-10-28 DIAGNOSIS — Z720 Tobacco use: Secondary | ICD-10-CM | POA: Diagnosis not present

## 2014-10-28 DIAGNOSIS — R059 Cough, unspecified: Secondary | ICD-10-CM

## 2014-10-28 NOTE — ED Notes (Addendum)
Cough/congestion and cold symptoms with sore throat x 3 days.  No fever.  Pt has been able to eat / drink

## 2014-10-28 NOTE — ED Notes (Signed)
Pt called x3 with no answer from lobby.

## 2014-10-29 ENCOUNTER — Emergency Department (HOSPITAL_COMMUNITY)
Admission: EM | Admit: 2014-10-29 | Discharge: 2014-10-29 | Disposition: A | Discharge status: Home / Self Care | Payer: Medicaid Other | Attending: Emergency Medicine | Admitting: Emergency Medicine

## 2014-10-29 ENCOUNTER — Encounter (HOSPITAL_COMMUNITY): Payer: Self-pay | Admitting: Emergency Medicine

## 2014-10-29 DIAGNOSIS — R000 Tachycardia, unspecified: Secondary | ICD-10-CM | POA: Diagnosis not present

## 2014-10-29 DIAGNOSIS — Z880 Allergy status to penicillin: Secondary | ICD-10-CM | POA: Insufficient documentation

## 2014-10-29 DIAGNOSIS — J069 Acute upper respiratory infection, unspecified: Secondary | ICD-10-CM | POA: Insufficient documentation

## 2014-10-29 DIAGNOSIS — G8929 Other chronic pain: Secondary | ICD-10-CM | POA: Insufficient documentation

## 2014-10-29 DIAGNOSIS — Z8659 Personal history of other mental and behavioral disorders: Secondary | ICD-10-CM | POA: Diagnosis not present

## 2014-10-29 DIAGNOSIS — Z720 Tobacco use: Secondary | ICD-10-CM | POA: Insufficient documentation

## 2014-10-29 DIAGNOSIS — R0981 Nasal congestion: Secondary | ICD-10-CM | POA: Diagnosis present

## 2014-10-29 MED ORDER — ACETAMINOPHEN 325 MG PO TABS
650.0000 mg | ORAL_TABLET | Freq: Once | ORAL | Status: AC
  Administered 2014-10-29: 650 mg via ORAL

## 2014-10-29 MED ORDER — ACETAMINOPHEN 325 MG PO TABS
ORAL_TABLET | ORAL | Status: AC
  Filled 2014-10-29: qty 2

## 2014-10-29 NOTE — ED Provider Notes (Signed)
CSN: [id number]     Arrival date & time 10/29/14  0031 History   First MD Initiated Contact with Patient 10/29/14 0301     Chief Complaint  Patient presents with  . Sore Throat  . Nasal Congestion     (Consider location/radiation/quality/duration/timing/severity/associated sxs/prior Treatment) HPI Complains of cough, frontal headache nonproductive and nasal congestion and slight sore throat for the past 3 days. She is treated herself with Tylenol with partial relief. No vomiting no shortness of breath no other associated symptoms. Nothing makes symptoms better or worse.  Past Medical History  Diagnosis Date  . Seizures   . Bipolar disorder   . Chronic back pain   . ADHD (attention deficit hyperactivity disorder)    schizophrenia Past Surgical History  Procedure Laterality Date  . Throat surgery      throat surgery when younger doesn't know why   History reviewed. No pertinent family history. History  Substance Use Topics  . Smoking status: Current Every Day Smoker -- 1.00 packs/day for 4 years    Types: Cigarettes  . Smokeless tobacco: Not on file  . Alcohol Use: No   OB History    No data available     Review of Systems  Constitutional: Negative.   HENT: Positive for congestion, rhinorrhea and sore throat.   Respiratory: Positive for cough.   Cardiovascular: Negative.   Gastrointestinal: Negative.   Musculoskeletal: Negative.   Skin: Negative.   Neurological: Negative.   Psychiatric/Behavioral: Negative.   All other systems reviewed and are negative.     Allergies  Benadryl and Penicillins  Home Medications   Prior to Admission medications   Medication Sig Start Date End Date Taking? Authorizing Provider  guaiFENesin-codeine 100-10 MG/5ML syrup Take 5-10 mLs by mouth 3 (three) times daily as needed for cough. Patient not taking: Reported on 09/13/2014 08/05/14   Raeford RazorStephen Kohut, MD  ibuprofen (ADVIL,MOTRIN) 200 MG tablet Take 200 mg by mouth every 6 (six)  hours as needed for pain.    Historical Provider, MD  naproxen (NAPROSYN) 500 MG tablet Take 1 tablet (500 mg total) by mouth 2 (two) times daily. Patient not taking: Reported on 09/13/2014 07/11/14   Antony MaduraKelly Humes, PA-C  traMADol (ULTRAM) 50 MG tablet Take 1 tablet (50 mg total) by mouth every 6 (six) hours as needed. 09/13/14   Jimmye Normanavid John Smith, NP  traZODone (DESYREL) 50 MG tablet Take 1 tablet (50 mg total) by mouth at bedtime and may repeat dose one time if needed. Patient not taking: Reported on 09/13/2014 09/11/13   Fransisca KaufmannLaura Davis, NP   BP 119/78 mmHg  Pulse 105  Temp(Src) 98.5 F (36.9 C) (Oral)  Resp 16  SpO2 100%  LMP 10/05/2014 (Exact Date) Physical Exam  Constitutional: She appears well-developed and well-nourished. No distress.  HENT:  Head: Normocephalic and atraumatic.  Right Ear: External ear normal.  Left Ear: External ear normal.  Mouth/Throat: No oropharyngeal exudate.  Oropharynx minimally reddened. Nasal congestion bilateral tympanic membranes normal  Eyes: Conjunctivae are normal. Pupils are equal, round, and reactive to light.  Neck: Neck supple. No tracheal deviation present. No thyromegaly present.  Cardiovascular: Normal rate and regular rhythm.   No murmur heard. Mildly tachycardic  Pulmonary/Chest: Effort normal and breath sounds normal.  Abdominal: Soft. Bowel sounds are normal. She exhibits no distension. There is no tenderness.  Musculoskeletal: Normal range of motion. She exhibits no edema or tenderness.  Neurological: She is alert. Coordination normal.  Skin: Skin is warm and dry. No  rash noted.  Psychiatric: She has a normal mood and affect.  Nursing note and vitals reviewed.   ED Course  Procedures (including critical care time) Labs Review Labs Reviewed - No data to display  Imaging Review Dg Chest 2 View  10/28/2014   CLINICAL DATA:  Cough and congestion.  EXAM: CHEST  2 VIEW  COMPARISON:  Chest radiograph 09/13/2014  FINDINGS: Stable cardiac  and mediastinal contours. No consolidative pulmonary opacities. No pleural effusion or pneumothorax. Regional skeleton is unremarkable.  IMPRESSION: No acute cardiopulmonary process.   Electronically Signed   By: Annia Beltrew  Davis M.D.   On: 10/28/2014 19:28     EKG Interpretation None      MDM   symptoms consistent with URI . No further diagnostic testing needed Plan humidifier, Tylenol. Counseled patient for 5 minutes on smoking cessation  Diagnosis #1 upper respiratory infection  #2 tobacco abuse  Final diagnoses:  None        Doug SouSam Ruey Storer, MD 10/29/14 [phone]

## 2014-10-29 NOTE — ED Notes (Signed)
Pt states she has had nasal congestion and sore throat x 3 days

## 2014-10-29 NOTE — Discharge Instructions (Signed)
Upper Respiratory Infection, Adult Use a cool mist humidifier to help with congestion in your nose. Take Tylenol as directed for discomfort. See your primary care physician if not better in 7-10 days. Ask your primary care physician to help you to stop smoking An upper respiratory infection (URI) is also sometimes known as the common cold. The upper respiratory tract includes the nose, sinuses, throat, trachea, and bronchi. Bronchi are the airways leading to the lungs. Most people improve within 1 week, but symptoms can last up to 2 weeks. A residual cough may last even longer.  CAUSES Many different viruses can infect the tissues lining the upper respiratory tract. The tissues become irritated and inflamed and often become very moist. Mucus production is also common. A cold is contagious. You can easily spread the virus to others by oral contact. This includes kissing, sharing a glass, coughing, or sneezing. Touching your mouth or nose and then touching a surface, which is then touched by another person, can also spread the virus. SYMPTOMS  Symptoms typically develop 1 to 3 days after you come in contact with a cold virus. Symptoms vary from person to person. They may include:  Runny nose.  Sneezing.  Nasal congestion.  Sinus irritation.  Sore throat.  Loss of voice (laryngitis).  Cough.  Fatigue.  Muscle aches.  Loss of appetite.  Headache.  Low-grade fever. DIAGNOSIS  You might diagnose your own cold based on familiar symptoms, since most people get a cold 2 to 3 times a year. Your caregiver can confirm this based on your exam. Most importantly, your caregiver can check that your symptoms are not due to another disease such as strep throat, sinusitis, pneumonia, asthma, or epiglottitis. Blood tests, throat tests, and X-rays are not necessary to diagnose a common cold, but they may sometimes be helpful in excluding other more serious diseases. Your caregiver will decide if any  further tests are required. RISKS AND COMPLICATIONS  You may be at risk for a more severe case of the common cold if you smoke cigarettes, have chronic heart disease (such as heart failure) or lung disease (such as asthma), or if you have a weakened immune system. The very young and very old are also at risk for more serious infections. Bacterial sinusitis, middle ear infections, and bacterial pneumonia can complicate the common cold. The common cold can worsen asthma and chronic obstructive pulmonary disease (COPD). Sometimes, these complications can require emergency medical care and may be life-threatening. PREVENTION  The best way to protect against getting a cold is to practice good hygiene. Avoid oral or hand contact with people with cold symptoms. Wash your hands often if contact occurs. There is no clear evidence that vitamin C, vitamin E, echinacea, or exercise reduces the chance of developing a cold. However, it is always recommended to get plenty of rest and practice good nutrition. TREATMENT  Treatment is directed at relieving symptoms. There is no cure. Antibiotics are not effective, because the infection is caused by a virus, not by bacteria. Treatment may include:  Increased fluid intake. Sports drinks offer valuable electrolytes, sugars, and fluids.  Breathing heated mist or steam (vaporizer or shower).  Eating chicken soup or other clear broths, and maintaining good nutrition.  Getting plenty of rest.  Using gargles or lozenges for comfort.  Controlling fevers with ibuprofen or acetaminophen as directed by your caregiver.  Increasing usage of your inhaler if you have asthma. Zinc gel and zinc lozenges, taken in the first 24 hours  of the common cold, can shorten the duration and lessen the severity of symptoms. Pain medicines may help with fever, muscle aches, and throat pain. A variety of non-prescription medicines are available to treat congestion and runny nose. Your caregiver  can make recommendations and may suggest nasal or lung inhalers for other symptoms.  HOME CARE INSTRUCTIONS   Only take over-the-counter or prescription medicines for pain, discomfort, or fever as directed by your caregiver.  Use a warm mist humidifier or inhale steam from a shower to increase air moisture. This may keep secretions moist and make it easier to breathe.  Drink enough water and fluids to keep your urine clear or pale yellow.  Rest as needed.  Return to work when your temperature has returned to normal or as your caregiver advises. You may need to stay home longer to avoid infecting others. You can also use a face mask and careful hand washing to prevent spread of the virus. SEEK MEDICAL CARE IF:   After the first few days, you feel you are getting worse rather than better.  You need your caregiver's advice about medicines to control symptoms.  You develop chills, worsening shortness of breath, or brown or red sputum. These may be signs of pneumonia.  You develop yellow or brown nasal discharge or pain in the face, especially when you bend forward. These may be signs of sinusitis.  You develop a fever, swollen neck glands, pain with swallowing, or white areas in the back of your throat. These may be signs of strep throat. SEEK IMMEDIATE MEDICAL CARE IF:   You have a fever.  You develop severe or persistent headache, ear pain, sinus pain, or chest pain.  You develop wheezing, a prolonged cough, cough up blood, or have a change in your usual mucus (if you have chronic lung disease).  You develop sore muscles or a stiff neck. Document Released: 04/13/2001 Document Revised: 01/10/2012 Document Reviewed: 01/23/2014 [facility] Patient Information 2015 [place], [place]. This information is not intended to replace advice given to you by your health care provider. Make sure you discuss any questions you have with your health care provider.

## 2014-10-31 ENCOUNTER — Encounter (HOSPITAL_COMMUNITY): Payer: Self-pay | Admitting: Emergency Medicine

## 2014-10-31 ENCOUNTER — Emergency Department (HOSPITAL_COMMUNITY)
Admission: EM | Admit: 2014-10-31 | Discharge: 2014-10-31 | Disposition: A | Discharge status: Home / Self Care | Payer: Medicaid Other | Attending: Emergency Medicine | Admitting: Emergency Medicine

## 2014-10-31 DIAGNOSIS — Z8659 Personal history of other mental and behavioral disorders: Secondary | ICD-10-CM | POA: Diagnosis not present

## 2014-10-31 DIAGNOSIS — R05 Cough: Secondary | ICD-10-CM | POA: Diagnosis present

## 2014-10-31 DIAGNOSIS — Z880 Allergy status to penicillin: Secondary | ICD-10-CM | POA: Diagnosis not present

## 2014-10-31 DIAGNOSIS — J069 Acute upper respiratory infection, unspecified: Secondary | ICD-10-CM | POA: Diagnosis not present

## 2014-10-31 DIAGNOSIS — Z720 Tobacco use: Secondary | ICD-10-CM | POA: Insufficient documentation

## 2014-10-31 DIAGNOSIS — B9789 Other viral agents as the cause of diseases classified elsewhere: Secondary | ICD-10-CM

## 2014-10-31 DIAGNOSIS — G8929 Other chronic pain: Secondary | ICD-10-CM | POA: Insufficient documentation

## 2014-10-31 LAB — RAPID STREP SCREEN (MED CTR MEBANE ONLY): Streptococcus, Group A Screen (Direct): NEGATIVE

## 2014-10-31 MED ORDER — PREDNISONE 10 MG PO TABS: 20.0000 mg | ORAL_TABLET | Freq: Every day | ORAL | Status: DC

## 2014-10-31 MED ORDER — BENZONATATE 100 MG PO CAPS: 100.0000 mg | ORAL_CAPSULE | Freq: Three times a day (TID) | ORAL | Status: DC

## 2014-10-31 NOTE — ED Provider Notes (Signed)
CSN: [id number]     Arrival date & time 10/31/14  1741 History  This chart was scribed for non-physician practitioner working with Tilden FossaElizabeth Rees, MD by Elveria Risingimelie Horne, ED Scribe. This patient was seen in room TR10C/TR10C and the patient's care was started at 6:35 PM.   Chief Complaint  Patient presents with  . Cough   The history is provided by the patient. No language interpreter was used.   HPI Comments: Tami [name] is a 23 y.o. female with PMHx of Bipolar disorder, chronic back pain, and ADHD brought in by ambulance, who presents to the Emergency Department complaining of cold symptoms including productive cough with yellow sputum, sore throat, and rhinorrhea ongoing for five days. Patient reports increased pain with swallowing. Patient evaluated 12/28 and 12/29 for similar symptoms and [facility] ED. At last visit, patient diagnosed with URI; patient advised to treat congestion with cool humidifier, take Tylenol for her discomfort and follow up with her PCP. Patient reports treatment with Tylenol Cold, but denies relief. Patient denies nausea, abdominal pain, vomiting, diarrhea, or fever. Patient is an admitted every day smoker.   Past Medical History  Diagnosis Date  . Seizures   . Bipolar disorder   . Chronic back pain   . ADHD (attention deficit hyperactivity disorder)    Past Surgical History  Procedure Laterality Date  . Throat surgery      throat surgery when younger doesn't know why   No family history on file. History  Substance Use Topics  . Smoking status: Current Every Day Smoker -- 1.00 packs/day for 4 years    Types: Cigarettes  . Smokeless tobacco: Not on file  . Alcohol Use: No   OB History    No data available     Review of Systems  Constitutional: Negative for fever and chills.  HENT: Positive for congestion, rhinorrhea and sore throat.   Respiratory: Positive for cough.   Gastrointestinal: Negative for nausea, vomiting and diarrhea.   Allergies  Benadryl  and Penicillins  Home Medications   Prior to Admission medications   Medication Sig Start Date End Date Taking? Authorizing Provider  guaiFENesin-codeine 100-10 MG/5ML syrup Take 5-10 mLs by mouth 3 (three) times daily as needed for cough. Patient not taking: Reported on 09/13/2014 08/05/14   Raeford RazorStephen Kohut, MD  ibuprofen (ADVIL,MOTRIN) 200 MG tablet Take 200 mg by mouth every 6 (six) hours as needed for pain.    Historical Provider, MD  naproxen (NAPROSYN) 500 MG tablet Take 1 tablet (500 mg total) by mouth 2 (two) times daily. Patient not taking: Reported on 09/13/2014 07/11/14   Antony MaduraKelly Humes, PA-C  traMADol (ULTRAM) 50 MG tablet Take 1 tablet (50 mg total) by mouth every 6 (six) hours as needed. 09/13/14   Jimmye Normanavid John Rudolpho Claxton, NP  traZODone (DESYREL) 50 MG tablet Take 1 tablet (50 mg total) by mouth at bedtime and may repeat dose one time if needed. Patient not taking: Reported on 09/13/2014 09/11/13   Fransisca KaufmannLaura Davis, NP   Triage Vitals: BP 111/82 mmHg  Pulse 67  Temp(Src) 99.2 F (37.3 C) (Oral)  Resp 18  Ht 4\' 11"  (1.499 m)  Wt 151 lb (68.493 kg)  BMI 30.48 kg/m2  SpO2 95%  LMP 10/05/2014 (Exact Date) Physical Exam  Constitutional: She is oriented to person, place, and time. She appears well-developed and well-nourished. No distress.  HENT:  Head: Normocephalic and atraumatic.  Cobble stoning of oropharynx. No exudate. TMs are clear.   Eyes: EOM are normal.  Neck: Neck supple. No tracheal deviation present.  Cardiovascular: Normal rate.   Pulmonary/Chest: Effort normal and breath sounds normal. No respiratory distress. She has no wheezes. She has no rales.  Musculoskeletal: Normal range of motion.  Neurological: She is alert and oriented to person, place, and time.  Skin: Skin is warm and dry.  Psychiatric: She has a normal mood and affect. Her behavior is normal.  Nursing note and vitals reviewed.   ED Course  Procedures (including critical care time)  COORDINATION OF  CARE: 6:45 PM- Awaiting results of Strep screen. Discussed treatment plan with patient at bedside and patient agreed to plan.   Labs Review Labs Reviewed  RAPID STREP SCREEN    Imaging Review No results found.   EKG Interpretation None     Strep screen negative. Cough, no wheezing. Sore throat with PND.  Prednisone, tessalon.  MDM   Final diagnoses:  Viral URI with cough      I personally performed the services described in this documentation, which was scribed in my presence. The recorded information has been reviewed and is accurate.    Jimmye Normanavid John Pervis Macintyre, NP 10/31/14 1956  Tilden FossaElizabeth Rees, MD 10/31/14 2006

## 2014-10-31 NOTE — Discharge Instructions (Signed)
Upper Respiratory Infection, Adult An upper respiratory infection (URI) is also sometimes known as the common cold. The upper respiratory tract includes the nose, sinuses, throat, trachea, and bronchi. Bronchi are the airways leading to the lungs. Most people improve within 1 week, but symptoms can last up to 2 weeks. A residual cough may last even longer.  CAUSES Many different viruses can infect the tissues lining the upper respiratory tract. The tissues become irritated and inflamed and often become very moist. Mucus production is also common. A cold is contagious. You can easily spread the virus to others by oral contact. This includes kissing, sharing a glass, coughing, or sneezing. Touching your mouth or nose and then touching a surface, which is then touched by another person, can also spread the virus. SYMPTOMS  Symptoms typically develop 1 to 3 days after you come in contact with a cold virus. Symptoms vary from person to person. They may include:  Runny nose.  Sneezing.  Nasal congestion.  Sinus irritation.  Sore throat.  Loss of voice (laryngitis).  Cough.  Fatigue.  Muscle aches.  Loss of appetite.  Headache.  Low-grade fever. DIAGNOSIS  You might diagnose your own cold based on familiar symptoms, since most people get a cold 2 to 3 times a year. Your caregiver can confirm this based on your exam. Most importantly, your caregiver can check that your symptoms are not due to another disease such as strep throat, sinusitis, pneumonia, asthma, or epiglottitis. Blood tests, throat tests, and X-rays are not necessary to diagnose a common cold, but they may sometimes be helpful in excluding other more serious diseases. Your caregiver will decide if any further tests are required. RISKS AND COMPLICATIONS  You may be at risk for a more severe case of the common cold if you smoke cigarettes, have chronic heart disease (such as heart failure) or lung disease (such as asthma), or if  you have a weakened immune system. The very young and very old are also at risk for more serious infections. Bacterial sinusitis, middle ear infections, and bacterial pneumonia can complicate the common cold. The common cold can worsen asthma and chronic obstructive pulmonary disease (COPD). Sometimes, these complications can require emergency medical care and may be life-threatening. PREVENTION  The best way to protect against getting a cold is to practice good hygiene. Avoid oral or hand contact with people with cold symptoms. Wash your hands often if contact occurs. There is no clear evidence that vitamin C, vitamin E, echinacea, or exercise reduces the chance of developing a cold. However, it is always recommended to get plenty of rest and practice good nutrition. TREATMENT  Treatment is directed at relieving symptoms. There is no cure. Antibiotics are not effective, because the infection is caused by a virus, not by bacteria. Treatment may include:  Increased fluid intake. Sports drinks offer valuable electrolytes, sugars, and fluids.  Breathing heated mist or steam (vaporizer or shower).  Eating chicken soup or other clear broths, and maintaining good nutrition.  Getting plenty of rest.  Using gargles or lozenges for comfort.  Controlling fevers with ibuprofen or acetaminophen as directed by your caregiver.  Increasing usage of your inhaler if you have asthma. Zinc gel and zinc lozenges, taken in the first 24 hours of the common cold, can shorten the duration and lessen the severity of symptoms. Pain medicines may help with fever, muscle aches, and throat pain. A variety of non-prescription medicines are available to treat congestion and runny nose. Your caregiver   can make recommendations and may suggest nasal or lung inhalers for other symptoms.  HOME CARE INSTRUCTIONS   Only take over-the-counter or prescription medicines for pain, discomfort, or fever as directed by your  caregiver.  Use a warm mist humidifier or inhale steam from a shower to increase air moisture. This may keep secretions moist and make it easier to breathe.  Drink enough water and fluids to keep your urine clear or pale yellow.  Rest as needed.  Return to work when your temperature has returned to normal or as your caregiver advises. You may need to stay home longer to avoid infecting others. You can also use a face mask and careful hand washing to prevent spread of the virus. SEEK MEDICAL CARE IF:   After the first few days, you feel you are getting worse rather than better.  You need your caregiver's advice about medicines to control symptoms.  You develop chills, worsening shortness of breath, or brown or red sputum. These may be signs of pneumonia.  You develop yellow or brown nasal discharge or pain in the face, especially when you bend forward. These may be signs of sinusitis.  You develop a fever, swollen neck glands, pain with swallowing, or white areas in the back of your throat. These may be signs of strep throat. SEEK IMMEDIATE MEDICAL CARE IF:   You have a fever.  You develop severe or persistent headache, ear pain, sinus pain, or chest pain.  You develop wheezing, a prolonged cough, cough up blood, or have a change in your usual mucus (if you have chronic lung disease).  You develop sore muscles or a stiff neck. Document Released: 04/13/2001 Document Revised: 01/10/2012 Document Reviewed: 01/23/2014 ExitCare Patient Information 2015 [employer]. This information is not intended to replace advice given to you by your health care provider. Make sure you discuss any questions you have with your health care provider.  

## 2014-10-31 NOTE — ED Notes (Addendum)
Pt to ED via [facility] with c/o cold symptoms.  Pt [facility]/29 for same.  When I ask her what she was dx with pt st's "I dont know I threw away my papers like I also do. Pt also c/o sore throat

## 2014-11-03 LAB — CULTURE, GROUP A STREP

## 2014-11-12 LAB — URINALYSIS, COMPLETE
Bilirubin,UR: NEGATIVE
Blood: NEGATIVE
Glucose,UR: NEGATIVE mg/dL (ref 0–75)
Ketone: NEGATIVE
Nitrite: POSITIVE
Ph: 8 (ref 4.5–8.0)
Protein: NEGATIVE
RBC,UR: 1 /HPF (ref 0–5)
Specific Gravity: 1.009 (ref 1.003–1.030)
Squamous Epithelial: 3
WBC UR: 71 /HPF (ref 0–5)

## 2014-11-12 LAB — DRUG SCREEN, URINE

## 2014-11-12 LAB — CBC
HCT: 45 % (ref 35.0–47.0)
HGB: 14.4 g/dL (ref 12.0–16.0)
MCH: 26.6 pg (ref 26.0–34.0)
MCHC: 31.9 g/dL — ABNORMAL LOW (ref 32.0–36.0)
MCV: 83 fL (ref 80–100)
Platelet: 334 10*3/uL (ref 150–440)
RBC: 5.41 10*6/uL — ABNORMAL HIGH (ref 3.80–5.20)
RDW: 12.9 % (ref 11.5–14.5)
WBC: 8.8 10*3/uL (ref 3.6–11.0)

## 2014-11-12 LAB — COMPREHENSIVE METABOLIC PANEL
Albumin: 3.2 g/dL — ABNORMAL LOW (ref 3.4–5.0)
Alkaline Phosphatase: 161 U/L — ABNORMAL HIGH
Anion Gap: 5 — ABNORMAL LOW (ref 7–16)
BUN: 10 mg/dL (ref 7–18)
Bilirubin,Total: 0.3 mg/dL (ref 0.2–1.0)
Calcium, Total: 8.4 mg/dL — ABNORMAL LOW (ref 8.5–10.1)
Chloride: 107 mmol/L (ref 98–107)
Co2: 28 mmol/L (ref 21–32)
Creatinine: 0.84 mg/dL (ref 0.60–1.30)
EGFR (African American): >60
EGFR (Non-African Amer.): >60
Glucose: 91 mg/dL (ref 65–99)
Osmolality: 278 (ref 275–301)
Potassium: 3.9 mmol/L (ref 3.5–5.1)
SGOT(AST): 29 U/L (ref 15–37)
SGPT (ALT): 56 U/L
Sodium: 140 mmol/L (ref 136–145)
Total Protein: 7.5 g/dL (ref 6.4–8.2)

## 2014-11-12 LAB — ACETAMINOPHEN LEVEL: Acetaminophen: <2 ug/mL

## 2014-11-12 LAB — ETHANOL: Ethanol: <3 mg/dL

## 2014-11-12 LAB — SALICYLATE LEVEL: Salicylates, Serum: <1.7 mg/dL

## 2014-11-13 ENCOUNTER — Inpatient Hospital Stay: Payer: Self-pay | Admitting: Psychiatry

## 2014-11-15 LAB — HEMOGLOBIN A1C: Hemoglobin A1C: 5.1 % (ref 4.2–6.3)

## 2014-11-15 LAB — LIPID PANEL
Cholesterol: 114 mg/dL (ref 0–200)
HDL Cholesterol: 38 mg/dL — ABNORMAL LOW (ref 40–60)
Ldl Cholesterol, Calc: 61 mg/dL (ref 0–100)
Triglycerides: 77 mg/dL (ref 0–200)
VLDL Cholesterol, Calc: 15 mg/dL (ref 5–40)

## 2014-11-23 LAB — URINALYSIS, COMPLETE
Bilirubin,UR: NEGATIVE
Blood: NEGATIVE
Glucose,UR: NEGATIVE mg/dL (ref 0–75)
Ketone: NEGATIVE
Leukocyte Esterase: NEGATIVE
Nitrite: NEGATIVE
Ph: 7 (ref 4.5–8.0)
Protein: NEGATIVE
RBC,UR: 3 /HPF (ref 0–5)
Specific Gravity: 1.012 (ref 1.003–1.030)
Squamous Epithelial: 1
WBC UR: 1 /HPF (ref 0–5)

## 2014-11-23 LAB — COMPREHENSIVE METABOLIC PANEL
Albumin: 3.3 g/dL — ABNORMAL LOW (ref 3.4–5.0)
Alkaline Phosphatase: 153 U/L — ABNORMAL HIGH
Anion Gap: 7 (ref 7–16)
BUN: 12 mg/dL (ref 7–18)
Bilirubin,Total: 0.2 mg/dL (ref 0.2–1.0)
Calcium, Total: 9 mg/dL (ref 8.5–10.1)
Chloride: 105 mmol/L (ref 98–107)
Co2: 29 mmol/L (ref 21–32)
Creatinine: 0.9 mg/dL (ref 0.60–1.30)
EGFR (African American): >60
EGFR (Non-African Amer.): >60
Glucose: 70 mg/dL (ref 65–99)
Osmolality: 279 (ref 275–301)
Potassium: 3.8 mmol/L (ref 3.5–5.1)
SGOT(AST): 92 U/L — ABNORMAL HIGH (ref 15–37)
SGPT (ALT): 261 U/L — ABNORMAL HIGH
Sodium: 141 mmol/L (ref 136–145)
Total Protein: 7.5 g/dL (ref 6.4–8.2)

## 2014-11-23 LAB — DRUG SCREEN, URINE

## 2014-11-23 LAB — ETHANOL: Ethanol: <3 mg/dL

## 2014-11-23 LAB — CBC
HCT: 41.3 % (ref 35.0–47.0)
HGB: 13.3 g/dL (ref 12.0–16.0)
MCH: 26.5 pg (ref 26.0–34.0)
MCHC: 32.2 g/dL (ref 32.0–36.0)
MCV: 82 fL (ref 80–100)
Platelet: 312 10*3/uL (ref 150–440)
RBC: 5.03 10*6/uL (ref 3.80–5.20)
RDW: 13.7 % (ref 11.5–14.5)
WBC: 7.1 10*3/uL (ref 3.6–11.0)

## 2014-11-23 LAB — ACETAMINOPHEN LEVEL: Acetaminophen: <2 ug/mL

## 2014-11-23 LAB — SALICYLATE LEVEL: Salicylates, Serum: <1.7 mg/dL

## 2014-11-24 ENCOUNTER — Inpatient Hospital Stay: Payer: Self-pay | Admitting: Psychiatry

## 2014-11-27 LAB — HEPATIC FUNCTION PANEL A (ARMC)
Albumin: 3.1 g/dL — ABNORMAL LOW (ref 3.4–5.0)
Alkaline Phosphatase: 127 U/L — ABNORMAL HIGH (ref 46–116)
Bilirubin, Direct: <0.1 mg/dL (ref 0.0–0.2)
Bilirubin,Total: 0.2 mg/dL (ref 0.2–1.0)
SGOT(AST): 54 U/L — ABNORMAL HIGH (ref 15–37)
SGPT (ALT): 172 U/L — ABNORMAL HIGH (ref 14–63)
Total Protein: 6.8 g/dL (ref 6.4–8.2)

## 2014-11-29 LAB — COMPREHENSIVE METABOLIC PANEL
Albumin: 3.1 g/dL — ABNORMAL LOW (ref 3.4–5.0)
Alkaline Phosphatase: 129 U/L — ABNORMAL HIGH (ref 46–116)
Anion Gap: 7 (ref 7–16)
BUN: 8 mg/dL (ref 7–18)
Bilirubin,Total: 0.3 mg/dL (ref 0.2–1.0)
Calcium, Total: 8.9 mg/dL (ref 8.5–10.1)
Chloride: 105 mmol/L (ref 98–107)
Co2: 26 mmol/L (ref 21–32)
Creatinine: 0.6 mg/dL (ref 0.60–1.30)
EGFR (African American): >60
EGFR (Non-African Amer.): >60
Glucose: 89 mg/dL (ref 65–99)
Osmolality: 273 (ref 275–301)
Potassium: 4.5 mmol/L (ref 3.5–5.1)
SGOT(AST): 65 U/L — ABNORMAL HIGH (ref 15–37)
SGPT (ALT): 183 U/L — ABNORMAL HIGH (ref 14–63)
Sodium: 138 mmol/L (ref 136–145)
Total Protein: 6.6 g/dL (ref 6.4–8.2)

## 2015-01-01 ENCOUNTER — Emergency Department (HOSPITAL_COMMUNITY)
Admission: EM | Admit: 2015-01-01 | Discharge: 2015-01-03 | Disposition: A | Discharge status: Home / Self Care | Payer: Medicaid Other | Attending: Emergency Medicine | Admitting: Emergency Medicine

## 2015-01-01 ENCOUNTER — Encounter (HOSPITAL_COMMUNITY): Payer: Self-pay

## 2015-01-01 DIAGNOSIS — Z720 Tobacco use: Secondary | ICD-10-CM | POA: Insufficient documentation

## 2015-01-01 DIAGNOSIS — Z880 Allergy status to penicillin: Secondary | ICD-10-CM | POA: Insufficient documentation

## 2015-01-01 DIAGNOSIS — Z79899 Other long term (current) drug therapy: Secondary | ICD-10-CM | POA: Diagnosis not present

## 2015-01-01 DIAGNOSIS — F319 Bipolar disorder, unspecified: Secondary | ICD-10-CM | POA: Diagnosis not present

## 2015-01-01 DIAGNOSIS — R441 Visual hallucinations: Secondary | ICD-10-CM | POA: Insufficient documentation

## 2015-01-01 DIAGNOSIS — R440 Auditory hallucinations: Secondary | ICD-10-CM | POA: Diagnosis not present

## 2015-01-01 DIAGNOSIS — R45851 Suicidal ideations: Secondary | ICD-10-CM | POA: Diagnosis present

## 2015-01-01 DIAGNOSIS — Z7952 Long term (current) use of systemic steroids: Secondary | ICD-10-CM | POA: Diagnosis not present

## 2015-01-01 DIAGNOSIS — G8929 Other chronic pain: Secondary | ICD-10-CM | POA: Insufficient documentation

## 2015-01-01 LAB — CBC
HCT: 44.2 % (ref 36.0–46.0)
Hemoglobin: 14.5 g/dL (ref 12.0–15.0)
MCH: 26.6 pg (ref 26.0–34.0)
MCHC: 32.8 g/dL (ref 30.0–36.0)
MCV: 81 fL (ref 78.0–100.0)
Platelets: 372 10*3/uL (ref 150–400)
RBC: 5.46 MIL/uL — ABNORMAL HIGH (ref 3.87–5.11)
RDW: 13 % (ref 11.5–15.5)
WBC: 12 10*3/uL — ABNORMAL HIGH (ref 4.0–10.5)

## 2015-01-01 LAB — COMPREHENSIVE METABOLIC PANEL
ALT: 43 U/L — ABNORMAL HIGH (ref 0–35)
AST: 24 U/L (ref 0–37)
Albumin: 4.1 g/dL (ref 3.5–5.2)
Alkaline Phosphatase: 140 U/L — ABNORMAL HIGH (ref 39–117)
Anion gap: 11 (ref 5–15)
BUN: 7 mg/dL (ref 6–23)
CO2: 23 mmol/L (ref 19–32)
Calcium: 9.4 mg/dL (ref 8.4–10.5)
Chloride: 106 mmol/L (ref 96–112)
Creatinine, Ser: 0.67 mg/dL (ref 0.50–1.10)
GFR calc Af Amer: >90 mL/min (ref >90)
GFR calc non Af Amer: >90 mL/min (ref >90)
Glucose, Bld: 88 mg/dL (ref 70–99)
Potassium: 3.6 mmol/L (ref 3.5–5.1)
Sodium: 140 mmol/L (ref 135–145)
Total Bilirubin: 0.6 mg/dL (ref 0.3–1.2)
Total Protein: 8 g/dL (ref 6.0–8.3)

## 2015-01-01 LAB — ETHANOL: Alcohol, Ethyl (B): <5 mg/dL (ref 0–9)

## 2015-01-01 MED ORDER — ONDANSETRON HCL 4 MG PO TABS: 4.0000 mg | ORAL_TABLET | Freq: Three times a day (TID) | ORAL | Status: DC | PRN

## 2015-01-01 MED ORDER — LORAZEPAM 1 MG PO TABS
1.0000 mg | ORAL_TABLET | Freq: Three times a day (TID) | ORAL | Status: DC | PRN
  Administered 2015-01-01 – 2015-01-02 (×2): 1 mg via ORAL
  Filled 2015-01-01 (×2): qty 1

## 2015-01-01 MED ORDER — IBUPROFEN 200 MG PO TABS: 600.0000 mg | ORAL_TABLET | Freq: Three times a day (TID) | ORAL | Status: DC | PRN

## 2015-01-01 MED ORDER — ZOLPIDEM TARTRATE 5 MG PO TABS: 5.0000 mg | ORAL_TABLET | Freq: Every evening | ORAL | Status: DC | PRN

## 2015-01-01 MED ORDER — ACETAMINOPHEN 325 MG PO TABS: 650.0000 mg | ORAL_TABLET | ORAL | Status: DC | PRN

## 2015-01-01 NOTE — BH Assessment (Signed)
[facility]. Sent [facility] to: [facility]   Evelynn Hench, [place] Triage Specialist 01/01/2015 11:02 PM

## 2015-01-01 NOTE — ED Notes (Signed)
Pt given a sandwich and sprite 

## 2015-01-01 NOTE — ED Notes (Signed)
Security called to wand pt and belongings.  

## 2015-01-01 NOTE — ED Notes (Signed)
Pt here with [employer], [facility], she is paranoid and hallucinating, she laughs all the time and says the voices are telling her to harm herself.

## 2015-01-01 NOTE — ED Notes (Signed)
Pt said she couldn't urinate right now

## 2015-01-01 NOTE — ED Notes (Addendum)
Pt under IVC, presents with complaint of SI without a specific plan, HI if someone wants to hurt her, feeling hopeless.  Pt reports she used to abuse drugs in the past, will not elaborate further.  Pt reports she has been eval by a Psychiatrist in the past, but unable to state her diagnosis.  [facility], cooperative but anxious, will continue to monitor for safety.

## 2015-01-01 NOTE — BH Assessment (Addendum)
Tele Assessment Note   Tami [name] is an 24 y.o. female brought to [facility] ED under IVC. Per IVC: The respondent presents with increased paranoia and hallucinations. The respondent constantly laughs to herself, inappropriately displays bizarre behaviors. The respondent has been dx with bipolar disorder and listed her current medications as Prozac, Strattera, inderal and Lipitor. There respondent repeatedly falls from her chair and reports that voices are chasing her. There respondent is a danger to herself.   At the time of assessment pt was alert and agitated. She seemed very confused and upset that this Clinical research associate was asking her questions. She repeatedly stated she came here for hearing voices and did not understand why she had to listen to these questions, at times using foul language. Pt was oriented to person, place and situation. She reports auditory hallucinations telling her to do "crazy things" but refused to provide details. She states they tell her to hurt herself and others, she reports she does not think she will act on these thoughts, but reports she has attempted suicide in the past when she had command hallucinations. Pt denies current self-harm, but reports some cutting in the past. She denies current SA and refused to answer questions but told RN she used [facility] and cocaine in the past.   Pt reports she lives in a group home and has guardian, Ms. Marcelino Duster.   Pt reports she has been eating more and has gained an unspecified amount of weight. She refused to answer questions about sx of depression but reported she was previously dx with ADHD and Bipolar.   Pt refused to answer questions about anxiety, trauma or abuse. She reports she has a upcoming court date but refuses to answer what it is for. Pt reports she takes her medication as prescribed.   Family hx is unknown.  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 Seizures   . Bipolar disorder   . Chronic back pain   . ADHD (attention deficit hyperactivity disorder)    Axis IV: other psychosocial or environmental problems, problems with access to health care services and problems with primary support group Axis V: 21-30 behavior considerably influenced by delusions or hallucinations OR serious impairment in judgment, communication OR inability to function in almost all areas  Past Medical History:  Past Medical History  Diagnosis Date  . Seizures   . Bipolar disorder   . Chronic back pain   . ADHD (attention deficit hyperactivity disorder)     Past Surgical History  Procedure Laterality Date  . Throat surgery      throat surgery when younger doesn't know why    Family History: History reviewed. No pertinent family history.  Social History:  reports that she has been smoking Cigarettes.  She has a 4 pack-year smoking history. She does not have any smokeless tobacco history on file. 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 She reports that she does not drink alcohol.  Additional Social History:  Alcohol / Drug Use Pain Medications: SEE PTA Prescriptions: SEE PTA Over the Counter: SEE PTA History of alcohol / drug use?:  (UTA refused to answer, reported to RN [facility] and cocaine use in the past) Longest period of sobriety (when/how long): unknown Negative Consequences of Use:  (UTA) Withdrawal Symptoms:  (UTA)  CIWA: CIWA-Ar BP: 134/89 mmHg Pulse Rate: 87 COWS:  PATIENT STRENGTHS: (choose at least two) Ability for insight Communication skills  Allergies:  Allergies  Allergen Reactions  . Benadryl [Diphenhydramine]     Itch   . Penicillins     Hives     Home Medications:  (Not in a hospital admission)  OB/GYN Status:  No LMP recorded.  General Assessment Data Location of Assessment: [facility] Is this a Tele or Face-to-Face Assessment?:  Face-to-Face Is this an Initial Assessment or a Re-assessment for this encounter?: Initial Assessment Living Arrangements: Other (Comment) ([facility]) Can pt return to current living arrangement?: Yes Admission Status: Involuntary Is patient capable of signing voluntary admission?: No Transfer from: Other (Comment) [facility] ) Referral Source: Other Museum/gallery curator([facility] )     [facility] Living Arrangements: Other (Comment) ([facility]) Name of Psychiatrist: pt believes [facility] but not sure Name of Therapist: Denies  Education Status Is patient currently in school?: No Current Grade: NA Highest grade of school patient has completed: 8 Name of school: NA Contact person: Ms. Marcelino DusterMichelle per pt is guardian   Risk to self with the past 6 months Suicidal Ideation: Yes-Currently Present Suicidal Intent:  (denies intent) Is patient at risk for suicide?: Yes Suicidal Plan?:  (pt refused to answer) Access to Means:  (unknown) What has been your use of drugs/alcohol within the last 12 months?: UTA Previous Attempts/Gestures: Yes How many times?: 1 Other Self Harm Risks: none Triggers for Past Attempts: Hallucinations Intentional Self Injurious Behavior: Cutting Comment - Self Injurious Behavior: reports "I used to do that" Family Suicide History: No Recent stressful life event(s):  (denies) Persecutory voices/beliefs?: No Depression: Yes Depression Symptoms:  (refused to answer about sx) Substance abuse history and/or treatment for substance abuse?: Yes Suicide prevention information given to non-admitted patients: Yes  Risk to Others within the past 6 months Homicidal Ideation: No Thoughts of Harm to Others: Yes-Currently Present Comment - Thoughts of Harm to Others: refused to provide details but denied planning or intent Current Homicidal Intent: No Current Homicidal Plan: No Access to Homicidal Means: No Identified Victim:  unknown History of harm to others?: No Violent Behavior Description: denies Does patient have access to weapons?: No Criminal Charges Pending?: Yes Describe Pending Criminal Charges: refused to answer Does patient have a court date: Yes Court Date: 02/03/15  Psychosis Hallucinations: Auditory, With command Delusions: None noted  Mental Status Report Appear/Hygiene: In scrubs, Disheveled Eye Contact: Poor Motor Activity: Unremarkable Speech: Logical/coherent Level of Consciousness: Alert Mood: Irritable Affect: Labile Anxiety Level:  (refused to answer) Thought Processes: Coherent, Relevant Judgement: Partial Orientation: Person, Place, Time, Situation Obsessive Compulsive Thoughts/Behaviors: None  Cognitive Functioning Concentration: Decreased Memory: Recent Intact, Remote Intact IQ: Average Insight: Poor Impulse Control: Fair Appetite: Good Weight Loss: 0 Weight Gain:  (reports has gained weight ) Sleep: Unable to Assess Total Hours of Sleep:  (unknown) Vegetative Symptoms: Unable to Assess  ADLScreening [facility]) Patient's cognitive ability adequate to safely complete daily activities?: Yes Patient able to express need for assistance with ADLs?: Yes Independently performs ADLs?: Yes (appropriate for developmental age)  Prior Inpatient Therapy Prior Inpatient Therapy: Yes Prior Therapy Dates: multiple Prior [facility]ilty[facility] others unknown Reason for Treatment: psychosis  Prior Outpatient Therapy Prior Outpatient Therapy: Yes Prior Therapy Dates: current Prior Therapy Facilty/Provider(s): [facility]  Reason for Treatment: medication management   ADL Screening (condition at time of admission) Patient's cognitive ability adequate to safely complete daily activities?: Yes  Is the patient deaf or have difficulty hearing?: No Does the patient have difficulty seeing, even when wearing glasses/contacts?: No Does the patient  have difficulty concentrating, remembering, or making decisions?: Yes Patient able to express need for assistance with ADLs?: Yes Does the patient have difficulty dressing or bathing?: No Independently performs ADLs?: Yes (appropriate for developmental age) Does the patient have difficulty walking or climbing stairs?: No Weakness of Legs: None Weakness of Arms/Hands: None  Home Assistive Devices/Equipment Home Assistive Devices/Equipment: None    Abuse/Neglect Assessment (Assessment to be complete while patient is alone) Physical Abuse:  (refused to answer) Verbal Abuse:  (refused to answer) Sexual Abuse:  (refused to answer) Exploitation of patient/patient's resources:  (refused to answer) Self-Neglect:  (refused to answer) Values / Beliefs Cultural Requests During Hospitalization: None Spiritual Requests During Hospitalization: None   Advance Directives (For Healthcare) Does patient have an advance directive?: Yes Would patient like information on creating an advanced directive?: No - patient declined information Type of Advance Directive:  (reports she has a guardian "Ms Marcelino Duster")    Additional Information 1:1 In Past 12 Months?: No CIRT Risk: No Elopement Risk: No Does patient have medical clearance?: No (labs pending )     Disposition:  Per Donell Sievert, PA pt meets inpt criteria. TTS to seek placement as there are no current appropriate [facility] beds available. Informed Kaitlyn PA and nurse of plan to seek placement. Pt indicated "I'll stay if I need to."  Clista Bernhardt, [facility] Triage Specialist 01/01/2015 9:53 PM  Disposition Initial Assessment Completed for this Encounter: Yes  Montavious Wierzba M 01/01/2015 9:52 PM

## 2015-01-01 NOTE — ED Provider Notes (Signed)
CSN: [id number]     Arrival date & time 01/01/15  1934 History  This chart was scribed for non-physician practitioner, Tami Beck, PA-C working with Tami Canal, MD by Tami [name], ED scribe. This patient was seen in room [facility] and the patient's care was started at 9:00 PM   Chief Complaint  Patient presents with  . Suicidal   The history is provided by the patient. No language interpreter was used.    HPI Comments: Tami [name] is a 24 y.o. female who presents to the Emergency Department complaining of intermittent auditory hallucinations that started 2 months ago. She says the voices are telling her "crazy stuff."  Pt notes history of cutting herself and intermittent SI. She denies EtOH or drug abuse.  Pt also denies HI and visual hallucinations. Patient is a poor historian.   Past Medical History  Diagnosis Date  . Seizures   . Bipolar disorder   . Chronic back pain   . ADHD (attention deficit hyperactivity disorder)    Past Surgical History  Procedure Laterality Date  . Throat surgery      throat surgery when younger doesn't know why   History reviewed. No pertinent family history. History  Substance Use Topics  . Smoking status: Current Every Day Smoker -- 1.00 packs/day for 4 years    Types: Cigarettes  . Smokeless tobacco: Not on file  . Alcohol Use: No   OB History    No data available     Review of Systems  Psychiatric/Behavioral: Positive for suicidal ideas, hallucinations and self-injury.      Allergies  Benadryl and Penicillins  Home Medications   Prior to Admission medications   Medication Sig Start Date End Date Taking? Authorizing Provider  benzonatate (TESSALON) 100 MG capsule Take 1 capsule (100 mg total) by mouth every 8 (eight) hours. 10/31/14   Tami Norman, NP  guaiFENesin-codeine 100-10 MG/5ML syrup Take 5-10 mLs by mouth 3 (three) times daily as needed for cough. Patient not taking: Reported on 09/13/2014 08/05/14   Tami Razor, MD  ibuprofen (ADVIL,MOTRIN) 200 MG tablet Take 200 mg by mouth every 6 (six) hours as needed for pain.    Historical Provider, MD  naproxen (NAPROSYN) 500 MG tablet Take 1 tablet (500 mg total) by mouth 2 (two) times daily. Patient not taking: Reported on 09/13/2014 07/11/14   Tami Madura, PA-C  predniSONE (DELTASONE) 10 MG tablet Take 2 tablets (20 mg total) by mouth daily. 10/31/14   Tami Norman, NP  traMADol (ULTRAM) 50 MG tablet Take 1 tablet (50 mg total) by mouth every 6 (six) hours as needed. 09/13/14   Tami Norman, NP  traZODone (DESYREL) 50 MG tablet Take 1 tablet (50 mg total) by mouth at bedtime and may repeat dose one time if needed. Patient not taking: Reported on 09/13/2014 09/11/13   Tami Kaufmann, NP   BP 134/89 mmHg  Pulse 87  Temp(Src) 98.1 F (36.7 C) (Oral)  Resp 16  SpO2 100% Physical Exam  Constitutional: She appears well-developed and well-nourished. No distress.  HENT:  Head: Normocephalic and atraumatic.  Eyes: Conjunctivae and EOM are normal.  Neck: Neck supple. No tracheal deviation present.  Cardiovascular: Normal rate and regular rhythm.  Exam reveals no gallop and no friction rub.   No murmur heard. Pulmonary/Chest: Effort normal and breath sounds normal. No respiratory distress. She has no wheezes. She has no rales. She exhibits no tenderness.  Abdominal: Soft. There is no tenderness.  Musculoskeletal: Normal range of motion.  Neurological: She is alert.  Speech is goal-oriented. Moves limbs without ataxia.   Skin: Skin is warm and dry.  Psychiatric:  Odd behavior. Affect is generally pleasant. Patient will occasionally look confused.   Nursing note and vitals reviewed.   ED Course  Procedures  DIAGNOSTIC STUDIES: Oxygen Saturation is 100% on RA, normal by my interpretation.    COORDINATION OF CARE: 9:05 PM Discussed treatment plan with pt at bedside and pt agreed to plan.   Labs Review Labs Reviewed  CBC - Abnormal; Notable  for the following:    WBC 12.0 (*)    RBC 5.46 (*)    All other components within normal limits  COMPREHENSIVE METABOLIC PANEL - Abnormal; Notable for the following:    ALT 43 (*)    Alkaline Phosphatase 140 (*)    All other components within normal limits  ETHANOL  URINE RAPID DRUG SCREEN (HOSP PERFORMED)    Imaging Review No results found.   EKG Interpretation None      MDM   Final diagnoses:  Visual hallucinations  Auditory hallucination    9:09 PM Patient having visual and auditory hallucinations. Patient will have TTS evaluation.   I personally performed the services described in this documentation, which was scribed in my presence. The recorded information has been reviewed and is accurate.    Tami BeckKaitlyn Alek Borges, PA-C 01/01/15 2156  Tami Canalavid H Yao, MD 01/01/15 [phone]

## 2015-01-02 DIAGNOSIS — R440 Auditory hallucinations: Secondary | ICD-10-CM | POA: Diagnosis not present

## 2015-01-02 DIAGNOSIS — F319 Bipolar disorder, unspecified: Secondary | ICD-10-CM

## 2015-01-02 LAB — RAPID URINE DRUG SCREEN, HOSP PERFORMED
Amphetamines: NOT DETECTED
Barbiturates: NOT DETECTED
Benzodiazepines: NOT DETECTED
Cocaine: NOT DETECTED
Opiates: NOT DETECTED
Tetrahydrocannabinol: NOT DETECTED

## 2015-01-02 MED ORDER — QUETIAPINE FUMARATE 50 MG PO TABS
50.0000 mg | ORAL_TABLET | Freq: Every day | ORAL | Status: DC
  Administered 2015-01-02: 50 mg via ORAL
  Filled 2015-01-02: qty 1

## 2015-01-02 MED ORDER — QUETIAPINE FUMARATE 25 MG PO TABS
25.0000 mg | ORAL_TABLET | Freq: Two times a day (BID) | ORAL | Status: DC
  Administered 2015-01-02 – 2015-01-03 (×3): 25 mg via ORAL
  Filled 2015-01-02 (×3): qty 1

## 2015-01-02 MED ORDER — HYDROXYZINE HCL 25 MG PO TABS
25.0000 mg | ORAL_TABLET | Freq: Four times a day (QID) | ORAL | Status: DC | PRN
  Administered 2015-01-02: 25 mg via ORAL
  Filled 2015-01-02: qty 1

## 2015-01-02 MED ORDER — ATORVASTATIN CALCIUM 10 MG PO TABS
10.0000 mg | ORAL_TABLET | Freq: Every day | ORAL | Status: DC
  Administered 2015-01-02: 10 mg via ORAL
  Filled 2015-01-02 (×2): qty 1

## 2015-01-02 MED ORDER — PROPRANOLOL HCL 10 MG PO TABS
10.0000 mg | ORAL_TABLET | Freq: Three times a day (TID) | ORAL | Status: DC
  Administered 2015-01-02 – 2015-01-03 (×3): 10 mg via ORAL
  Filled 2015-01-02 (×6): qty 1

## 2015-01-02 MED ORDER — FLUOXETINE HCL 10 MG PO CAPS
10.0000 mg | ORAL_CAPSULE | Freq: Every day | ORAL | Status: DC
  Administered 2015-01-02 – 2015-01-03 (×2): 10 mg via ORAL
  Filled 2015-01-02 (×2): qty 1

## 2015-01-02 NOTE — Progress Notes (Signed)
[facility] CSW attempted to contact group home at [phone], left message. Pt shared that her guardian is Marcelino DusterMichelle with [employer]. CSW asked if [name], pt stated yes ma'am. CSW called patient guardian [phone] and left message.   CSW attempting to gain collateral information for patient. Pt currently calm and coopeartive resting in her bed, yes ma'am no ma'am to questions, and politiely asking for a coke or spirte.    Olga CoasterKristen Shanasia Ibrahim, LCSW  Clinical Social Work  [facility] [phone]  01/02/2015 10:15 AM

## 2015-01-02 NOTE — Progress Notes (Signed)
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 Per guardian, patient asks for these medications. Pt guardian feels that patient is having voices and has been having several behavior issues due to these voices.   [facility] discontinued Risperdal and Klonopin due to liver enezymes being elevated. Per guardian patient has been struggling more with behaviors since Risperdal and klonopin being discontinued.  [facility]4 to 1[facility]29 [facility].    Olga CoasterKristen Tranquilino Fischler, LCSW  Clinical Social Work  [facility] [phone]  01/02/2015 2:54 PM .

## 2015-01-02 NOTE — Consult Note (Signed)
[facility] Face-to-Face Psychiatry Consult   Reason for Consult: Auditory hallucination Referring Physician:  EDP Patient Identification: Tami [name] MRN:  [id number] Principal Diagnosis: Bipolar disorder Diagnosis:   Patient Active Problem List   Diagnosis Date Noted  . Bipolar disorder [F31.9] 09/04/2013    Priority: High  . Unspecified psychosis [F29] 09/11/2013  . Thought disorder [R29.90] 09/02/2013  . Marijuana dependence [F12.20] 09/02/2013  . Cocaine abuse [F14.10] 09/02/2013  . Tobacco dependence [F17.200] 09/02/2013    Total Time spent with patient: 45 minutes  Subjective:   Tami [name] is a 24 y.o. female patient admitted with Auditory Hallucination.Marland Kitchen  HPI: Caucasian female, 24 years old was evaluated for auditory hallucination hearing voices telling her to kill herself.   She came in involuntarily by her group home staff.  Patient was irritable and uncooperative and did not want to answer much questions.  She repeatedly asked providers why they had to ask"too many questions"  She has a hx of ADHD and Bipolar disorder and was last hospitalized here back in 2014.  Patient only remembers that she takes Adderall but could not remember the rest of her medications.  Patient lives at a group home named [address] of [place] and does not get along with a girl there.   She reported a previous hx of cutting herself and using drugs and Alcohol.  Patient denies drug use at this time and she denies HI/VH.   We are trying to get in touch with her group home staff and her guardian.  She has been accepted for admission and we are looking for bed at any hospital with available bed.  HPI Elements:   Location:  Bipolar disorder, ADHD by hx. Quality:  auditory hallucination, . Severity:  severe. Timing:  acute. Duration:  Chronic Mental illness.. Context:  seeking treatment.  Past Medical History:  Past Medical History  Diagnosis Date  . Seizures   . Bipolar disorder   . Chronic back pain    . ADHD (attention deficit hyperactivity disorder)     Past Surgical History  Procedure Laterality Date  . Throat surgery      throat surgery when younger doesn't know why   Family History: History reviewed. No pertinent family history. Social History:  History  Alcohol Use No     History  Drug Use  . Yes  . Special: Marijuana, Cocaine    History   Social History  . Marital Status: Single    Spouse Name: N/A  . Number of Children: N/A  . Years of Education: N/A   Social History Main Topics  . Smoking status: Current Every Day Smoker -- 1.00 packs/day for 4 years    Types: Cigarettes  . Smokeless tobacco: Not on file  . Alcohol Use: No  . Drug Use: Yes    Special: Marijuana, Cocaine  . Sexual Activity: Not Currently    Birth Control/ Protection: None   Other Topics Concern  . None   Social History Narrative   Additional Social History:    Pain Medications: SEE PTA Prescriptions: SEE PTA Over the Counter: SEE PTA History of alcohol / drug use?:  (UTA refused to answer, reported to RN [facility] and cocaine use in the past) Longest period of sobriety (when/how long): unknown Negative Consequences of Use:  (UTA) Withdrawal Symptoms:  (UTA)       Allergies:   Allergies  Allergen Reactions  . Benadryl [Diphenhydramine]     Itch   . Penicillins  Hives     Vitals: Blood pressure 105/59, pulse 100, temperature 98 F (36.7 C), temperature source Oral, resp. rate 16, SpO2 100 %.  Risk to Self: Suicidal Ideation: Yes-Currently Present Suicidal Intent:  (denies intent) Is patient at risk for suicide?: Yes Suicidal Plan?:  (pt refused to answer) Access to Means:  (unknown) What has been your use of drugs/alcohol within the last 12 months?: UTA How many times?: 1 Other Self Harm Risks: none Triggers for Past Attempts: Hallucinations Intentional Self Injurious Behavior: Cutting Comment - Self Injurious Behavior: reports "I used to do that" Risk to Others:  Homicidal Ideation: No Thoughts of Harm to Others: Yes-Currently Present Comment - Thoughts of Harm to Others: refused to provide details but denied planning or intent Current Homicidal Intent: No Current Homicidal Plan: No Access to Homicidal Means: No Identified Victim: unknown History of harm to others?: No Violent Behavior Description: denies Does patient have access to weapons?: No Criminal Charges Pending?: Yes Describe Pending Criminal Charges: refused to answer Does patient have a court date: Yes Court Date: 02/03/15 Prior Inpatient Therapy: Prior Inpatient Therapy: Yes Prior Therapy Dates: multiple Prior Therapy Facilty/Provider(s): [facility] in 2014 others unknown Reason for Treatment: psychosis Prior Outpatient Therapy: Prior Outpatient Therapy: Yes Prior Therapy Dates: current Prior Therapy Facilty/Provider(s): [facility]  Reason for Treatment: medication management   Current Facility-Administered Medications  Medication Dose Route Frequency Provider Last Rate Last Dose  . acetaminophen (TYLENOL) tablet 650 mg  650 mg Oral Q4H PRN Kaitlyn Szekalski, PA-C      . ibuprofen (ADVIL,MOTRIN) tablet 600 mg  600 mg Oral Q8H PRN Emilia Beck, PA-C      . LORazepam (ATIVAN) tablet 1 mg  1 mg Oral Q8H PRN Emilia Beck, PA-C   1 mg at 01/01/15 2111  . ondansetron (ZOFRAN) tablet 4 mg  4 mg Oral Q8H PRN Kaitlyn Szekalski, PA-C      . zolpidem (AMBIEN) tablet 5 mg  5 mg Oral QHS PRN Emilia Beck, PA-C       Current Outpatient Prescriptions  Medication Sig Dispense Refill  . benzonatate (TESSALON) 100 MG capsule Take 1 capsule (100 mg total) by mouth every 8 (eight) hours. (Patient not taking: Reported on 01/01/2015) 21 capsule 0  . guaiFENesin-codeine 100-10 MG/5ML syrup Take 5-10 mLs by mouth 3 (three) times daily as needed for cough. (Patient not taking: Reported on 09/13/2014) 120 mL 0  . naproxen (NAPROSYN) 500 MG tablet Take 1 tablet (500 mg total) by mouth 2 (two) times  daily. (Patient not taking: Reported on 09/13/2014) 30 tablet 0  . predniSONE (DELTASONE) 10 MG tablet Take 2 tablets (20 mg total) by mouth daily. (Patient not taking: Reported on 01/01/2015) 14 tablet 0  . traMADol (ULTRAM) 50 MG tablet Take 1 tablet (50 mg total) by mouth every 6 (six) hours as needed. (Patient not taking: Reported on 01/01/2015) 15 tablet 0  . traZODone (DESYREL) 50 MG tablet Take 1 tablet (50 mg total) by mouth at bedtime and may repeat dose one time if needed. (Patient not taking: Reported on 09/13/2014) 60 tablet 0    Musculoskeletal: Strength & Muscle Tone: within normal limits Gait & Station: normal Patient leans: N/A  Psychiatric Specialty Exam:     Blood pressure 105/59, pulse 100, temperature 98 F (36.7 C), temperature source Oral, resp. rate 16, SpO2 100 %.There is no weight on file to calculate BMI.  General Appearance: Casual and Disheveled  Eye Contact::  Poor  Speech:  Clear and Coherent  and Normal Rate  Volume:  Normal  Mood:  Angry, Anxious and Irritable  Affect:  Congruent and Labile  Thought Process:  Coherent and Loose  Orientation:  Full (Time, Place, and Person)  Thought Content:  Hallucinations: Auditory  Suicidal Thoughts:  No  Homicidal Thoughts:  No  Memory:  Immediate;   Fair Recent;   Fair Remote;   Fair  Judgement:  Impaired  Insight:  Shallow  Psychomotor Activity:  Normal  Concentration:  Fair  Recall:  NA  Fund of Knowledge:Fair  Language: Fair  Akathisia:  NA  Handed:  Right  AIMS (if indicated):     Assets:  Desire for Improvement  ADL's:  Intact  Cognition: WNL  Sleep:      Medical Decision Making: Established Problem, Worsening (2), Review of Medication Regimen & Side Effects (2) and Review of New Medication or Change in Dosage (2)  Treatment Plan Summary: Daily contact with patient to assess and evaluate symptoms and progress in treatment, Medication management and Plan Admit to inpatient Psychiatric hospital                            Plan:  Recommend psychiatric Inpatient admission when medically cleared. Admit to inpatient Disposition: Admit inpatient  Dahlia ByesONUOHA, Cendy Oconnor, Salena SanerC  PMHNP-BC 01/02/2015 11:55 AM

## 2015-01-02 NOTE — ED Notes (Signed)
Pt resting at present, no distress noted, will continue to monitor for safety. 
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
## 2015-01-03 DIAGNOSIS — F319 Bipolar disorder, unspecified: Secondary | ICD-10-CM | POA: Diagnosis not present

## 2015-01-03 DIAGNOSIS — F315 Bipolar disorder, current episode depressed, severe, with psychotic features: Secondary | ICD-10-CM | POA: Insufficient documentation

## 2015-01-03 MED ORDER — QUETIAPINE FUMARATE 50 MG PO TABS: 50.0000 mg | ORAL_TABLET | Freq: Every day | ORAL | Status: DC

## 2015-01-03 MED ORDER — HYDROXYZINE HCL 25 MG PO TABS: 25.0000 mg | ORAL_TABLET | Freq: Three times a day (TID) | ORAL | Status: DC | PRN

## 2015-01-03 MED ORDER — QUETIAPINE FUMARATE 25 MG PO TABS: 25.0000 mg | ORAL_TABLET | Freq: Two times a day (BID) | ORAL | Status: DC

## 2015-01-03 NOTE — Progress Notes (Signed)
Per psychiatrist and NP, patient psychiatrically stable for dc home. Pt to be [facility]. Haynes DageSandra Garner will be coming to pick up patient from the ED at approximately 1030am. Pt to be discharged with rx for seroquel per NP. CSW updated patient guardian, Earlie RavelingMichele Juchatz at [phone].   Olga CoasterKristen Cammie Faulstich, LCSW  Clinical Social Work  [facility] [phone]  01/03/2015 9:25 AM

## 2015-01-03 NOTE — Consult Note (Signed)
[facility] Face-to-Face Psychiatry Consult   Reason for Consult: Auditory hallucination Referring Physician:  EDP Patient Identification: Tami [name] ReasonCrystal Ishida MRN:  [id number] Principal Diagnosis: Bipolar disorder Diagnosis:   Patient Active Problem List   Diagnosis Date Noted  . Bipolar disorder [F31.9] 09/04/2013    Priority: High  . Auditory hallucination [R44.0]   . Unspecified psychosis [F29] 09/11/2013  . Thought disorder [R29.90] 09/02/2013  . Marijuana dependence [F12.20] 09/02/2013  . Cocaine abuse [F14.10] 09/02/2013  . Tobacco dependence [F17.200] 09/02/2013    Total Time spent with patient: 45 minutes  Subjective:   [name] is a 24 y.o. female patient admitted with Auditory Hallucination.Marland Kitchen.  HPI: Caucasian female, 24 years old was evaluated for auditory hallucination hearing voices telling her to kill herself.   She came in involuntarily by her group home staff.  Patient was irritable and uncooperative and did not want to answer much questions.  She repeatedly asked providers why they had to ask"too many questions"  She has a hx of ADHD and Bipolar disorder and was last hospitalized here back in 2014.  Patient only remembers that she takes Adderall but could not remember the rest of her medications.  [facility] and does not get along with a girl there.   She reported a previous hx of cutting herself and using drugs and Alcohol.  Patient denies drug use at this time and she denies HI/VH.   We are trying to get in touch with her group home staff and her guardian.  She has been accepted for admission and we are looking for bed at any hospital with available bed.  Updates:  Patient reported good sleep and appetite and she denies auditory hallucination.   She want to continue taking her Seroquel at the group home.  She was informed that Adderall is not the best medications for her. She denies SI/HI/AVH.  She will be discharged home now.  HPI  Elements:   Location:  Bipolar disorder, ADHD by hx. Quality:  auditory hallucination, . Severity:  severe. Timing:  acute. Duration:  Chronic Mental illness.. Context:  seeking treatment.  Past Medical History:  Past Medical History  Diagnosis Date  . Seizures   . Bipolar disorder   . Chronic back pain   . ADHD (attention deficit hyperactivity disorder)     Past Surgical History  Procedure Laterality Date  . Throat surgery      throat surgery when younger doesn't know why   Family History: History reviewed. No pertinent family history. Social History:  History  Alcohol Use No     History  Drug Use  . Yes  . Special: Marijuana, Cocaine    History   Social History  . Marital Status: Single    Spouse Name: N/A  . Number of Children: N/A  . Years of Education: N/A   Social History Main Topics  . Smoking status: Current Every Day Smoker -- 1.00 packs/day for 4 years    Types: Cigarettes  . Smokeless tobacco: Not on file  . Alcohol Use: No  . Drug Use: Yes    Special: Marijuana, Cocaine  . Sexual Activity: Not Currently    Birth Control/ Protection: None   Other Topics Concern  . None   Social History Narrative   Additional Social History:    Pain Medications: SEE PTA Prescriptions: SEE PTA Over the Counter: SEE PTA History of alcohol / drug use?:  (UTA refused to answer, reported  to RN [facility] and cocaine use in the past) Longest period of sobriety (when/how long): unknown Negative Consequences of Use:  (UTA) Withdrawal Symptoms:  (UTA)       Allergies:   Allergies  Allergen Reactions  . Benadryl [Diphenhydramine]     Itch   . Penicillins     Hives     Vitals: Blood pressure 106/58, pulse 69, temperature 98.1 F (36.7 C), temperature source Oral, resp. rate 20, SpO2 99 %.  Risk to Self: Suicidal Ideation: Yes-Currently Present Suicidal Intent:  (denies intent) Is patient at risk for suicide?: Yes Suicidal Plan?:  (pt refused to answer) Access  to Means:  (unknown) What has been your use of drugs/alcohol within the last 12 months?: UTA How many times?: 1 Other Self Harm Risks: none Triggers for Past Attempts: Hallucinations Intentional Self Injurious Behavior: Cutting Comment - Self Injurious Behavior: reports "I used to do that" Risk to Others: Homicidal Ideation: No Thoughts of Harm to Others: Yes-Currently Present Comment - Thoughts of Harm to Others: refused to provide details but denied planning or intent Current Homicidal Intent: No Current Homicidal Plan: No Access to Homicidal Means: No Identified Victim: unknown History of harm to others?: No Violent Behavior Description: denies Does patient have access to weapons?: No Criminal Charges Pending?: Yes Describe Pending Criminal Charges: refused to answer Does patient have a court date: Yes Court Date: 02/03/15 Prior Inpatient Therapy: Prior Inpatient Therapy: Yes Prior Therapy Dates: multiple Prior Therapy Facilty/Provider(s): [facility] in [facility] others unknown Reason for Treatment: psychosis Prior Outpatient Therapy: Prior Outpatient Therapy: Yes Prior Therapy Dates: current Prior Therapy Facilty/Provider(s): [facility]  Reason for Treatment: medication management   Current Facility-Administered Medications  Medication Dose Route Frequency Provider Last Rate Last Dose  . acetaminophen (TYLENOL) tablet 650 mg  650 mg Oral Q4H PRN Emilia Beck, PA-C      . atorvastatin (LIPITOR) tablet 10 mg  10 mg Oral q1800 Earney Navy, NP   10 mg at 01/02/15 1725  . FLUoxetine (PROZAC) capsule 10 mg  10 mg Oral Daily Earney Navy, NP   10 mg at 01/02/15 1607  . hydrOXYzine (ATARAX/VISTARIL) tablet 25 mg  25 mg Oral Q6H PRN Earney Navy, NP   25 mg at 01/02/15 1607  . ibuprofen (ADVIL,MOTRIN) tablet 600 mg  600 mg Oral Q8H PRN Emilia Beck, PA-C      . LORazepam (ATIVAN) tablet 1 mg  1 mg Oral Q8H PRN Emilia Beck, PA-C   1 mg at 01/02/15 1348  .  ondansetron (ZOFRAN) tablet 4 mg  4 mg Oral Q8H PRN Emilia Beck, PA-C      . propranolol (INDERAL) tablet 10 mg  10 mg Oral 3 times per day Earney Navy, NP   10 mg at 01/03/15 0608  . QUEtiapine (SEROQUEL) tablet 25 mg  25 mg Oral BID Earney Navy, NP   25 mg at 01/02/15 2105  . QUEtiapine (SEROQUEL) tablet 50 mg  50 mg Oral QHS Earney Navy, NP   50 mg at 01/02/15 2106  . zolpidem (AMBIEN) tablet 5 mg  5 mg Oral QHS PRN Emilia Beck, PA-C       Current Outpatient Prescriptions  Medication Sig Dispense Refill  . benzonatate (TESSALON) 100 MG capsule Take 1 capsule (100 mg total) by mouth every 8 (eight) hours. (Patient not taking: Reported on 01/01/2015) 21 capsule 0  . guaiFENesin-codeine 100-10 MG/5ML syrup Take 5-10 mLs by mouth 3 (three) times daily as needed  for cough. (Patient not taking: Reported on 09/13/2014) 120 mL 0  . naproxen (NAPROSYN) 500 MG tablet Take 1 tablet (500 mg total) by mouth 2 (two) times daily. (Patient not taking: Reported on 09/13/2014) 30 tablet 0  . predniSONE (DELTASONE) 10 MG tablet Take 2 tablets (20 mg total) by mouth daily. (Patient not taking: Reported on 01/01/2015) 14 tablet 0  . traMADol (ULTRAM) 50 MG tablet Take 1 tablet (50 mg total) by mouth every 6 (six) hours as needed. (Patient not taking: Reported on 01/01/2015) 15 tablet 0  . traZODone (DESYREL) 50 MG tablet Take 1 tablet (50 mg total) by mouth at bedtime and may repeat dose one time if needed. (Patient not taking: Reported on 09/13/2014) 60 tablet 0    Musculoskeletal: Strength & Muscle Tone: within normal limits Gait & Station: normal Patient leans: N/A  Psychiatric Specialty Exam:     Blood pressure 106/58, pulse 69, temperature 98.1 F (36.7 C), temperature source Oral, resp. rate 20, SpO2 99 %.There is no weight on file to calculate BMI.  General Appearance: Casual and Disheveled  Eye Contact::  Poor  Speech:  Clear and Coherent and Normal Rate  Volume:  Normal   Mood:  Angry, Anxious and Irritable  Affect:  Congruent and Labile  Thought Process:  Coherent and Loose  Orientation:  Full (Time, Place, and Person)  Thought Content:  WDL and Denies Hallucination today  Suicidal Thoughts:  No  Homicidal Thoughts:  No  Memory:  Immediate;   Fair Recent;   Fair Remote;   Fair  Judgement:  Impaired  Insight:  Shallow  Psychomotor Activity:  Normal  Concentration:  Fair  Recall:  NA  Fund of Knowledge:Fair  Language: Fair  Akathisia:  NA  Handed:  Right  AIMS (if indicated):     Assets:  Desire for Improvement  ADL's:  Intact  Cognition: WNL  Sleep:      Medical Decision Making: Established Problem, Stable/Improving (1)  Treatment Plan Summary: Daily contact with patient to assess and evaluate symptoms and progress in treatment, Medication management and Plan Admit to inpatient Psychiatric hospital                           Plan:  Discharge home to Group home Disposition: Discharge home today to her group home  Dahlia Byes, C  PMHNP-BC 01/03/2015 9:25 AM

## 2015-01-03 NOTE — ED Notes (Signed)
Patient did not respond to question concerning SI.

## 2015-01-03 NOTE — Progress Notes (Signed)
Pt plans to follow up with [facility] on 3/10 per group home provider.  Olga CoasterKristen Catalea Labrecque, LCSW  Clinical Social Work  [facility] [phone]

## 2015-01-17 ENCOUNTER — Inpatient Hospital Stay: Payer: Self-pay | Admitting: Psychiatry
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
## 2015-03-02 NOTE — H&P (Signed)
PATIENT NAME:  Tami Tami [name], Tami Tami [name] MR#:  [id number] DATE OF BIRTH:  Dec 12, 1990  DATE OF ADMISSION:  01/17/2015  IDENTIFYING INFORMATION: The patient is Tami [name] 24 year old female with Tami [name] history of chronic mental health problems and poor functioning who was brought to the emergency room due to auditory hallucinations and suicidal ideations.   CHIEF COMPLAINT: "I hear voices."   HISTORY OF PRESENT ILLNESS: The patient is Tami [name] 24 year old female who presented from the group home. The patient has been having auditory hallucinations. She did not participate in the interview and most of the history was obtained from the charts. She refused to come for the interview this morning. She was seen briefly in her room as she was lying in the bed and refused to talk to me. Most of the history was obtained through her records. According to the initial history, which was obtained by Dr. Toni Amendlapacs, the patient lives in [name] group home. She has been having auditory hallucinations and feels that the staff at the group home has been laughing at her. She has been having suicidal ideations with thoughts about killing herself. She also feels like she was going to explode and hurt somebody else. She is not sleeping well and has not been taking her medications on Tami [name] regular basis. She also has been using alcohol on an occasional basis. No new stress has been noted at this time.   PAST PSYCHIATRIC HISTORY: The patient has history of previous psychiatric hospitalization. Her previous diagnoses include schizoaffective disorder and PTSD. She remains noncompliant with her medication. At her last hospitalization, she was prescribed Prozac 10 mg only. [facility], but she did not keep her appointment.   PAST MEDICAL HISTORY: Hypercholesterolemia and congenital anomaly of short stature.  SUBSTANCE ABUSE HISTORY: The patient currently drinks alcohol.   SOCIAL HISTORY: She lives in [name] group home and she does not have any  social interaction.   CURRENT MEDICATIONS: Prozac 10 mg per day, atorvastatin 10 mg, hydroxyzine 50 mg at night, propranolol 10 mg 3 times daily, Strattera 40 mg daily.   ALLERGIES: BENADRYL AND PENICILLIN.   REVIEW OF SYSTEMS: The patient did not participate in the review of systems.   MENTAL STATUS EXAMINATION: The patient is Tami [name] short statured female who was lying in the bed. She (Dictation Anomaly) <<MISSING TEXT>> her eye contact. She did not participate in the interview. Her mood was depressed and affect was congruent. Thought process tangential. Thought content was delusional and having suicidal ideations. Her insight and judgment seems to be poor.   LABORATORY DATA: UDS positive for tricyclics. TSH normal. Salicylates negative. CBC unremarkable. Alcohol negative. Chemistry panel normal.   VITAL SIGNS: Blood pressure 120/86, respirations 18, pulse 94, current temperature 98.2.   DIAGNOSTIC FUNCTIONING:   AXIS I: Schizoaffective disorder, bipolar type.   AXIS II: None reported.   AXIS III: Dyslipidemia.   TREATMENT PLAN: 1. The patient will be admitted to the inpatient behavioral health unit for stabilization and safety.  2. She was started on hydroxyzine 50 mg at bedtime, propranolol 10 mg every 8 hours and atorvastatin 10 mg p.o. daily. She will be monitored closely by the staff.  3. Treatment team to adjust her medications.   Thank you for allowing me to participate in the care of this patient.    ____________________________ Ardeen FillersUzma S. Garnetta BuddyFaheem, MD usf:TT D: 01/18/2015 11:58:36 ET T: 01/18/2015 12:14:02 ET JOB#: [id number]  cc: Ardeen FillersUzma S. Garnetta BuddyFaheem, MD, <Dictator>
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
## 2015-03-02 NOTE — H&P (Signed)
PATIENT NAME:  Tami [name], Tami [name] MR#:  [id number] DATE OF BIRTH:  09-Nov-1990  DATE OF ADMISSION:  01/17/2015  IDENTIFYING INFORMATION: The patient is [name] [age] year old female with [name] history of chronic mental health problems and poor functioning who was brought to the emergency room due to auditory hallucinations and suicidal ideations.   CHIEF COMPLAINT: "I hear voices."   HISTORY OF PRESENT ILLNESS: The patient is [name] [age] year old female who presented from the group home. The patient has been having auditory hallucinations. She did not participate in the interview and most of the history was obtained from the charts. She refused to come for the interview this morning. She was seen briefly in her room as she was lying in the bed and refused to talk to me. Most of the history was obtained through her records. According to the initial history, which was obtained by Dr. Toni Amendlapacs, the patient lives in [name] group home. She has been having auditory hallucinations and feels that the staff at the group home has been laughing at her. She has been having suicidal ideations with thoughts about killing herself. She also feels like she was going to explode and hurt somebody else. She is not sleeping well and has not been taking her medications on [name] regular basis. She also has been using alcohol on an occasional basis. No new stress has been noted at this time.   PAST PSYCHIATRIC HISTORY: The patient has history of previous psychiatric hospitalization. Her previous diagnoses include schizoaffective disorder and PTSD. She remains noncompliant with her medication. At her last hospitalization, she was prescribed Prozac 10 mg only. She was supposed to follow up at [facility], but she did not keep her appointment.   PAST MEDICAL HISTORY: Hypercholesterolemia and congenital anomaly of short stature.  SUBSTANCE ABUSE HISTORY: The patient currently drinks alcohol.   SOCIAL HISTORY: She lives in [name] group home and she does not have any  social interaction.   CURRENT MEDICATIONS: Prozac 10 mg per day, atorvastatin 10 mg, hydroxyzine 50 mg at night, propranolol 10 mg 3 times daily, Strattera 40 mg daily.   ALLERGIES: BENADRYL AND PENICILLIN.   REVIEW OF SYSTEMS: The patient did not participate in the review of systems.   MENTAL STATUS EXAMINATION: The patient is [name] short statured female who was lying in the bed.  Her mood was depressed and affect was congruent. Thought process tangential. Thought content was delusional and having suicidal ideations. Her insight and judgment seems to be poor.   LABORATORY DATA: UDS positive for tricyclics. TSH normal. Salicylates negative. CBC unremarkable. Alcohol negative. Chemistry panel normal.   VITAL SIGNS: Blood pressure 120/86, respirations 18, pulse 94, current temperature 98.2.   DIAGNOSTIC FUNCTIONING:   AXIS I: Schizoaffective disorder, bipolar type.   AXIS II: None reported.   AXIS III: Dyslipidemia.   TREATMENT PLAN: 1. The patient will be admitted to the inpatient behavioral health unit for stabilization and safety.  2. She was started on hydroxyzine 50 mg at bedtime, propranolol 10 mg every 8 hours and atorvastatin 10 mg p.o. daily. She will be monitored closely by the staff.  3. Treatment team to adjust her medications.   Thank you for allowing me to participate in the care of this patient.   ____________________________ Ardeen FillersUzma S. Garnetta BuddyFaheem, MD usf:TT D: 01/18/2015 11:58:00 ET T: 01/18/2015 12:14:02 ET JOB#: [id number] Rhunette CroftUZMA S Neco Kling MD ELECTRONICALLY SIGNED 01/27/2015 12:33
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
## 2015-07-21 ENCOUNTER — Encounter: Payer: Self-pay | Admitting: Neurology

## 2015-07-21 ENCOUNTER — Ambulatory Visit (INDEPENDENT_AMBULATORY_CARE_PROVIDER_SITE_OTHER): Payer: Medicaid Other | Admitting: Neurology

## 2015-07-21 VITALS — BP 112/73 | HR 93 | Ht 59.0 in | Wt 144.0 lb

## 2015-07-21 DIAGNOSIS — R451 Restlessness and agitation: Secondary | ICD-10-CM | POA: Insufficient documentation

## 2015-07-21 DIAGNOSIS — R4182 Altered mental status, unspecified: Secondary | ICD-10-CM | POA: Diagnosis not present

## 2015-07-21 DIAGNOSIS — R4189 Other symptoms and signs involving cognitive functions and awareness: Secondary | ICD-10-CM

## 2015-07-21 DIAGNOSIS — R413 Other amnesia: Secondary | ICD-10-CM

## 2015-07-21 DIAGNOSIS — R41841 Cognitive communication deficit: Secondary | ICD-10-CM

## 2015-07-21 DIAGNOSIS — F05 Delirium due to known physiological condition: Secondary | ICD-10-CM

## 2015-07-21 DIAGNOSIS — R410 Disorientation, unspecified: Secondary | ICD-10-CM | POA: Insufficient documentation

## 2015-07-21 NOTE — Patient Instructions (Addendum)
Remember to drink plenty of fluid, eat healthy meals and do not skip any meals. Try to eat protein with a every meal and eat a healthy snack such as fruit or nuts in between meals. Try to keep a regular sleep-wake schedule and try to exercise daily, particularly in the form of walking, 20-30 minutes a day, if you can.   As far as diagnostic testing: MRi of the bran and eeg, labs  Our phone number is [phone]. We also have an after hours call service for urgent matters and there is a physician on-call for urgent questions. For any emergencies you know to call 911 or go to the nearest emergency room
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
## 2015-07-22 ENCOUNTER — Telehealth: Payer: Self-pay | Admitting: *Deleted

## 2015-07-22 LAB — RPR: RPR Ser Ql: NONREACTIVE

## 2015-07-22 LAB — HIV ANTIBODY (ROUTINE TESTING W REFLEX): HIV Screen 4th Generation wRfx: NONREACTIVE

## 2015-07-22 NOTE — Telephone Encounter (Signed)
LVM for pt caregiver to call back about lab results. Gave [facility] phone number and office hours. Advised Dr. Lucia Gaskins and I are not in the office on Friday's. Okay to inform her HIV and syphilis tests are negative.

## 2015-07-22 NOTE — Telephone Encounter (Signed)
-----   Message from Anson Fret, MD sent at 07/22/2015 10:16 AM EDT ----- Please let the patient's caregiver know that the HIV and syphilis tests were negative thanks

## 2015-07-22 NOTE — Telephone Encounter (Signed)
Caregiver Tami [name] returned your call. Per Tami [name], Caregiver advised HIV and Syphilis tests are negative.

## 2015-07-27 ENCOUNTER — Encounter: Payer: Self-pay | Admitting: Neurology

## 2015-08-11 ENCOUNTER — Ambulatory Visit (INDEPENDENT_AMBULATORY_CARE_PROVIDER_SITE_OTHER): Payer: Medicaid Other | Admitting: Neurology

## 2015-08-11 DIAGNOSIS — R4182 Altered mental status, unspecified: Secondary | ICD-10-CM | POA: Diagnosis not present

## 2015-08-11 DIAGNOSIS — R410 Disorientation, unspecified: Secondary | ICD-10-CM

## 2015-08-11 DIAGNOSIS — R413 Other amnesia: Secondary | ICD-10-CM

## 2015-08-11 DIAGNOSIS — R451 Restlessness and agitation: Secondary | ICD-10-CM

## 2015-08-11 DIAGNOSIS — F05 Delirium due to known physiological condition: Principal | ICD-10-CM

## 2015-08-11 NOTE — Procedures (Signed)
    History:  Tami [name] is a 24 year old patient with a history of bipolar disorder and schizoaffective disorder with persistent confusion and memory issues. The patient is being evaluated for this reason.  This is a routine EEG. No skull defects are noted. Medications include Depakote, Vistaril, Synthroid, Seroquel, and trazodone.   EEG classification: Normal awake  Description of the recording: The background rhythms of this recording consists of a fairly well modulated medium amplitude alpha rhythm of 9 Hz that is reactive to eye opening and closure. As the record progresses, the patient appears to remain in the waking state throughout the recording. Photic stimulation was performed, resulting in a bilateral and symmetric photic driving response. Hyperventilation was also performed, resulting in a minimal buildup of the background rhythm activities without significant slowing seen. At no time during the recording does there appear to be evidence of spike or spike wave discharges or evidence of focal slowing. EKG monitor shows no evidence of cardiac rhythm abnormalities with a heart rate of 60.  Impression: This is a normal EEG recording in the waking state. No evidence of ictal or interictal discharges are seen.
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
## 2016-02-12 IMAGING — CR DG CHEST 2V
2 series · 2 of 2 positions shown · non-contrast
Comparison: Chest radiograph 09/13/2014

CLINICAL DATA: Cough and congestion.

EXAM:
CHEST  2 VIEW

[w chest pa]
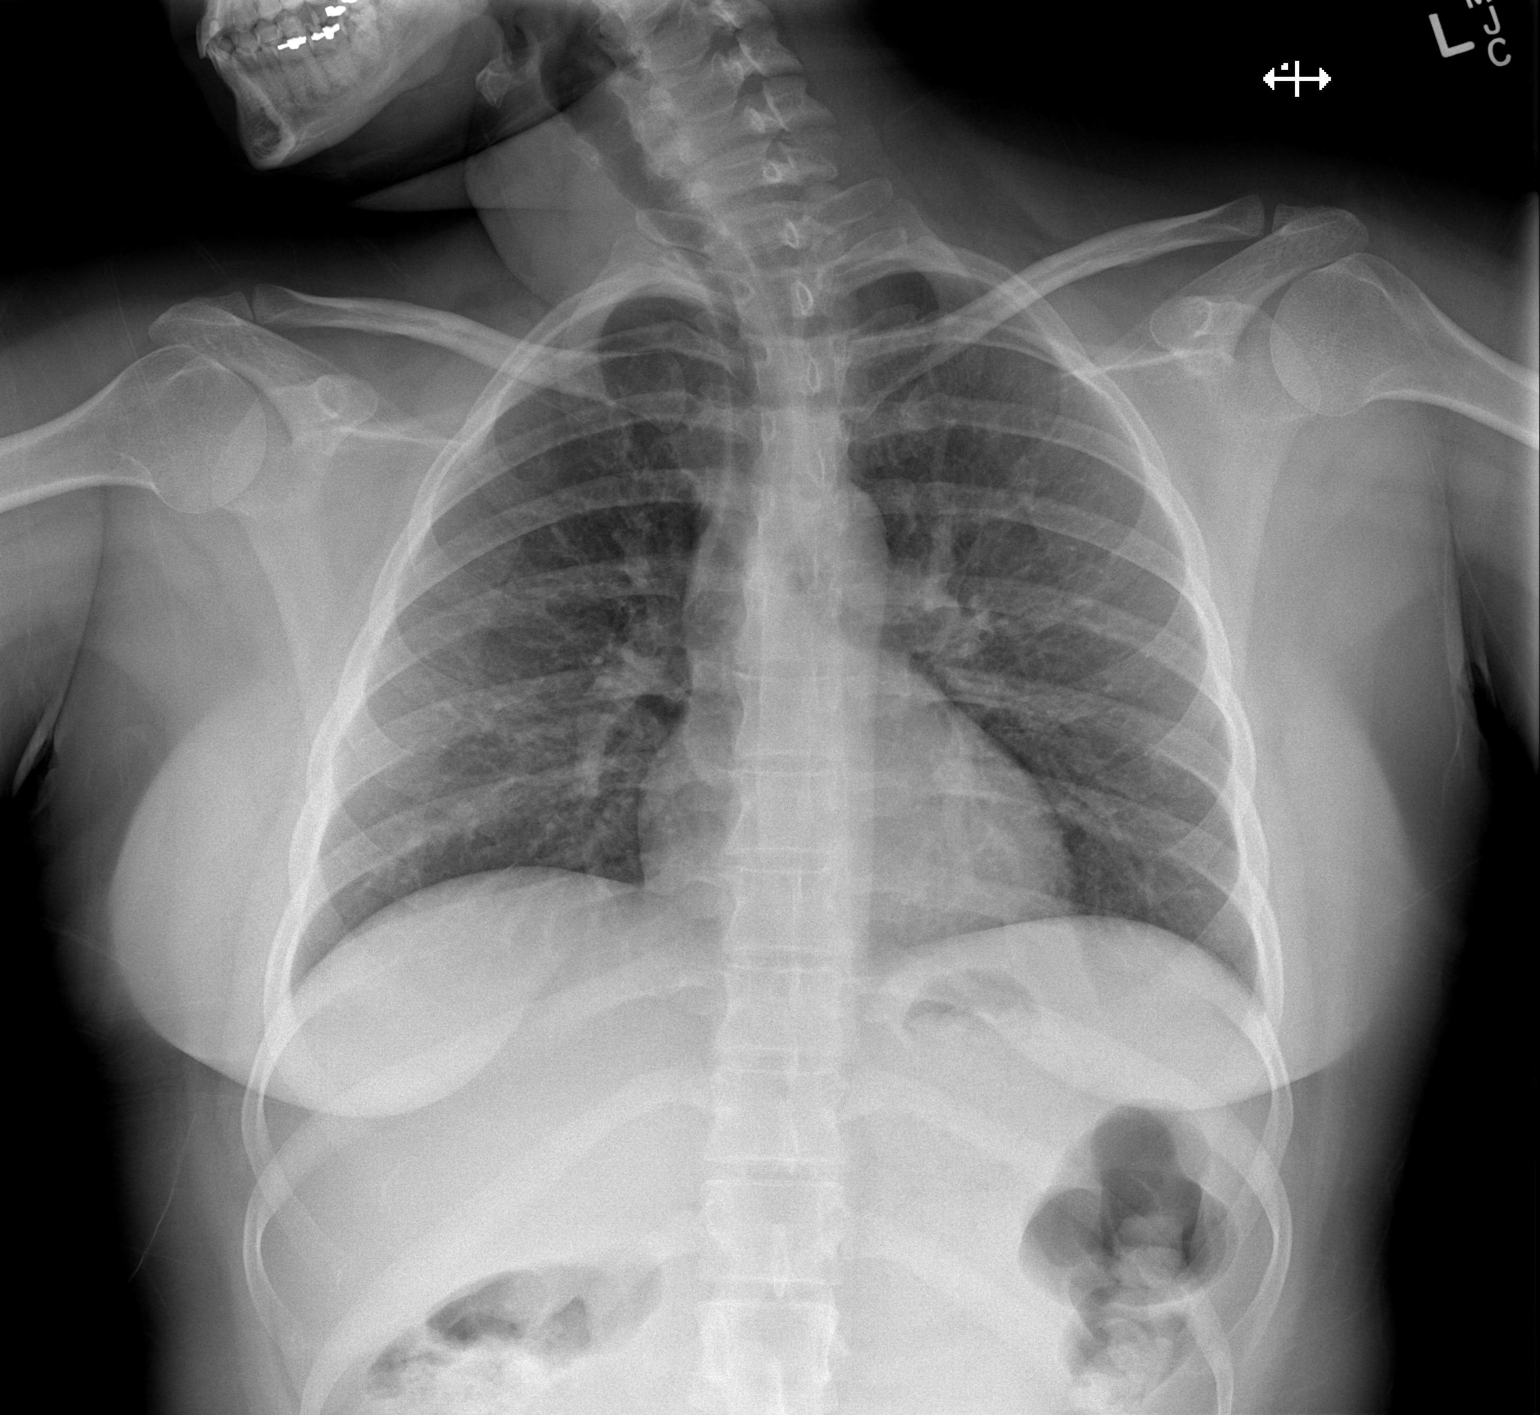

[w chest lat]
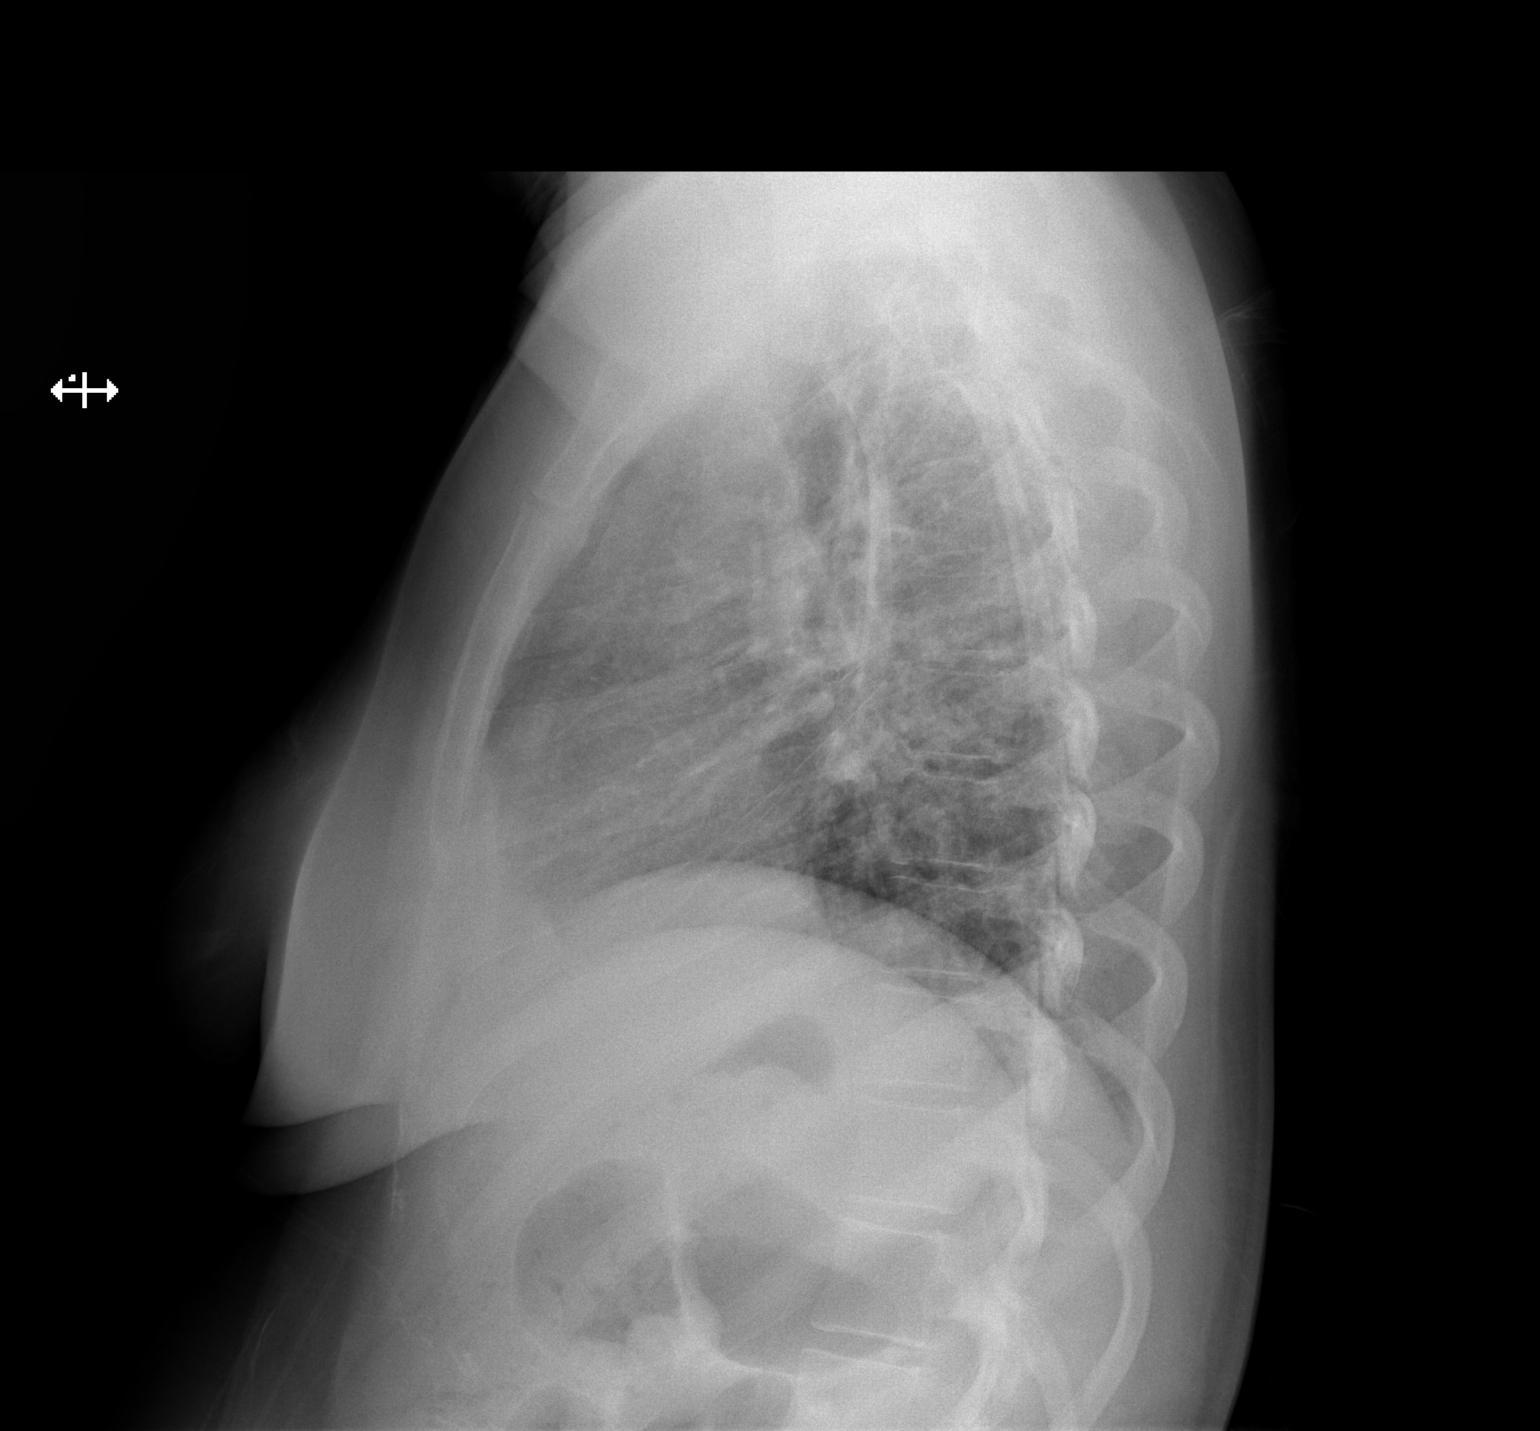

[2 of 2 positions shown; findings below may reference images not displayed]

FINDINGS: Stable cardiac and mediastinal contours. No consolidative pulmonary
opacities. No pleural effusion or pneumothorax. Regional skeleton is
unremarkable.
IMPRESSION: No acute cardiopulmonary process.

## 2017-03-09 ENCOUNTER — Encounter (HOSPITAL_COMMUNITY): Payer: Self-pay | Admitting: *Deleted

## 2017-03-09 ENCOUNTER — Emergency Department (HOSPITAL_COMMUNITY)
Admission: EM | Admit: 2017-03-09 | Discharge: 2017-03-10 | Disposition: A | Discharge status: Home / Self Care | Payer: Medicaid Other | Attending: Emergency Medicine | Admitting: Emergency Medicine

## 2017-03-09 DIAGNOSIS — Y999 Unspecified external cause status: Secondary | ICD-10-CM | POA: Insufficient documentation

## 2017-03-09 DIAGNOSIS — W500XXA Accidental hit or strike by another person, initial encounter: Secondary | ICD-10-CM | POA: Diagnosis not present

## 2017-03-09 DIAGNOSIS — F909 Attention-deficit hyperactivity disorder, unspecified type: Secondary | ICD-10-CM | POA: Diagnosis not present

## 2017-03-09 DIAGNOSIS — S0990XA Unspecified injury of head, initial encounter: Secondary | ICD-10-CM | POA: Diagnosis not present

## 2017-03-09 DIAGNOSIS — Y92210 Daycare center as the place of occurrence of the external cause: Secondary | ICD-10-CM | POA: Diagnosis not present

## 2017-03-09 DIAGNOSIS — Y939 Activity, unspecified: Secondary | ICD-10-CM | POA: Diagnosis not present

## 2017-03-09 DIAGNOSIS — F17210 Nicotine dependence, cigarettes, uncomplicated: Secondary | ICD-10-CM | POA: Diagnosis not present

## 2017-03-09 MED ORDER — IBUPROFEN 800 MG PO TABS: 800.0000 mg | ORAL_TABLET | Freq: Three times a day (TID) | ORAL | 0 refills | Status: DC | PRN

## 2017-03-09 MED ORDER — IBUPROFEN 800 MG PO TABS
800.0000 mg | ORAL_TABLET | Freq: Once | ORAL | Status: AC
  Administered 2017-03-09: 800 mg via ORAL
  Filled 2017-03-09: qty 1

## 2017-03-09 NOTE — ED Notes (Signed)
Due to pt baseline cognitive deficits this nurse attempted x 5 to contact her group home with the listed phone number as well as the phone number for group home listed online. No answer. Voicemail x 2 left on emergency contact voicemail. With no call back.

## 2017-03-09 NOTE — ED Triage Notes (Signed)
NO ANSWER WHEN CALLED 

## 2017-03-09 NOTE — Care Management (Signed)
Consulted concerning by Pod C RN  Concerning patient ready for discharge but unable to contact group home provider regarding her coming to transport patient back to group home. RN states that she has attempted to contact her several times. No emergent GP was contact for a safety check .

## 2017-03-09 NOTE — Discharge Instructions (Signed)
Return here as needed.  Follow up with a primary care doctor °
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
## 2017-03-10 MED ORDER — ACETAMINOPHEN 325 MG PO TABS
650.0000 mg | ORAL_TABLET | Freq: Once | ORAL | Status: AC
  Administered 2017-03-10: 650 mg via ORAL
  Filled 2017-03-10: qty 2

## 2017-03-10 NOTE — Progress Notes (Signed)
CSW has dispatched non-emergent [employer] back to group address as listed on Patient's facesheet. [employer] officer to give this CSW a phone call once at the location. CSW also contacted last known legal guardian for Patient, Earlie [place]avel[place]Michele [place] [place] [facility] [phone]) for further assistance w/ contacting Patient's group home. HIPAA compliant voice message left requesting return phone call. CSW continues to follow.    Enos FlingAshley Shante Maysonet, MSW, LCSW [facility] ED/13M Clinical Social Worker [phone]

## 2017-03-10 NOTE — ED Notes (Signed)
[facility] (group home)  arrived to pick pt up. Discharge instructions reviewed with pt and Ms. [name], both verbalized understanding. Pt walked out with Ms. [name].

## 2017-03-10 NOTE — ED Notes (Signed)
Pt ambulated to bathroom without difficulty.

## 2017-03-10 NOTE — ED Notes (Addendum)
Called group home again and left message for [facility] in regards to pt being picked up from ER. Left message with number for her to call back immediately.

## 2017-03-10 NOTE — ED Notes (Signed)
Pt ambulating around in room. RN rounding on pt and pt was looking at wipes. Wipes removed from room and pt educated that wipes are used for cleaning.

## 2017-03-10 NOTE — ED Notes (Signed)
Spoke with CM about transportation for pt to return to group home.
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
## 2017-03-10 NOTE — ED Notes (Signed)
Call placed to Emergency contact and voicemail left for pt pick up

## 2017-03-10 NOTE — ED Notes (Signed)
Called group home and left a message for Tacy Learn[facility]ion Wallace to return my call within the next 45 minutes to arrange pick up of pt.

## 2017-03-10 NOTE — ED Notes (Signed)
Pt screaming and tearful. Pt c/o her nose hurting. New orders received. Pt asked multiple times to stop yelling. [employer] and security outside room. Pt agreeable to take tylenol.

## 2017-07-27 ENCOUNTER — Emergency Department (HOSPITAL_COMMUNITY)
Admission: EM | Admit: 2017-07-27 | Discharge: 2017-07-28 | Disposition: A | Discharge status: Home / Self Care | Payer: Medicaid Other | Attending: Emergency Medicine | Admitting: Emergency Medicine

## 2017-07-27 ENCOUNTER — Encounter (HOSPITAL_COMMUNITY): Payer: Self-pay | Admitting: *Deleted

## 2017-07-27 DIAGNOSIS — F909 Attention-deficit hyperactivity disorder, unspecified type: Secondary | ICD-10-CM | POA: Insufficient documentation

## 2017-07-27 DIAGNOSIS — Z79899 Other long term (current) drug therapy: Secondary | ICD-10-CM | POA: Insufficient documentation

## 2017-07-27 DIAGNOSIS — F3161 Bipolar disorder, current episode mixed, mild: Secondary | ICD-10-CM | POA: Insufficient documentation

## 2017-07-27 DIAGNOSIS — F17210 Nicotine dependence, cigarettes, uncomplicated: Secondary | ICD-10-CM | POA: Diagnosis not present

## 2017-07-27 DIAGNOSIS — F329 Major depressive disorder, single episode, unspecified: Secondary | ICD-10-CM | POA: Diagnosis present

## 2017-07-27 DIAGNOSIS — F3160 Bipolar disorder, current episode mixed, unspecified: Secondary | ICD-10-CM | POA: Diagnosis present

## 2017-07-27 LAB — CBC WITH DIFFERENTIAL/PLATELET
Basophils Absolute: 0 10*3/uL (ref 0.0–0.1)
Basophils Relative: 0 %
Eosinophils Absolute: 0.2 10*3/uL (ref 0.0–0.7)
Eosinophils Relative: 2 %
HCT: 36.5 % (ref 36.0–46.0)
Hemoglobin: 12.1 g/dL (ref 12.0–15.0)
Lymphocytes Relative: 38 %
Lymphs Abs: 3.4 10*3/uL (ref 0.7–4.0)
MCH: 26.9 pg (ref 26.0–34.0)
MCHC: 33.2 g/dL (ref 30.0–36.0)
MCV: 81.1 fL (ref 78.0–100.0)
Monocytes Absolute: 0.7 10*3/uL (ref 0.1–1.0)
Monocytes Relative: 7 %
Neutro Abs: 4.6 10*3/uL (ref 1.7–7.7)
Neutrophils Relative %: 53 %
Platelets: 258 10*3/uL (ref 150–400)
RBC: 4.5 MIL/uL (ref 3.87–5.11)
RDW: 13.2 % (ref 11.5–15.5)
WBC: 8.9 10*3/uL (ref 4.0–10.5)

## 2017-07-27 LAB — COMPREHENSIVE METABOLIC PANEL
ALT: 18 U/L (ref 14–54)
AST: 20 U/L (ref 15–41)
Albumin: 3.4 g/dL — ABNORMAL LOW (ref 3.5–5.0)
Alkaline Phosphatase: 92 U/L (ref 38–126)
Anion gap: 8 (ref 5–15)
BUN: 10 mg/dL (ref 6–20)
CO2: 23 mmol/L (ref 22–32)
Calcium: 8.6 mg/dL — ABNORMAL LOW (ref 8.9–10.3)
Chloride: 106 mmol/L (ref 101–111)
Creatinine, Ser: 0.57 mg/dL (ref 0.44–1.00)
GFR calc Af Amer: >60 mL/min (ref >60)
GFR calc non Af Amer: >60 mL/min (ref >60)
Glucose, Bld: 101 mg/dL — ABNORMAL HIGH (ref 65–99)
Potassium: 3.9 mmol/L (ref 3.5–5.1)
Sodium: 137 mmol/L (ref 135–145)
Total Bilirubin: 0.3 mg/dL (ref 0.3–1.2)
Total Protein: 6.9 g/dL (ref 6.5–8.1)

## 2017-07-27 LAB — RAPID URINE DRUG SCREEN, HOSP PERFORMED
Amphetamines: NOT DETECTED
Barbiturates: NOT DETECTED
Benzodiazepines: NOT DETECTED
Cocaine: NOT DETECTED
Opiates: NOT DETECTED
Tetrahydrocannabinol: NOT DETECTED

## 2017-07-27 LAB — ETHANOL: Alcohol, Ethyl (B): <10 mg/dL (ref <10)

## 2017-07-27 LAB — ACETAMINOPHEN LEVEL: Acetaminophen (Tylenol), Serum: <10 ug/mL — ABNORMAL LOW (ref 10–30)

## 2017-07-27 LAB — HCG, QUANTITATIVE, PREGNANCY: hCG, Beta Chain, Quant, S: <1 m[IU]/mL (ref <5)

## 2017-07-27 LAB — SALICYLATE LEVEL: Salicylate Lvl: <7 mg/dL (ref 2.8–30.0)

## 2017-07-27 MED ORDER — TRAZODONE HCL 50 MG PO TABS: 50.0000 mg | ORAL_TABLET | Freq: Every evening | ORAL | Status: DC | PRN

## 2017-07-27 MED ORDER — QUETIAPINE FUMARATE 50 MG PO TABS
50.0000 mg | ORAL_TABLET | Freq: Every day | ORAL | Status: DC
Start: 2017-07-28 — End: 2017-07-28
  Administered 2017-07-28: 50 mg via ORAL
  Filled 2017-07-27: qty 1

## 2017-07-27 MED ORDER — DIVALPROEX SODIUM ER 250 MG PO TB24
250.0000 mg | ORAL_TABLET | Freq: Every day | ORAL | Status: DC
  Administered 2017-07-28: 250 mg via ORAL
  Filled 2017-07-27: qty 1

## 2017-07-27 MED ORDER — LEVOTHYROXINE SODIUM 25 MCG PO TABS
25.0000 ug | ORAL_TABLET | Freq: Every day | ORAL | Status: DC
  Administered 2017-07-28: 25 ug via ORAL
  Filled 2017-07-27 (×2): qty 1

## 2017-07-27 MED ORDER — QUETIAPINE FUMARATE 25 MG PO TABS
25.0000 mg | ORAL_TABLET | Freq: Two times a day (BID) | ORAL | Status: DC
  Administered 2017-07-28 (×2): 25 mg via ORAL
  Filled 2017-07-27 (×2): qty 1
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
## 2017-07-28 DIAGNOSIS — F3160 Bipolar disorder, current episode mixed, unspecified: Secondary | ICD-10-CM | POA: Diagnosis present

## 2017-07-28 MED ORDER — ONDANSETRON HCL 4 MG PO TABS: 4.0000 mg | ORAL_TABLET | Freq: Three times a day (TID) | ORAL | Status: DC | PRN

## 2017-07-28 MED ORDER — ACETAMINOPHEN 325 MG PO TABS
650.0000 mg | ORAL_TABLET | ORAL | Status: DC | PRN
  Administered 2017-07-28: 650 mg via ORAL
  Filled 2017-07-28: qty 2

## 2017-07-28 MED ORDER — NICOTINE 7 MG/24HR TD PT24: 7.0000 mg | MEDICATED_PATCH | Freq: Every day | TRANSDERMAL | Status: DC

## 2017-07-28 MED ORDER — ZOLPIDEM TARTRATE 5 MG PO TABS: 5.0000 mg | ORAL_TABLET | Freq: Every evening | ORAL | Status: DC | PRN

## 2017-07-28 MED ORDER — ALUM & MAG HYDROXIDE-SIMETH 200-200-20 MG/5ML PO SUSP: 30.0000 mL | Freq: Four times a day (QID) | ORAL | Status: DC | PRN

## 2017-07-28 NOTE — Progress Notes (Addendum)
Update 3PM: CSW spoke with legal guardian, Ernestina Penna, who stated patient is still a resident at [facility]. Guardian would be in meeting until after 5PM therefore may be difficult to reach. At this time, group home is respossible to pick patient up however CSW has not yet received return call. CSW will notify PM social worker to continue to follow up.    Per notes from 03/10/2017, patient was living at [facility] [phone]) owner is Elizebeth Brooking [phone]). CSW attempted to contact both numbers however no answer. CSW left voicemail for return call.   Per notes, patient has legal guardian Ernestina Penna with [facility] at [phone]/ [phone]. CSW will attempt to contact legal guardian.   Stacy Gardner, [facility] Emergency Room Clinical Social Worker [phone]
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
## 2017-07-28 NOTE — ED Notes (Signed)
Pharm tech unable to confirm seroquel dose.

## 2017-07-28 NOTE — BHH Counselor (Signed)
Disposition discussed with Consuella Lose, RN.    Redmond Pulling, MS, [facility], [facility] Triage Specialist [phone]
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
## 2017-07-28 NOTE — Progress Notes (Addendum)
CSW called pt's DSS legal guardian Larae Grooms and informed her the pt's group home is not answering phone calls or VM's that pt is ready for D/C.  Per, Miss [place], group home is respossible to pick patient up however CSW has not yet received return call and thus, Miss Stinsonin calling the group home now to facilitate group home picking the pt up.  CSW will continue to follow for D/C needs.    Please reconsult if future social work needs arise.    5:28 PM CSW spoke to pt's LG who stated group home manager stated [facility] manager is "not feeling well" and that then [facility] manager "can't pick her up" and asked that [facility] ED send pt by taxi and that the [facility] manager will pay for it.  CSW staffed this with RN who stated this was unsafe due to pt having "the mentality of a 75-47 year old".  CSW called pt's LG back stating pt's is D/C'd and that [facility] manager WILL have to pick pt up to LG.  LG stated she will call [facility] manager and insist [facility] manager pick pt up tonight (9/27)/  CSW will continue to follow for D/C needs.  5:33 PM CSW received call from pt's LG Miss Adrian Blackwater who stated that DSS social worker Lianne Moris will pick pt up very shortly.  7:26 PM CSW called pt's CSW called pt's DSS legal guardian Larae Grooms and informed her pt had not been picked up by her DSS associate Lianne Moris and asked for an ETA.  CSW awaiting return call from Miss [place] at ph: [phone].   Dorothe Pea. Keshauna Degraffenreid, LCSW, LCAS, CSI Clinical Social Worker Ph: [phone]

## 2018-01-31 ENCOUNTER — Ambulatory Visit: Payer: Self-pay | Admitting: Obstetrics and Gynecology

## 2018-07-18 ENCOUNTER — Other Ambulatory Visit: Payer: Self-pay

## 2018-07-18 ENCOUNTER — Emergency Department (HOSPITAL_COMMUNITY): Payer: Medicaid Other

## 2018-07-18 ENCOUNTER — Encounter (HOSPITAL_COMMUNITY): Payer: Self-pay | Admitting: Emergency Medicine

## 2018-07-18 ENCOUNTER — Emergency Department (HOSPITAL_COMMUNITY)
Admission: EM | Admit: 2018-07-18 | Discharge: 2018-07-19 | Disposition: A | Discharge status: Home / Self Care | Payer: Medicaid Other | Attending: Emergency Medicine | Admitting: Emergency Medicine

## 2018-07-18 DIAGNOSIS — Z79899 Other long term (current) drug therapy: Secondary | ICD-10-CM | POA: Diagnosis not present

## 2018-07-18 DIAGNOSIS — Y939 Activity, unspecified: Secondary | ICD-10-CM | POA: Diagnosis not present

## 2018-07-18 DIAGNOSIS — F17210 Nicotine dependence, cigarettes, uncomplicated: Secondary | ICD-10-CM | POA: Insufficient documentation

## 2018-07-18 DIAGNOSIS — S39012A Strain of muscle, fascia and tendon of lower back, initial encounter: Secondary | ICD-10-CM | POA: Diagnosis not present

## 2018-07-18 DIAGNOSIS — Y92410 Unspecified street and highway as the place of occurrence of the external cause: Secondary | ICD-10-CM | POA: Insufficient documentation

## 2018-07-18 DIAGNOSIS — S161XXA Strain of muscle, fascia and tendon at neck level, initial encounter: Secondary | ICD-10-CM | POA: Diagnosis not present

## 2018-07-18 DIAGNOSIS — S199XXA Unspecified injury of neck, initial encounter: Secondary | ICD-10-CM | POA: Diagnosis present

## 2018-07-18 DIAGNOSIS — Y999 Unspecified external cause status: Secondary | ICD-10-CM | POA: Insufficient documentation

## 2018-07-18 LAB — I-STAT BETA HCG BLOOD, ED (MC, WL, AP ONLY): I-stat hCG, quantitative: <5 m[IU]/mL (ref <5)

## 2018-07-18 NOTE — Discharge Instructions (Signed)
Warm compresses to sore muscles for 20 minutes at a time. Take Motrin and Tylenol as needed as directed for pain. Check with your doctor in 1 week.
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
## 2019-10-25 ENCOUNTER — Inpatient Hospital Stay (HOSPITAL_COMMUNITY)
Admission: RE | Admit: 2019-10-25 | Discharge: 2019-10-30 | DRG: 885 | Disposition: A | Discharge status: Home / Self Care | Payer: Medicaid Other | Attending: Psychiatry | Admitting: Psychiatry

## 2019-10-25 ENCOUNTER — Encounter (HOSPITAL_COMMUNITY): Payer: Self-pay | Admitting: Psychiatric/Mental Health

## 2019-10-25 ENCOUNTER — Other Ambulatory Visit: Payer: Self-pay

## 2019-10-25 DIAGNOSIS — E039 Hypothyroidism, unspecified: Secondary | ICD-10-CM | POA: Diagnosis present

## 2019-10-25 DIAGNOSIS — F603 Borderline personality disorder: Secondary | ICD-10-CM | POA: Diagnosis present

## 2019-10-25 DIAGNOSIS — R4183 Borderline intellectual functioning: Secondary | ICD-10-CM | POA: Diagnosis present

## 2019-10-25 DIAGNOSIS — F3160 Bipolar disorder, current episode mixed, unspecified: Secondary | ICD-10-CM | POA: Diagnosis not present

## 2019-10-25 DIAGNOSIS — F259 Schizoaffective disorder, unspecified: Secondary | ICD-10-CM | POA: Diagnosis present

## 2019-10-25 DIAGNOSIS — Z79899 Other long term (current) drug therapy: Secondary | ICD-10-CM

## 2019-10-25 DIAGNOSIS — Z880 Allergy status to penicillin: Secondary | ICD-10-CM

## 2019-10-25 DIAGNOSIS — F909 Attention-deficit hyperactivity disorder, unspecified type: Secondary | ICD-10-CM | POA: Diagnosis not present

## 2019-10-25 DIAGNOSIS — R45851 Suicidal ideations: Secondary | ICD-10-CM | POA: Diagnosis not present

## 2019-10-25 DIAGNOSIS — G8929 Other chronic pain: Secondary | ICD-10-CM | POA: Diagnosis not present

## 2019-10-25 DIAGNOSIS — Z20828 Contact with and (suspected) exposure to other viral communicable diseases: Secondary | ICD-10-CM | POA: Diagnosis present

## 2019-10-25 DIAGNOSIS — Z820 Family history of epilepsy and other diseases of the nervous system: Secondary | ICD-10-CM | POA: Diagnosis not present

## 2019-10-25 DIAGNOSIS — F4310 Post-traumatic stress disorder, unspecified: Secondary | ICD-10-CM | POA: Diagnosis present

## 2019-10-25 DIAGNOSIS — G4700 Insomnia, unspecified: Secondary | ICD-10-CM | POA: Diagnosis present

## 2019-10-25 DIAGNOSIS — R079 Chest pain, unspecified: Secondary | ICD-10-CM | POA: Diagnosis present

## 2019-10-25 DIAGNOSIS — F17210 Nicotine dependence, cigarettes, uncomplicated: Secondary | ICD-10-CM | POA: Diagnosis not present

## 2019-10-25 DIAGNOSIS — Z888 Allergy status to other drugs, medicaments and biological substances status: Secondary | ICD-10-CM

## 2019-10-25 DIAGNOSIS — M549 Dorsalgia, unspecified: Secondary | ICD-10-CM | POA: Diagnosis present

## 2019-10-25 DIAGNOSIS — R45850 Homicidal ideations: Secondary | ICD-10-CM | POA: Diagnosis not present

## 2019-10-25 DIAGNOSIS — Z79890 Hormone replacement therapy (postmenopausal): Secondary | ICD-10-CM

## 2019-10-25 DIAGNOSIS — F410 Panic disorder [episodic paroxysmal anxiety] without agoraphobia: Secondary | ICD-10-CM | POA: Diagnosis present

## 2019-10-25 DIAGNOSIS — R451 Restlessness and agitation: Secondary | ICD-10-CM | POA: Diagnosis present

## 2019-10-25 DIAGNOSIS — F329 Major depressive disorder, single episode, unspecified: Secondary | ICD-10-CM | POA: Diagnosis present

## 2019-10-25 DIAGNOSIS — F322 Major depressive disorder, single episode, severe without psychotic features: Secondary | ICD-10-CM

## 2019-10-25 DIAGNOSIS — E781 Pure hyperglyceridemia: Secondary | ICD-10-CM | POA: Diagnosis present

## 2019-10-25 LAB — RESPIRATORY PANEL BY RT PCR (FLU A&B, COVID)
Influenza A by PCR: NEGATIVE
Influenza B by PCR: NEGATIVE
SARS Coronavirus 2 by RT PCR: NEGATIVE

## 2019-10-25 MED ORDER — ACETAMINOPHEN 325 MG PO TABS
650.0000 mg | ORAL_TABLET | Freq: Four times a day (QID) | ORAL | Status: DC | PRN
Start: 2019-10-25 — End: 2019-10-30
  Administered 2019-10-26 – 2019-10-29 (×5): 650 mg via ORAL
  Filled 2019-10-25 (×5): qty 2

## 2019-10-25 MED ORDER — ALUM & MAG HYDROXIDE-SIMETH 200-200-20 MG/5ML PO SUSP
30.0000 mL | ORAL | Status: DC | PRN
  Administered 2019-10-30: 30 mL via ORAL

## 2019-10-25 MED ORDER — HYDROXYZINE HCL 25 MG PO TABS
25.0000 mg | ORAL_TABLET | Freq: Three times a day (TID) | ORAL | Status: DC | PRN
  Administered 2019-10-25 – 2019-10-29 (×4): 25 mg via ORAL
  Filled 2019-10-25 (×4): qty 1

## 2019-10-25 MED ORDER — QUETIAPINE FUMARATE 25 MG PO TABS
25.0000 mg | ORAL_TABLET | Freq: Once | ORAL | Status: AC
  Administered 2019-10-26: 25 mg via ORAL
  Filled 2019-10-25 (×2): qty 1

## 2019-10-25 MED ORDER — DIVALPROEX SODIUM 500 MG PO DR TAB
500.0000 mg | DELAYED_RELEASE_TABLET | Freq: Two times a day (BID) | ORAL | Status: DC
  Administered 2019-10-26 – 2019-10-30 (×10): 500 mg via ORAL
  Filled 2019-10-25 (×16): qty 1

## 2019-10-25 MED ORDER — DONEPEZIL HCL 5 MG PO TABS
5.0000 mg | ORAL_TABLET | Freq: Every day | ORAL | Status: DC
  Administered 2019-10-26: 5 mg via ORAL
  Filled 2019-10-25 (×3): qty 1

## 2019-10-25 MED ORDER — TRAZODONE HCL 100 MG PO TABS
100.0000 mg | ORAL_TABLET | Freq: Every evening | ORAL | Status: DC | PRN
  Administered 2019-10-25: 100 mg via ORAL
  Filled 2019-10-25: qty 1

## 2019-10-25 MED ORDER — MAGNESIUM HYDROXIDE 400 MG/5ML PO SUSP
30.0000 mL | Freq: Every day | ORAL | Status: DC | PRN
  Administered 2019-10-27: 30 mL via ORAL

## 2019-10-25 MED ORDER — LEVOTHYROXINE SODIUM 25 MCG PO TABS
25.0000 ug | ORAL_TABLET | Freq: Every day | ORAL | Status: DC
  Administered 2019-10-26 – 2019-10-30 (×5): 25 ug via ORAL
  Filled 2019-10-25 (×8): qty 1
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
## 2019-10-25 NOTE — Tx Team (Signed)
Initial Treatment Plan 10/25/2019 10:15 PM [place] WCB:[id number]    PATIENT STRESSORS: Loss of Mother Traumatic event Other: Conflict with staff at her group home   PATIENT STRENGTHS: Communication skills Motivation for treatment/growth   PATIENT IDENTIFIED PROBLEMS: Suicidal Ideation  Homicidal Ideation  Depression       "work on self control"  "learn how to calm down"         DISCHARGE CRITERIA:  Adequate post-discharge living arrangements Improved stabilization in mood, thinking, and/or behavior Motivation to continue treatment in a less acute level of care Need for constant or close observation no longer present Verbal commitment to aftercare and medication compliance  PRELIMINARY DISCHARGE PLAN: Attend aftercare/continuing care group Outpatient therapy  PATIENT/FAMILY INVOLVEMENT: This treatment plan has been presented to and reviewed with the patient, [employer].  The patient and family have been given the opportunity to ask questions and make suggestions.  Margaretann Loveless, RN 10/25/2019, 10:15 PM
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
## 2019-10-25 NOTE — H&P (Signed)
Psychiatric Admission Assessment Adult  Patient Identification: Tami [name] MRN:  [id number] Date of Evaluation:  10/25/2019 Chief Complaint:  pending Principal Diagnosis: Homicidal thoughts Diagnosis:  Principal Problem:   Homicidal thoughts Active Problems:   Agitation   Bipolar affective disorder, current episode mixed (HCC)  History of Present Illness: Tami [name], 28 y.o., female patient seen face to face by this provider; and chart reviewed. On evaluation Tami [name] reports that she is really upset with the staff member at the group home where she resides.  She say she "had enough".  Pt stated that she is tired of being punished. She stated that the staff member wont let her eat, wash her hair or do anything and this has been going on for a few days.  She said that she and the staff member got into an argument and the staff member to her to leave the home.  Pt states she has a lot of stressors such as her mom recently passed away and she says her aunt let that happen.  She also states that her boyfriend died bleeding in her arms and that she has nightmares about that.  The stress of her trauma and the stress of the group home has "her running on fumes".  Pt stated that "if I go back, I promise you something is going to happen".    During evaluation Tami [name] is sittinf; she is alert/oriented x 4; anxious/depressed; and mood congruent with affect.  Patient is speaking in a clear tone at moderate volume, and normal pace; with good eye contact.  Her thought process is coherent and relevant; There is no indication that she is currently responding to internal/external stimuli or experiencing delusional thought content.  Patient denies suicidal/self-harm but endorses homicidal ideation towards aunt and group home staff member, Elmarie Shileyiffany.  She is not experiencing , psychosis, or paranoia.  Patient has remained tearful and anxious throughout assessment and has answered  questions appropriately.   Recommendation: Recommend Inpatient hospitalization for therapy and stabilization.  Associated Signs/Symptoms: Depression Symptoms:  depressed mood, hopelessness, anxiety, panic attacks, (Hypo) Manic Symptoms:  Impulsivity, Irritable Mood, Anxiety Symptoms:  Excessive Worry, Panic Symptoms, Psychotic Symptoms:  Hallucinations: Auditory PTSD Symptoms: Had a traumatic exposure:  Boyfriend died bleeding in her arms Total Time spent with patient: 45 minutes  Past Psychiatric History: Yes   Is the patient at risk to self? No.  Has the patient been a risk to self in the past 6 months? No.  Has the patient been a risk to self within the distant past? No.  Is the patient a risk to others? Yes.    Has the patient been a risk to others in the past 6 months? Yes.    Has the patient been a risk to others within the distant past? Yes.     Prior Inpatient Therapy:  yes Prior Outpatient Therapy:  yes  Alcohol Screening:   Substance Abuse History in the last 12 months:  Yes.   Consequences of Substance Abuse: NA Previous Psychotropic Medications: Yes  Psychological Evaluations: Yes  Past Medical History:  Past Medical History:  Diagnosis Date  . ADHD (attention deficit hyperactivity disorder)   . Bipolar disorder (HCC)   . Chronic back pain   . Seizures (HCC)     Past Surgical History:  Procedure Laterality Date  . THROAT SURGERY     throat surgery when younger doesn't know why   Family History:  Family History  Problem Relation  Age of Onset  . Dementia Neg Hx    Family Psychiatric  History: yes Tobacco Screening:   Social History:  Social History   Substance and Sexual Activity  Alcohol Use No  . Alcohol/week: 0.0 standard drinks     Social History   Substance and Sexual Activity  Drug Use No    Additional Social History:                           Allergies:   Allergies  Allergen Reactions  . Benadryl [Diphenhydramine]      Itch   . Penicillins     Hives    Lab Results: No results found for this or any previous visit (from the past 48 hour(s)).  Blood Alcohol level:  Lab Results  Component Value Date   ETH <10 07/27/2017   ETH <5 58/59/2924    Metabolic Disorder Labs:  Lab Results  Component Value Date   HGBA1C 5.1 11/15/2014   No results found for: PROLACTIN Lab Results  Component Value Date   CHOL 114 11/15/2014   TRIG 77 11/15/2014   HDL 38 (L) 11/15/2014   VLDL 15 11/15/2014   LDLCALC 61 11/15/2014    Current Medications: Current Outpatient Medications  Medication Sig Dispense Refill  . divalproex (DEPAKOTE ER) 250 MG 24 hr tablet Take 250 mg by mouth daily.    . hydrOXYzine (ATARAX/VISTARIL) 25 MG tablet Take 1 tablet (25 mg total) by mouth every 8 (eight) hours as needed for anxiety (anxiety). 30 tablet 0  . ibuprofen (ADVIL,MOTRIN) 800 MG tablet Take 1 tablet (800 mg total) by mouth every 8 (eight) hours as needed. 21 tablet 0  . levothyroxine (SYNTHROID, LEVOTHROID) 25 MCG tablet Take 25 mcg by mouth daily before breakfast.    . QUEtiapine (SEROQUEL) 25 MG tablet Take 1 tablet (25 mg total) by mouth 2 (two) times daily. 28 tablet 0  . QUEtiapine (SEROQUEL) 50 MG tablet Take 1 tablet (50 mg total) by mouth at bedtime. (Patient not taking: Reported on 07/21/2015) 14 tablet 0  . traZODone (DESYREL) 50 MG tablet Take 1 tablet (50 mg total) by mouth at bedtime and may repeat dose one time if needed. 60 tablet 0   No current facility-administered medications for this encounter.   PTA Medications: (Not in a hospital admission)   Musculoskeletal: Strength & Muscle Tone: within normal limits Gait & Station: normal Patient leans: N/A  Psychiatric Specialty Exam: Physical Exam  Nursing note and vitals reviewed. Constitutional: She appears well-developed.  HENT:  Head: Normocephalic.  Eyes: Pupils are equal, round, and reactive to light.  Respiratory: Effort normal.   Musculoskeletal:        General: Normal range of motion.     Cervical back: Normal range of motion.  Neurological: She is alert.  Skin: Skin is warm and dry.  Psychiatric: Her speech is normal. Her mood appears anxious. Her affect is angry. She is agitated and hyperactive. Cognition and memory are normal. She expresses impulsivity. She exhibits a depressed mood. She expresses homicidal ideation.    Review of Systems  Psychiatric/Behavioral: Positive for agitation.  All other systems reviewed and are negative.   There were no vitals taken for this visit.There is no height or weight on file to calculate BMI.  General Appearance: Casual  Eye Contact:  Good  Speech:  Clear and Coherent  Volume:  Normal  Mood:  Angry, Anxious, Depressed and Hopeless  Affect:  Congruent  Thought Process:  Coherent and Descriptions of Associations: Intact  Orientation:  Full (Time, Place, and Person)  Thought Content:  Illogical  Suicidal Thoughts:  No  Homicidal Thoughts:  Yes.  without intent/plan  Memory:  Immediate;   Good  Judgement:  Impaired  Insight:  Lacking  Psychomotor Activity:  Normal  Concentration:  Concentration: Fair  Recall:  Good  Fund of Knowledge:  Good  Language:  Good  Akathisia:  NA  Handed:  Right  AIMS (if indicated):     Assets:  Communication Skills Desire for Improvement  ADL's:  Intact  Cognition:  WNL  Sleep:       Treatment Plan Summary: Daily contact with patient to assess and evaluate symptoms and progress in treatment, Medication management and Plan Inpatient admission to 300 hall  Observation Level/Precautions:  Elopement 15 minute checks  Laboratory:  CBC Chemistry Profile Folic Acid HbAIC UDS UA Vitamin B-12  Psychotherapy:    Medications:    Consultations:    Discharge Concerns:    Estimated LOS:  Other:     Physician Treatment Plan for Primary Diagnosis: Homicidal thoughts Long Term Goal(s): Improvement in symptoms so as ready for  discharge  Short Term Goals: Ability to identify changes in lifestyle to reduce recurrence of condition will improve, Ability to verbalize feelings will improve, Ability to demonstrate self-control will improve and Ability to identify and develop effective coping behaviors will improve  Physician Treatment Plan for Secondary Diagnosis: Principal Problem:   Homicidal thoughts Active Problems:   Agitation   Bipolar affective disorder, current episode mixed (HCC)  Long Term Goal(s): Improvement in symptoms so as ready for discharge  Short Term Goals: Ability to identify changes in lifestyle to reduce recurrence of condition will improve, Ability to verbalize feelings will improve, Ability to demonstrate self-control will improve and Ability to identify and develop effective coping behaviors will improve  I certify that inpatient services furnished can reasonably be expected to improve the patient's condition.    Jearld Lesch, NP 12/24/20207:07 PM

## 2019-10-25 NOTE — H&P (Addendum)
Behavioral Health Medical Screening Exam  Tami [name] is an 28 y.o. female that came in for anxiety, however I was unable to fully assess patient due to complaints of chest pains. Pt rated chest pains a 9 on a scale of 1-10.  @Approximately  18:15 Patient returned to [facility] brought back by the ambulance.  The ambulance gave pt an ekg and cleared her.  Pt is here for depression, anxiety and homicidal ideation towards her group home staff and aunt. Total Time spent with patient: 15 minutes  Psychiatric Specialty Exam: Physical Exam  Nursing note and vitals reviewed. Constitutional: She is oriented to person, place, and time. She appears well-developed.  HENT:  Head: Normocephalic.  Respiratory: Effort normal.  Musculoskeletal:        General: Normal range of motion.     Cervical back: Normal range of motion.  Neurological: She is alert and oriented to person, place, and time.  Skin: Skin is warm and dry.  Psychiatric: Her speech is normal. Judgment and thought content normal. Her mood appears anxious. She is hyperactive. Cognition and memory are normal.    Review of Systems  Psychiatric/Behavioral: Negative for behavioral problems and self-injury. The patient is nervous/anxious.   All other systems reviewed and are negative.   There were no vitals taken for this visit.There is no height or weight on file to calculate BMI.  General Appearance: Disheveled  Eye Contact:  Good  Speech:  Clear and Coherent  Volume:  Normal  Mood:  Anxious  Affect:  Congruent  Thought Process:  Coherent and Descriptions of Associations: Intact  Orientation:  Full (Time, Place, and Person)  Thought Content:  WDL  Suicidal Thoughts:  unable to assess  Homicidal Thoughts:  unable to assess  Memory:  Immediate;   Good  Judgement:  Other:  unable to assess  Insight:  unable to assess  Psychomotor Activity:  Normal  Concentration: Concentration: Good  Recall:  Good  Fund of Knowledge:Good  Language:  Good  Akathisia:  NA  Handed:  Right  AIMS (if indicated):     Assets:  Communication Skills  Sleep:       Musculoskeletal: Strength & Muscle Tone: within normal limits Gait & Station: normal Patient leans: N/A  There were no vitals taken for this visit.  Recommendations:  Based on my evaluation the patient appears to have an emergency medical condition for which I recommend the patient be transferred to the emergency department for further evaluation.  Deloria Lair, NP 10/25/2019, 5:21 PM

## 2019-10-26 LAB — COMPREHENSIVE METABOLIC PANEL
ALT: 25 U/L (ref 0–44)
AST: 17 U/L (ref 15–41)
Albumin: 3.6 g/dL (ref 3.5–5.0)
Alkaline Phosphatase: 103 U/L (ref 38–126)
Anion gap: 11 (ref 5–15)
BUN: 11 mg/dL (ref 6–20)
CO2: 21 mmol/L — ABNORMAL LOW (ref 22–32)
Calcium: 8.9 mg/dL (ref 8.9–10.3)
Chloride: 107 mmol/L (ref 98–111)
Creatinine, Ser: 0.81 mg/dL (ref 0.44–1.00)
GFR calc Af Amer: >60 mL/min (ref >60)
GFR calc non Af Amer: >60 mL/min (ref >60)
Glucose, Bld: 121 mg/dL — ABNORMAL HIGH (ref 70–99)
Potassium: 3.9 mmol/L (ref 3.5–5.1)
Sodium: 139 mmol/L (ref 135–145)
Total Bilirubin: 0.3 mg/dL (ref 0.3–1.2)
Total Protein: 7.2 g/dL (ref 6.5–8.1)

## 2019-10-26 LAB — HEPATIC FUNCTION PANEL
ALT: 30 U/L (ref 0–44)
AST: 15 U/L (ref 15–41)
Albumin: 3.5 g/dL (ref 3.5–5.0)
Alkaline Phosphatase: 104 U/L (ref 38–126)
Bilirubin, Direct: <0.1 mg/dL (ref 0.0–0.2)
Total Bilirubin: 0.4 mg/dL (ref 0.3–1.2)
Total Protein: 7.3 g/dL (ref 6.5–8.1)

## 2019-10-26 LAB — LIPID PANEL
Cholesterol: 200 mg/dL (ref 0–200)
HDL: 33 mg/dL — ABNORMAL LOW (ref >40)
LDL Cholesterol: 106 mg/dL — ABNORMAL HIGH (ref 0–99)
Total CHOL/HDL Ratio: 6.1 RATIO
Triglycerides: 305 mg/dL — ABNORMAL HIGH (ref <150)
VLDL: 61 mg/dL — ABNORMAL HIGH (ref 0–40)

## 2019-10-26 LAB — TSH: TSH: 1.159 u[IU]/mL (ref 0.350–4.500)

## 2019-10-26 LAB — RAPID URINE DRUG SCREEN, HOSP PERFORMED
Amphetamines: NOT DETECTED
Barbiturates: NOT DETECTED
Benzodiazepines: NOT DETECTED
Cocaine: NOT DETECTED
Opiates: NOT DETECTED
Tetrahydrocannabinol: NOT DETECTED

## 2019-10-26 LAB — ETHANOL: Alcohol, Ethyl (B): <10 mg/dL (ref <10)

## 2019-10-26 LAB — HEMOGLOBIN A1C
Hgb A1c MFr Bld: 4.6 % — ABNORMAL LOW (ref 4.8–5.6)
Mean Plasma Glucose: 85.32 mg/dL

## 2019-10-26 LAB — URINALYSIS, COMPLETE (UACMP) WITH MICROSCOPIC
Bacteria, UA: NONE SEEN
Bilirubin Urine: NEGATIVE
Glucose, UA: NEGATIVE mg/dL
Hgb urine dipstick: NEGATIVE
Ketones, ur: NEGATIVE mg/dL
Leukocytes,Ua: NEGATIVE
Nitrite: NEGATIVE
Protein, ur: NEGATIVE mg/dL
Specific Gravity, Urine: 1.013 (ref 1.005–1.030)
pH: 7 (ref 5.0–8.0)

## 2019-10-26 LAB — PREGNANCY, URINE: Preg Test, Ur: NEGATIVE

## 2019-10-26 LAB — VALPROIC ACID LEVEL: Valproic Acid Lvl: 81 ug/mL (ref 50.0–100.0)

## 2019-10-26 LAB — MAGNESIUM: Magnesium: 2.1 mg/dL (ref 1.7–2.4)

## 2019-10-26 MED ORDER — TRAZODONE HCL 100 MG PO TABS
200.0000 mg | ORAL_TABLET | Freq: Every evening | ORAL | Status: DC | PRN
  Administered 2019-10-26 – 2019-10-29 (×4): 200 mg via ORAL
  Filled 2019-10-26 (×4): qty 2

## 2019-10-26 MED ORDER — ILOPERIDONE 4 MG PO TABS
4.0000 mg | ORAL_TABLET | Freq: Three times a day (TID) | ORAL | Status: DC
  Administered 2019-10-26 – 2019-10-27 (×3): 4 mg via ORAL
  Filled 2019-10-26 (×9): qty 1
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
## 2019-10-26 NOTE — Plan of Care (Signed)
D: Pt denies SI/HI/AV hallucinations. Pt is pleasant and cooperative.  A: Pt was offered support and encouragement. Pt was given scheduled medications. Q 15 minute checks were done for safety.  R:Pt interacts well with peers and staff. Pt is taking medication. Pt has no complaints.Pt receptive to treatment and safety maintained on unit.    Problem: Safety: Goal: Periods of time without injury will increase Outcome: Progressing

## 2019-10-26 NOTE — Progress Notes (Addendum)
D.  Pt went to bed very soon after coming on to unit, states "I just need some sleep"  Appears irritable.  EKG deferred until tomorrow since patient did not get to unit until 2225 and stated she had already had medication to make her sleep.  Requested and was given ordered Trazodone, had received Vistaril in [facility] as she was requesting medication to help her "calm down".  Did not wake patient for late ordered medications due to irritability and request to "just let me sleep now".

## 2019-10-26 NOTE — Progress Notes (Signed)
Recreation Therapy Notes  Date: 12.25.20 Time: 0930-1015 Location: [address] Group Room  Group Topic: Self-Expression  Goal Area(s) Addresses:  Patient will successfully identify positive attributes about themselves.  Patient will successfully identify benefit of improved self-esteem.   Behavioral Response: Engaged  Intervention: Statistician, Music  Activity: Christmas Art.  Patients had the option of making Christmas card or any festive Christmas art of their choosing.  Education:  Self-Esteem, Dentist.   Education Outcome: Acknowledges education/In group clarification offered/Needs additional education  Clinical Observations/Feedback: Pt was engaged and active during activity.  Pt was also singing along to the music that played in group.  Pt was also social with peer.    Victorino Sparrow, LRT/CTRS    Victorino Sparrow A 10/26/2019 12:03 PM

## 2019-10-26 NOTE — [facility] Group Notes (Signed)
Adult Psychoeducational Group Note  Date:  10/26/2019 Time:  8:53 PM  Group Topic/Focus:  Wrap-Up Group:   The focus of this group is to help patients review their daily goal of treatment and discuss progress on daily workbooks.  Participation Level:  Active  Participation Quality:  Appropriate and Attentive  Affect:  Appropriate  Cognitive:  Alert and Appropriate  Insight: Appropriate and Good  Engagement in Group:  Engaged  Modes of Intervention:  Discussion and Education   Additional Comments: Pt attended and participated in wrap up group this evening.     Cristi Loron 10/26/2019, 8:53 PM

## 2019-10-26 NOTE — Progress Notes (Signed)
D:  Patient's self inventory sheet, patient has fair sleep, sleep medication not helpful.  Fair appetite, hyper energy level, good concentration.  Depression IDK, anxiety 8.  Denied withdrawals.  Denied SI.  Denied physical problems.  Physical pain, arms, worst pain #4. Pain medicine IDK.  Goal attitude and not flip on other.  Plans to walk away.  Needs different medicines that will work. A:  Medications administered per MD orders.  Emotional support and encouragement given patient. R:  Denied SI, contracts for safety.  Does have HI thoughts to hurt other people if they aggravate or try to hurt her.  Will get revenge on them and make them suffer. Sometimes does hear voices, talks to herself.

## 2019-10-26 NOTE — Tx Team (Signed)
Interdisciplinary Treatment and Diagnostic Plan Update  10/26/2019 Time of Session: 10:00am Tami [name] MRN: [id number]  Principal Diagnosis: Homicidal thoughts  Secondary Diagnoses: Principal Problem:   Homicidal thoughts Active Problems:   Agitation   Bipolar affective disorder, current episode mixed (HCC)   MDD (major depressive disorder)   Current Medications:  Current Facility-Administered Medications  Medication Dose Route Frequency Provider Last Rate Last Admin  . acetaminophen (TYLENOL) tablet 650 mg  650 mg Oral Q6H PRN Deloria Lair, NP   650 mg at 10/26/19 0813  . alum & mag hydroxide-simeth (MAALOX/MYLANTA) 200-200-20 MG/5ML suspension 30 mL  30 mL Oral Q4H PRN Dixon, Rashaun M, NP      . divalproex (DEPAKOTE) DR tablet 500 mg  500 mg Oral Q12H Anike, Adaku C, NP   500 mg at 10/26/19 0732  . hydrOXYzine (ATARAX/VISTARIL) tablet 25 mg  25 mg Oral TID PRN Deloria Lair, NP   25 mg at 10/26/19 0732  . iloperidone (FANAPT) tablet 4 mg  4 mg Oral TID Johnn Hai, MD      . levothyroxine (SYNTHROID) tablet 25 mcg  25 mcg Oral Q0600 Anike, Adaku C, NP   25 mcg at 10/26/19 0700  . magnesium hydroxide (MILK OF MAGNESIA) suspension 30 mL  30 mL Oral Daily PRN Deloria Lair, NP      . traZODone (DESYREL) tablet 200 mg  200 mg Oral QHS PRN Johnn Hai, MD       PTA Medications: Medications Prior to Admission  Medication Sig Dispense Refill Last Dose  . polyethylene glycol (MIRALAX / GLYCOLAX) 17 g packet Take 17 g by mouth daily.     . divalproex (DEPAKOTE ER) 500 MG 24 hr tablet Take 500 mg by mouth 2 (two) times daily.     Marland Kitchen donepezil (ARICEPT) 5 MG tablet Take 5 mg by mouth at bedtime.     . fluticasone (FLONASE) 50 MCG/ACT nasal spray 2 sprays daily.     Marland Kitchen levothyroxine (SYNTHROID, LEVOTHROID) 25 MCG tablet Take 25 mcg by mouth daily before breakfast.     . medroxyPROGESTERone (DEPO-PROVERA) 150 MG/ML injection Inject 150 mg into the muscle every 3 (three)  months.     . polyethylene glycol powder (GLYCOLAX/MIRALAX) 17 GM/SCOOP powder SMARTSIG:1 Tablespoon By Mouth Daily     . QUEtiapine (SEROQUEL) 25 MG tablet Take 1 tablet (25 mg total) by mouth 2 (two) times daily. (Patient taking differently: Take 25 mg by mouth 3 (three) times daily. ) 28 tablet 0   . traZODone (DESYREL) 100 MG tablet Take 100 mg by mouth at bedtime.       Patient Stressors: Loss of Mother Traumatic event Other: Conflict with staff at her group home  Patient Strengths: Communication skills Motivation for treatment/growth  Treatment Modalities: Medication Management, Group therapy, Case management,  1 to 1 session with clinician, Psychoeducation, Recreational therapy.   Physician Treatment Plan for Primary Diagnosis: Homicidal thoughts Long Term Goal(s): Improvement in symptoms so as ready for discharge Improvement in symptoms so as ready for discharge   Short Term Goals: Ability to identify changes in lifestyle to reduce recurrence of condition will improve Ability to verbalize feelings will improve Ability to disclose and discuss suicidal ideas Ability to demonstrate self-control will improve Ability to identify and develop effective coping behaviors will improve Ability to maintain clinical measurements within normal limits will improve Compliance with prescribed medications will improve Ability to identify changes in lifestyle to reduce recurrence of condition will improve  Ability to disclose and discuss suicidal ideas Ability to identify and develop effective coping behaviors will improve  Medication Management: Evaluate patient's response, side effects, and tolerance of medication regimen.  Therapeutic Interventions: 1 to 1 sessions, Unit Group sessions and Medication administration.  Evaluation of Outcomes: Progressing  Physician Treatment Plan for Secondary Diagnosis: Principal Problem:   Homicidal thoughts Active Problems:   Agitation   Bipolar  affective disorder, current episode mixed (HCC)   MDD (major depressive disorder)  Long Term Goal(s): Improvement in symptoms so as ready for discharge Improvement in symptoms so as ready for discharge   Short Term Goals: Ability to identify changes in lifestyle to reduce recurrence of condition will improve Ability to verbalize feelings will improve Ability to disclose and discuss suicidal ideas Ability to demonstrate self-control will improve Ability to identify and develop effective coping behaviors will improve Ability to maintain clinical measurements within normal limits will improve Compliance with prescribed medications will improve Ability to identify changes in lifestyle to reduce recurrence of condition will improve Ability to disclose and discuss suicidal ideas Ability to identify and develop effective coping behaviors will improve     Medication Management: Evaluate patient's response, side effects, and tolerance of medication regimen.  Therapeutic Interventions: 1 to 1 sessions, Unit Group sessions and Medication administration.  Evaluation of Outcomes: Progressing   RN Treatment Plan for Primary Diagnosis: Homicidal thoughts Long Term Goal(s): Knowledge of disease and therapeutic regimen to maintain health will improve  Short Term Goals: Ability to participate in decision making will improve, Ability to verbalize feelings will improve, Ability to disclose and discuss suicidal ideas, Ability to identify and develop effective coping behaviors will improve and Compliance with prescribed medications will improve  Medication Management: RN will administer medications as ordered by provider, will assess and evaluate patient's response and provide education to patient for prescribed medication. RN will report any adverse and/or side effects to prescribing provider.  Therapeutic Interventions: 1 on 1 counseling sessions, Psychoeducation, Medication administration, Evaluate  responses to treatment, Monitor vital signs and CBGs as ordered, Perform/monitor CIWA, COWS, AIMS and Fall Risk screenings as ordered, Perform wound care treatments as ordered.  Evaluation of Outcomes: Progressing   LCSW Treatment Plan for Primary Diagnosis: Homicidal thoughts Long Term Goal(s): Safe transition to appropriate next level of care at discharge, Engage patient in therapeutic group addressing interpersonal concerns.  Short Term Goals: Engage patient in aftercare planning with referrals and resources and Increase skills for wellness and recovery  Therapeutic Interventions: Assess for all discharge needs, 1 to 1 time with Social worker, Explore available resources and support systems, Assess for adequacy in community support network, Educate family and significant other(s) on suicide prevention, Complete Psychosocial Assessment, Interpersonal group therapy.  Evaluation of Outcomes: Progressing   Progress in Treatment: Attending groups: No. Participating in groups: No. Taking medication as prescribed: Yes. Toleration medication: Yes. Family/Significant other contact made: No, will contact:  will contact pt's legal guardian Patient understands diagnosis: No. Discussing patient identified problems/goals with staff: Yes. Medical problems stabilized or resolved: Yes. Denies suicidal/homicidal ideation: No.; HI against group home owner Issues/concerns per patient self-inventory: No. Other:   New problem(s) identified: No, Describe:  None  New Short Term/Long Term Goal(s): Medication stabilization, elimination of SI thoughts, and development of a comprehensive mental wellness plan.   Patient Goals:    Discharge Plan or Barriers: CSW will continue to follow up for appropriate referrals and possible discharge planning  Reason for Continuation of Hospitalization: Aggression Hallucinations Medication  stabilization  Estimated Length of Stay: 3-5 days   Attendees: Patient:  10/26/2019  Physician: Dr. Malvin JohnsBrian Farah, MD  10/26/2019   Nursing: Meriam SpragueBeverly, RN 10/26/2019   RN Care Manager: 10/26/2019   Social Worker: Stephannie PetersJasmine Brenae Lasecki, LCSW  10/26/2019  Recreational Therapist:  10/26/2019   Other:  10/26/2019   Other:  10/26/2019   Other: 10/26/2019     Scribe for Treatment Team: Delphia GratesJasmine M Jaleen Grupp, LCSW 10/26/2019 11:29 AM

## 2019-10-27 MED ORDER — ILOPERIDONE 2 MG PO TABS
4.0000 mg | ORAL_TABLET | Freq: Three times a day (TID) | ORAL | Status: DC
  Administered 2019-10-28 – 2019-10-30 (×8): 4 mg via ORAL
  Filled 2019-10-27 (×15): qty 2

## 2019-10-27 MED ORDER — NICOTINE POLACRILEX 2 MG MT GUM
2.0000 mg | CHEWING_GUM | OROMUCOSAL | Status: DC | PRN
  Administered 2019-10-27: 2 mg via ORAL

## 2019-10-27 NOTE — Progress Notes (Addendum)
[facility]  10/27/2019 11:16 AM Tami [name]  MRN:  [id number] Subjective:  "I'm ok."  Tami [name] found sitting in her room. She was admitted from group home due to aggressive behaviors. She appears calm on assessment but reports her mood is "all over the place." She continues to report frustration with group home staff and complains of the rules and treatment at the group home. She denies HI today but states "I get that way when people frustrate me." She denies any anger toward staff or patients on the unit and states, "I like it here." She was started on Fanapt yesterday. Denies medication side effects. Denies AVH and shows no signs of responding to internal stimuli. Denies SI. She has been participating in groups. No agitated or disruptive behaviors on the unit.  From admission H&P: This is the latest of multiple admissions and healthcare system encounters for Tami [name], the patient reports she has been hospitalized at least 6 times.  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 Her initial complaint was homicidal thoughts towards group home staff and her aunt.  Principal Problem: Homicidal thoughts Diagnosis: Principal Problem:   Homicidal thoughts Active Problems:   Agitation   Bipolar affective disorder, current episode mixed (HCC)   MDD (major depressive disorder)  Total Time spent with patient: 15 minutes  Past Psychiatric History: See admission H&P  Past Medical History:  Past Medical History:  Diagnosis Date  . ADHD (attention deficit hyperactivity disorder)   . Bipolar disorder (HCC)   . Chronic back pain   . Seizures (HCC)     Past Surgical History:  Procedure Laterality Date  . THROAT SURGERY     throat surgery when younger doesn't know why   Family History:  Family History  Problem Relation Age of Onset  . Dementia Neg  Hx    Family Psychiatric  History: See admission H&P Social History:  Social History   Substance and Sexual Activity  Alcohol Use No  . Alcohol/week: 0.0 standard drinks     Social History   Substance and Sexual Activity  Drug Use No    Social History   Socioeconomic History  . Marital status: Single    Spouse name: Not on file  . Number of children: Not on file  . Years of education: GED  . Highest education level: Not on file  Occupational History  . Not on file  Tobacco Use  . Smoking status: Current Some Day Smoker    Packs/day: 0.25    Years: 4.00    Pack years: 1.00    Types: Cigarettes  . Smokeless tobacco: Never Used  . Tobacco comment: pt says last smoked was 2 yrs ago  Substance and Sexual Activity  . Alcohol use: No    Alcohol/week: 0.0 standard drinks  . Drug use: No  . Sexual activity: Not Currently    Birth control/protection: Other-see comments    Comment: pt says she gets Depo shot  Other Topics Concern  . Not on file  Social History Narrative   Lives at other Residents and staff of group home   Caffeine use: Drinks soda rarely    Social Determinants of Health   Financial Resource Strain:   . Difficulty of Paying Living Expenses: Not on file  Food Insecurity:   . Worried About Programme researcher, broadcasting/film/videounning Out of Food in the Last Year: Not on file  . Ran  Out of Food in the Last Year: Not on file  Transportation Needs:   . Lack of Transportation (Medical): Not on file  . Lack of Transportation (Non-Medical): Not on file  Physical Activity:   . Days of Exercise per Week: Not on file  . Minutes of Exercise per Session: Not on file  Stress:   . Feeling of Stress : Not on file  Social Connections:   . Frequency of Communication with Friends and Family: Not on file  . Frequency of Social Gatherings with Friends and Family: Not on file  . Attends Religious Services: Not on file  . Active Member of Clubs or Organizations: Not on file  . Attends Banker  Meetings: Not on file  . Marital Status: Not on file   Additional Social History:    Pain Medications: Denies abuse Prescriptions: Denies abuse Over the Counter: Denies abuse History of alcohol / drug use?: Yes(Pt has history of marijuana use. Denies recent use.) Longest period of sobriety (when/how long): Unknown                    Sleep: Fair  Appetite:  Good  Current Medications: Current Facility-Administered Medications  Medication Dose Route Frequency Provider Last Rate Last Admin  . acetaminophen (TYLENOL) tablet 650 mg  650 mg Oral Q6H PRN Jearld Lesch, NP   650 mg at 10/27/19 0147  . alum & mag hydroxide-simeth (MAALOX/MYLANTA) 200-200-20 MG/5ML suspension 30 mL  30 mL Oral Q4H PRN Dixon, Rashaun M, NP      . divalproex (DEPAKOTE) DR tablet 500 mg  500 mg Oral Q12H Anike, Adaku C, NP   500 mg at 10/27/19 0840  . hydrOXYzine (ATARAX/VISTARIL) tablet 25 mg  25 mg Oral TID PRN Jearld Lesch, NP   25 mg at 10/26/19 1819  . iloperidone (FANAPT) tablet 4 mg  4 mg Oral TID Malvin Johns, MD   4 mg at 10/27/19 0840  . levothyroxine (SYNTHROID) tablet 25 mcg  25 mcg Oral Q0600 Anike, Adaku C, NP   25 mcg at 10/27/19 0701  . magnesium hydroxide (MILK OF MAGNESIA) suspension 30 mL  30 mL Oral Daily PRN Durwin Nora, Rashaun M, NP      . nicotine polacrilex (NICORETTE) gum 2 mg  2 mg Oral PRN Teyana Pierron, Rockey Situ, MD      . traZODone (DESYREL) tablet 200 mg  200 mg Oral QHS PRN Malvin Johns, MD   200 mg at 10/26/19 2200    Lab Results:  Results for orders placed or performed during the hospital encounter of 10/25/19 (from the past 48 hour(s))  Respiratory Panel by RT PCR (Flu A&B, Covid) - Nasopharyngeal Swab     Status: None   Collection Time: 10/25/19  7:02 PM   Specimen: Nasopharyngeal Swab  Result Value Ref Range   SARS Coronavirus 2 by RT PCR NEGATIVE NEGATIVE    Comment: (NOTE) SARS-CoV-2 target nucleic acids are NOT DETECTED. The SARS-CoV-2 RNA is generally detectable in  upper respiratoy specimens during the acute phase of infection. The lowest concentration of SARS-CoV-2 viral copies this assay can detect is 131 copies/mL. A negative result does not preclude SARS-Cov-2 infection and should not be used as the sole basis for treatment or other patient management decisions. A negative result may occur with  improper specimen collection/handling, submission of specimen other than nasopharyngeal swab, presence of viral mutation(s) within the areas targeted by this assay, and inadequate number of viral copies (<131 copies/mL).  A negative result must be combined with clinical observations, patient history, and epidemiological information. The expected result is Negative. Fact Sheet for Patients:  https://www.moore.com/ Fact Sheet for Healthcare Providers:  https://www.young.biz/ This test is not yet ap proved or cleared by the [place] FDA and  has been authorized for detection and/or diagnosis of SARS-CoV-2 by FDA under an Emergency Use Authorization (EUA). This EUA will remain  in effect (meaning this test can be used) for the duration of the COVID-19 declaration under Section 564(b)(1) of the Act, 21 U.S.C. section 360bbb-3(b)(1), unless the authorization is terminated or revoked sooner.    Influenza A by PCR NEGATIVE NEGATIVE   Influenza B by PCR NEGATIVE NEGATIVE    Comment: (NOTE) The Xpert Xpress SARS-CoV-2/FLU/RSV assay is intended as an aid in  the diagnosis of influenza from Nasopharyngeal swab specimens and  should not be used as a sole basis for treatment. Nasal washings and  aspirates are unacceptable for Xpert Xpress SARS-CoV-2/FLU/RSV  testing. Fact Sheet for Patients: https://www.moore.com/ Fact Sheet for Healthcare Providers: https://www.young.biz/ This test is not yet approved or cleared by the [place] FDA and  has been authorized for detection  and/or diagnosis of SARS-CoV-2 by  FDA under an Emergency Use Authorization (EUA). This EUA will remain  in effect (meaning this test can be used) for the duration of the  Covid-19 declaration under Section 564(b)(1) of the Act, 21  U.S.C. section 360bbb-3(b)(1), unless the authorization is  terminated or revoked. Performed at [facility], [address], [place], [place] [address]   Urinalysis, Complete w Microscopic     Status: None   Collection Time: 10/25/19  7:30 PM  Result Value Ref Range   Color, Urine YELLOW YELLOW   APPearance CLEAR CLEAR   Specific Gravity, Urine 1.013 1.005 - 1.030   pH 7.0 5.0 - 8.0   Glucose, UA NEGATIVE NEGATIVE mg/dL   Hgb urine dipstick NEGATIVE NEGATIVE   Bilirubin Urine NEGATIVE NEGATIVE   Ketones, ur NEGATIVE NEGATIVE mg/dL   Protein, ur NEGATIVE NEGATIVE mg/dL   Nitrite NEGATIVE NEGATIVE   Leukocytes,Ua NEGATIVE NEGATIVE   WBC, UA 0-5 0 - 5 WBC/hpf   Bacteria, UA NONE SEEN NONE SEEN   Squamous Epithelial / LPF 0-5 0 - 5   Mucus PRESENT     Comment: Performed at [facility], [address] [place], [place] [address]  Pregnancy, urine     Status: None   Collection Time: 10/25/19  7:30 PM  Result Value Ref Range   Preg Test, Ur NEGATIVE NEGATIVE    Comment:        THE SENSITIVITY OF THIS METHODOLOGY IS >20 mIU/mL. Performed at [facility], [address] [place], [place] [address]   Urine rapid drug screen (hosp performed)not at [facility]     Status: None   Collection Time: 10/25/19  7:30 PM  Result Value Ref Range   Opiates NONE DETECTED NONE DETECTED   Cocaine NONE DETECTED NONE DETECTED   Benzodiazepines NONE DETECTED NONE DETECTED   Amphetamines NONE DETECTED NONE DETECTED   Tetrahydrocannabinol NONE DETECTED NONE DETECTED   Barbiturates NONE DETECTED NONE DETECTED    Comment: (NOTE) DRUG SCREEN FOR MEDICAL PURPOSES ONLY.  IF CONFIRMATION IS NEEDED FOR ANY PURPOSE, NOTIFY  LAB WITHIN 5 DAYS. LOWEST DETECTABLE LIMITS FOR URINE DRUG SCREEN Drug Class                     Cutoff (  ng/mL) Amphetamine and metabolites    1000 Barbiturate and metabolites    200 Benzodiazepine                 200 Tricyclics and metabolites     300 Opiates and metabolites        300 Cocaine and metabolites        300 THC                            50 Performed at [facility], [address], [place], [place] [address]   Comprehensive metabolic panel     Status: Abnormal   Collection Time: 10/26/19  6:12 PM  Result Value Ref Range   Sodium 139 135 - 145 mmol/L   Potassium 3.9 3.5 - 5.1 mmol/L   Chloride 107 98 - 111 mmol/L   CO2 21 (L) 22 - 32 mmol/L   Glucose, Bld 121 (H) 70 - 99 mg/dL   BUN 11 6 - 20 mg/dL   Creatinine, Ser 6.04 0.44 - 1.00 mg/dL   Calcium 8.9 8.9 - 54.0 mg/dL   Total Protein 7.2 6.5 - 8.1 g/dL   Albumin 3.6 3.5 - 5.0 g/dL   AST 17 15 - 41 U/L   ALT 25 0 - 44 U/L   Alkaline Phosphatase 103 38 - 126 U/L   Total Bilirubin 0.3 0.3 - 1.2 mg/dL   GFR calc non Af Amer >60 >60 mL/min   GFR calc Af Amer >60 >60 mL/min   Anion gap 11 5 - 15    Comment: Performed at [facility], [address], [place], [place] [address]  Hemoglobin A1c     Status: Abnormal   Collection Time: 10/26/19  6:12 PM  Result Value Ref Range   Hgb A1c MFr Bld 4.6 (L) 4.8 - 5.6 %    Comment: (NOTE) Pre diabetes:          5.7%-6.4% Diabetes:              >6.4% Glycemic control for   <7.0% adults with diabetes    Mean Plasma Glucose 85.32 mg/dL    Comment: Performed at [facility], [address] [place], [place] [address]  Magnesium     Status: None   Collection Time: 10/26/19  6:12 PM  Result Value Ref Range   Magnesium 2.1 1.7 - 2.4 mg/dL    Comment: Performed at [facility], [address], [place], [place] [address]  Ethanol     Status: None   Collection Time: 10/26/19  6:12 PM  Result Value Ref Range    Alcohol, Ethyl (B) <10 <10 mg/dL    Comment: (NOTE) Lowest detectable limit for serum alcohol is 10 mg/dL. For medical purposes only. Performed at [facility], [address], [place], [place] [address]   Lipid panel     Status: Abnormal   Collection Time: 10/26/19  6:12 PM  Result Value Ref Range   Cholesterol 200 0 - 200 mg/dL   Triglycerides 784 (H) <150 mg/dL   HDL 33 (L) >69 mg/dL   Total CHOL/HDL Ratio 6.1 RATIO   VLDL 61 (H) 0 - 40 mg/dL   LDL Cholesterol 629 (H) 0 - 99 mg/dL    Comment:        Total Cholesterol/HDL:CHD Risk Coronary Heart Disease Risk Table  Men   Women  1/2 Average Risk   3.4   3.3  Average Risk       5.0   4.4  2 X Average Risk   9.6   7.1  3 X Average Risk  23.4   11.0        Use the calculated Patient Ratio above and the CHD Risk Table to determine the patient's CHD Risk.        ATP III CLASSIFICATION (LDL):  <100     mg/dL   Optimal  222-979  mg/dL   Near or Above                    Optimal  130-159  mg/dL   Borderline  892-119  mg/dL   High  >417     mg/dL   Very High Performed at [facility], [address] [place], [place] [address]   Hepatic function panel     Status: None   Collection Time: 10/26/19  6:12 PM  Result Value Ref Range   Total Protein 7.3 6.5 - 8.1 g/dL   Albumin 3.5 3.5 - 5.0 g/dL   AST 15 15 - 41 U/L   ALT 30 0 - 44 U/L   Alkaline Phosphatase 104 38 - 126 U/L   Total Bilirubin 0.4 0.3 - 1.2 mg/dL   Bilirubin, Direct <4.8 0.0 - 0.2 mg/dL   Indirect Bilirubin NOT CALCULATED 0.3 - 0.9 mg/dL    Comment: Performed at [facility], [address], [place], [place] [address]  TSH     Status: None   Collection Time: 10/26/19  6:12 PM  Result Value Ref Range   TSH 1.159 0.350 - 4.500 uIU/mL    Comment: Performed by a 3rd Generation assay with a functional sensitivity of <=0.01 uIU/mL. Performed at [facility], [address] [place], [place] [address]   Valproic acid level     Status: None   Collection Time: 10/26/19  6:12 PM  Result Value Ref Range   Valproic Acid Lvl 81 50.0 - 100.0 ug/mL    Comment: Performed at [facility], [address] [place], [place] [address]    Blood Alcohol level:  Lab Results  Component Value Date   [facility] <10 10/26/2019   ETH <10 07/27/2017    Metabolic Disorder Labs: Lab Results  Component Value Date   HGBA1C 4.6 (L) 10/26/2019   MPG 85.32 10/26/2019   No results found for: PROLACTIN Lab Results  Component Value Date   CHOL 200 10/26/2019   TRIG 305 (H) 10/26/2019   HDL 33 (L) 10/26/2019   CHOLHDL 6.1 10/26/2019   VLDL 61 (H) 10/26/2019   LDLCALC 106 (H) 10/26/2019   LDLCALC 61 11/15/2014    Physical Findings: AIMS: Facial and Oral Movements Muscles of Facial Expression: None, normal Lips and Perioral Area: None, normal Jaw: None, normal Tongue: None, normal,Extremity Movements Upper (arms, wrists, hands, fingers): None, normal Lower (legs, knees, ankles, toes): None, normal, Trunk Movements Neck, shoulders, hips: None, normal, Overall Severity Severity of abnormal movements (highest score from questions above): None, normal Incapacitation due to abnormal movements: None, normal Patient's awareness of abnormal movements (rate only patient's report): No Awareness, Dental Status Current problems with teeth and/or dentures?: No Does patient usually wear dentures?: No  CIWA:  CIWA-Ar Total: 7 COWS:  COWS Total Score: 3  Musculoskeletal: Strength & Muscle Tone: within normal limits Gait & Station:  normal Patient leans: N/A  Psychiatric Specialty Exam: Physical Exam  Nursing note and vitals reviewed. Constitutional: She is oriented to person, place, and time. She appears well-developed and well-nourished.  Cardiovascular: Normal rate.  Respiratory: Effort normal.  Neurological: She is alert and oriented to person, place,  and time.    Review of Systems  Constitutional: Negative.   Respiratory: Negative for cough and shortness of breath.   Psychiatric/Behavioral: Negative for agitation, behavioral problems, decreased concentration, dysphoric mood, hallucinations, self-injury, sleep disturbance and suicidal ideas. The patient is not nervous/anxious.     Blood pressure (!) 115/56, pulse (!) 102, temperature 98 F (36.7 C), temperature source Oral, resp. rate 16, height 4\' 9"  (1.448 m), weight 81.2 kg, SpO2 99 %.Body mass index is 38.74 kg/m.  General Appearance: Casual  Eye Contact:  Good  Speech:  Normal Rate  Volume:  Increased  Mood:  Euthymic  Affect:  Appropriate and Congruent  Thought Process:  Coherent  Orientation:  Full (Time, Place, and Person)  Thought Content:  Rumination and Tangential  Suicidal Thoughts:  No  Homicidal Thoughts:  No  Memory:  Immediate;   Fair Recent;   Fair  Judgement:  Fair  Insight:  Shallow  Psychomotor Activity:  Normal  Concentration:  Concentration: Fair and Attention Span: Poor  Recall:  [employer] of Knowledge:  Poor  Language:  Fair  Akathisia:  No  Handed:  Right  AIMS (if indicated):     Assets:  Communication Skills Desire for Improvement Housing Resilience  ADL's:  Intact  Cognition:  WNL  Sleep:  Number of Hours: 4.75     Treatment Plan Summary: Daily contact with patient to assess and evaluate symptoms and progress in treatment and Medication management   Continue inpatient hospitalization.  Continue Depakote 500 mg PO BID for mood instability Continue Fanapt 4 mg PO TID for mood instability Continue Synthroid 25 mcg PO daily for hypothyroidism Continue Vistaril 25 mg PO TID PRN anxiety Continue trazodone 200 mg PO QHS PRN insomnia  Patient will participate in the therapeutic group milieu.  Discharge disposition in progress.   Connye Burkitt, NP 10/27/2019, 11:16 AM   Attest to NP Note
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
## 2019-10-27 NOTE — Progress Notes (Signed)
   10/27/19 1300  Psych Admission Type (Psych Patients Only)  Admission Status Voluntary  Psychosocial Assessment  Patient Complaints Anxiety;Depression  Eye Contact Fair  Facial Expression Anxious  Affect Anxious  Speech Logical/coherent  Interaction Assertive  Motor Activity Other (Comment) (steady gait)  Appearance/Hygiene Unremarkable  Behavior Characteristics Cooperative  Mood Depressed  Thought Process  Coherency WDL  Content Blaming others  Delusions None reported or observed  Perception WDL  Hallucination None reported or observed  Judgment Poor  Confusion None  Danger to Self  Current suicidal ideation? Denies  Danger to Others  Danger to Others None reported or observed  Danger to Others Abnormal  Harmful Behavior to others No threats or harm toward other people  Destructive Behavior No threats or harm toward property

## 2019-10-27 NOTE — Progress Notes (Signed)
[place] NOVEL CORONAVIRUS (COVID-19) DAILY CHECK-OFF SYMPTOMS - answer yes or no to each - every day NO YES  Have you had a fever in the past 24 hours?  . Fever (Temp > 37.80C / 100F) X   Have you had any of these symptoms in the past 24 hours? . New Cough .  Sore Throat  .  Shortness of Breath .  Difficulty Breathing .  Unexplained Body Aches   X   Have you had any one of these symptoms in the past 24 hours not related to allergies?   . Runny Nose .  Nasal Congestion .  Sneezing   X   If you have had runny nose, nasal congestion, sneezing in the past 24 hours, has it worsened?  X   EXPOSURES - check yes or no X   Have you traveled outside the state in the past 14 days?  X   Have you been in contact with someone with a confirmed diagnosis of COVID-19 or PUI in the past 14 days without wearing appropriate PPE?  X   Have you been living in the same home as a person with confirmed diagnosis of COVID-19 or a PUI (household contact)?    X   Have you been diagnosed with COVID-19?    X              What to do next: Answered NO to all: Answered YES to anything:   Proceed with unit schedule Follow the [facility] Inpatient Flowsheet.   

## 2019-10-28 LAB — PROLACTIN: Prolactin: 111 ng/mL — ABNORMAL HIGH (ref 4.8–23.3)

## 2019-10-28 NOTE — Progress Notes (Signed)
EKG results placed on the outside of shadow chart  Sinus tachycardia Otherwise normal ECG  103 bpm  QT/QTc   358/468 ms

## 2019-10-28 NOTE — Progress Notes (Signed)
[place] NOVEL CORONAVIRUS (COVID-19) DAILY CHECK-OFF SYMPTOMS - answer yes or no to each - every day NO YES  Have you had a fever in the past 24 hours?  . Fever (Temp > 37.80C / 100F) X   Have you had any of these symptoms in the past 24 hours? . New Cough .  Sore Throat  .  Shortness of Breath .  Difficulty Breathing .  Unexplained Body Aches   X   Have you had any one of these symptoms in the past 24 hours not related to allergies?   . Runny Nose .  Nasal Congestion .  Sneezing   X   If you have had runny nose, nasal congestion, sneezing in the past 24 hours, has it worsened?  X   EXPOSURES - check yes or no X   Have you traveled outside the state in the past 14 days?  X   Have you been in contact with someone with a confirmed diagnosis of COVID-19 or PUI in the past 14 days without wearing appropriate PPE?  X   Have you been living in the same home as a person with confirmed diagnosis of COVID-19 or a PUI (household contact)?    X   Have you been diagnosed with COVID-19?    X              What to do next: Answered NO to all: Answered YES to anything:   Proceed with unit schedule Follow the [facility] Inpatient Flowsheet.   

## 2019-10-28 NOTE — Progress Notes (Signed)
Patient ID: Tami [name], female   DOB: 1991/05/17, 28 y.o.   MRN: [id number] D: Pt denies SI/HI/AVH, was visible in the milieu at the beginning of shift interacting with peers.  A: All meds given as ordered, Q15 minute checks in place for safety.  R: Will continue to monitor on Q15 minute checks, pt denies any current concerns and is in bed resting with no signs of distress.

## 2019-10-28 NOTE — [facility] Group Notes (Signed)
[place] LCSW Group Therapy Note  10/28/2019  10:00-11:00AM  Type of Therapy and Topic:  Group Therapy:  A Hero Worthy of Support  Participation Level:  Active   Description of Group:  Patients in this group were introduced to the concept that additional supports including self-support are an essential part of recovery.  Matching needs with supports to help fulfill those needs was explained.  A song entitled "My Own Hero" was played and a group discussion ensued in which patients stated it inspired them to help themselves in order to succeed, because other people cannot achieve their goals such as sobriety or stability for them.  A song was played called "I Am Enough" which led to a discussion about being willing to believe we are worth the effort of being a self-support.   Therapeutic Goals: 1)  demonstrate the importance of being a key part of one's own support system 2)  discuss various available supports 3)  encourage patient to use music as part of their self-support and focus on goals 4)  elicit ideas from patients about supports that need to be added   Summary of Patient Progress:  The patient expressed that her friends are healthy for her now while the group home staff is not healthy for her.  She lacked insight for the most part into the idea of being a hero to herself by improving her self-support, stating that she wants to be someone else's hero, not her own.  Her remarks were often off topic but she could be redirected.  Therapeutic Modalities:   Motivational Interviewing Activity  Tami [name]

## 2019-10-28 NOTE — Progress Notes (Signed)
D. Pt calm and cooperative, pleasant upon initial approach- pt reported having nightmares last night, stating, "my dream was so scary I wet my pants".   Pt currently denies SI/HI and AVH A. Labs and vitals monitored. Pt compliant with medications. Pt supported emotionally and encouraged to express concerns and ask questions.   R. Pt remains safe with 15 minute checks. Will continue POC.

## 2019-10-28 NOTE — Progress Notes (Signed)
Patient ID: Tami [name], female   DOB: 06-Apr-1991, 28 y.o.   MRN: [id number] D: Pt calm and cooperative, visible earlier in shift in the day room interacting with peers. Pt denies SI/HI/AVH.  A: All meds given as ordered, pt asked for, and received Trazodone 200mg  for insomnia, and is being maintained on Q15 minute checks for safety.    R: Will continue Q15 minute safety checks.

## 2019-10-28 NOTE — [facility] Group Notes (Signed)
Adult Psychoeducational Group Note  Date:  10/28/2019 Time:  8:56 PM  Group Topic/Focus:  Wrap-Up Group:   The focus of this group is to help patients review their daily goal of treatment and discuss progress on daily workbooks.  Participation Level:  Active  Participation Quality:  Appropriate  Affect:  Appropriate  Cognitive:  Appropriate  Insight: Appropriate  Engagement in Group:  Engaged  Modes of Intervention:  Discussion  Additional Comments:  Pt stated her goal was to get in contact with staff at her group home because she is need of clothes.  Pt stated she did meet her goal and rated the day at a 7/10.  Janeice Stegall 10/28/2019, 8:56 PM

## 2019-10-28 NOTE — Progress Notes (Signed)
[facility] MD Progress Note  10/28/2019 1:14 PM [place]  MRN:  [id number] Subjective: Patient states "I am okay".  Currently does not endorse medication side effects. Objective: I have reviewed chart notes and met with patient 28 year old female, [place] resident, history of schizoaffective disorder, PTSD, borderline personality disorder traits, borderline intellectual functioning.  Presented to hospital reporting increased anxiety.  On admission also reported significant and worsening chest pain due to which she was evaluated /cleared in ED.  Patient reported anger towards a staff member at her group home, she states was treating her unfairly and who had made her angry by punching a centerpiece which could have caused the glass to get into patient's food.  Today patient states "I am okay".  Denies suicidal ideations.  At this time denies any homicidal ideations towards anyone and specifically also denies any HI towards group home staff member.  States "I am just going to try to avoid her". No disruptive or agitated behaviors on unit, visible in dayroom , some interaction with peers. Reports persistent/intermittent nightmares Patient is currently on Depakote, paliperidone, trazodone as needed.  Denies side effects. Valproic acid serum level within therapeutic range (81) on 12/25.  Lipid panel remarkable for low HDL and hypertriglyceridemia at 305.  Hemoglobin A1c 4.6    Principal Problem: Homicidal thoughts Diagnosis: Principal Problem:   Homicidal thoughts Active Problems:   Agitation   Bipolar affective disorder, current episode mixed (HCC)   MDD (major depressive disorder)  Total Time spent with patient: 15 minutes  Past Psychiatric History: See admission H&P  Past Medical History:  Past Medical History:  Diagnosis Date  . ADHD (attention deficit hyperactivity disorder)   . Bipolar disorder ([place])   . Chronic back pain   . Seizures ([place])     Past Surgical History:   Procedure Laterality Date  . THROAT SURGERY     throat surgery when younger doesn't know why   Family History:  Family History  Problem Relation Age of Onset  . Dementia Neg Hx    Family Psychiatric  History: See admission H&P Social History:  Social History   Substance and Sexual Activity  Alcohol Use No  . Alcohol/week: 0.0 standard drinks     Social History   Substance and Sexual Activity  Drug Use No    Social History   Socioeconomic History  . Marital status: Single    Spouse name: Not on file  . Number of children: Not on file  . Years of education: GED  . Highest education level: Not on file  Occupational History  . Not on file  Tobacco Use  . Smoking status: Current Some Day Smoker    Packs/day: 0.25    Years: 4.00    Pack years: 1.00    Types: Cigarettes  . Smokeless tobacco: Never Used  . Tobacco comment: pt says last smoked was 2 yrs ago  Substance and Sexual Activity  . Alcohol use: No    Alcohol/week: 0.0 standard drinks  . Drug use: No  . Sexual activity: Not Currently    Birth control/protection: Other-see comments    Comment: pt says she gets Depo shot  Other Topics Concern  . Not on file  Social History Narrative   Lives at other Residents and staff of group home   Caffeine use: Drinks soda rarely    Social Determinants of Health   Financial Resource Strain:   . Difficulty of Paying Living Expenses: Not on file  Food Insecurity:   .  Worried About Charity fundraiser in the Last Year: Not on file  . Ran Out of Food in the Last Year: Not on file  Transportation Needs:   . Lack of Transportation (Medical): Not on file  . Lack of Transportation (Non-Medical): Not on file  Physical Activity:   . Days of Exercise per Week: Not on file  . Minutes of Exercise per Session: Not on file  Stress:   . Feeling of Stress : Not on file  Social Connections:   . Frequency of Communication with Friends and Family: Not on file  . Frequency of  Social Gatherings with Friends and Family: Not on file  . Attends Religious Services: Not on file  . Active Member of Clubs or Organizations: Not on file  . Attends Archivist Meetings: Not on file  . Marital Status: Not on file   Additional Social History:    Pain Medications: Denies abuse Prescriptions: Denies abuse Over the Counter: Denies abuse History of alcohol / drug use?: Yes(Pt has history of marijuana use. Denies recent use.) Longest period of sobriety (when/how long): Unknown  Sleep: Fair/improving  Appetite:  Good  Current Medications: Current Facility-Administered Medications  Medication Dose Route Frequency Provider Last Rate Last Admin  . acetaminophen (TYLENOL) tablet 650 mg  650 mg Oral Q6H PRN Deloria Lair, NP   650 mg at 10/27/19 1823  . alum & mag hydroxide-simeth (MAALOX/MYLANTA) 200-200-20 MG/5ML suspension 30 mL  30 mL Oral Q4H PRN Dixon, Rashaun M, NP      . divalproex (DEPAKOTE) DR tablet 500 mg  500 mg Oral Q12H Anike, Adaku C, NP   500 mg at 10/28/19 0941  . hydrOXYzine (ATARAX/VISTARIL) tablet 25 mg  25 mg Oral TID PRN Deloria Lair, NP   25 mg at 10/26/19 1819  . Iloperidone TABS 4 mg  4 mg Oral TID Connye Burkitt, NP   4 mg at 10/28/19 1215  . levothyroxine (SYNTHROID) tablet 25 mcg  25 mcg Oral Q0600 Anike, Adaku C, NP   25 mcg at 10/28/19 0656  . magnesium hydroxide (MILK OF MAGNESIA) suspension 30 mL  30 mL Oral Daily PRN Deloria Lair, NP   30 mL at 10/27/19 2011  . nicotine polacrilex (NICORETTE) gum 2 mg  2 mg Oral PRN Kippy Melena, Myer Peer, MD   2 mg at 10/27/19 1227  . traZODone (DESYREL) tablet 200 mg  200 mg Oral QHS PRN Johnn Hai, MD   200 mg at 10/27/19 2152    Lab Results:  Results for orders placed or performed during the hospital encounter of 10/25/19 (from the past 48 hour(s))  Comprehensive metabolic panel     Status: Abnormal   Collection Time: 10/26/19  6:12 PM  Result Value Ref Range   Sodium 139 135 - 145  mmol/L   Potassium 3.9 3.5 - 5.1 mmol/L   Chloride 107 98 - 111 mmol/L   CO2 21 (L) 22 - 32 mmol/L   Glucose, Bld 121 (H) 70 - 99 mg/dL   BUN 11 6 - 20 mg/dL   Creatinine, Ser 0.81 0.44 - 1.00 mg/dL   Calcium 8.9 8.9 - 10.3 mg/dL   Total Protein 7.2 6.5 - 8.1 g/dL   Albumin 3.6 3.5 - 5.0 g/dL   AST 17 15 - 41 U/L   ALT 25 0 - 44 U/L   Alkaline Phosphatase 103 38 - 126 U/L   Total Bilirubin 0.3 0.3 - 1.2 mg/dL  GFR calc non Af Amer >60 >60 mL/min   GFR calc Af Amer >60 >60 mL/min   Anion gap 11 5 - 15    Comment: Performed at [facility], [place] [address], [place], [place] [address]  Hemoglobin A1c     Status: Abnormal   Collection Time: 10/26/19  6:12 PM  Result Value Ref Range   Hgb A1c MFr Bld 4.6 (L) 4.8 - 5.6 %    Comment: (NOTE) Pre diabetes:          5.7%-6.4% Diabetes:              >6.4% Glycemic control for   <7.0% adults with diabetes    Mean Plasma Glucose 85.32 mg/dL    Comment: Performed at [facility] [address], [place], [place] [address]  Magnesium     Status: None   Collection Time: 10/26/19  6:12 PM  Result Value Ref Range   Magnesium 2.1 1.7 - 2.4 mg/dL    Comment: Performed at [facility], [place] [address] [place], [place] [address]  Ethanol     Status: None   Collection Time: 10/26/19  6:12 PM  Result Value Ref Range   Alcohol, Ethyl (B) <10 <10 mg/dL    Comment: (NOTE) Lowest detectable limit for serum alcohol is 10 mg/dL. For medical purposes only. Performed at [facility], [place] [address], [place], [place] [address]   Lipid panel     Status: Abnormal   Collection Time: 10/26/19  6:12 PM  Result Value Ref Range   Cholesterol 200 0 - 200 mg/dL   Triglycerides 305 (H) <150 mg/dL   HDL 33 (L) >40 mg/dL   Total CHOL/HDL Ratio 6.1 RATIO   VLDL 61 (H) 0 - 40 mg/dL   LDL Cholesterol 106 (H) 0 - 99 mg/dL    Comment:        Total Cholesterol/HDL:CHD Risk Coronary Heart Disease  Risk Table                     Men   Women  1/2 Average Risk   3.4   3.3  Average Risk       5.0   4.4  2 X Average Risk   9.6   7.1  3 X Average Risk  23.4   11.0        Use the calculated Patient Ratio above and the CHD Risk Table to determine the patient's CHD Risk.        ATP III CLASSIFICATION (LDL):  <100     mg/dL   Optimal  100-129  mg/dL   Near or Above                    Optimal  130-159  mg/dL   Borderline  160-189  mg/dL   High  >190     mg/dL   Very High Performed at [place] [address], [place], [place] [address]   Hepatic function panel     Status: None   Collection Time: 10/26/19  6:12 PM  Result Value Ref Range   Total Protein 7.3 6.5 - 8.1 g/dL   Albumin 3.5 3.5 - 5.0 g/dL   AST 15 15 - 41 U/L   ALT 30 0 - 44 U/L   Alkaline Phosphatase 104 38 - 126 U/L   Total Bilirubin 0.4 0.3 - 1.2 mg/dL   Bilirubin, Direct <0.1 0.0 -  0.2 mg/dL   Indirect Bilirubin NOT CALCULATED 0.3 - 0.9 mg/dL    Comment: Performed at [facility], [place] [address], [place], [place] [address]  TSH     Status: None   Collection Time: 10/26/19  6:12 PM  Result Value Ref Range   TSH 1.159 0.350 - 4.500 uIU/mL    Comment: Performed by a 3rd Generation assay with a functional sensitivity of <=0.01 uIU/mL. Performed at [facility], [place] [address] [place], [place] [address]   Prolactin     Status: Abnormal   Collection Time: 10/26/19  6:12 PM  Result Value Ref Range   Prolactin 111.0 (H) 4.8 - 23.3 ng/mL    Comment: (NOTE) Performed At: [facility] [place], [place] [id number] Rush Farmer MD CM:[id number]   Valproic acid level     Status: None   Collection Time: 10/26/19  6:12 PM  Result Value Ref Range   Valproic Acid Lvl 81 50.0 - 100.0 ug/mL    Comment: Performed at [place], [place] [address] [place], [place] [address]    Blood Alcohol level:  Lab Results   Component Value Date   [facility] <10 10/26/2019   ETH <10 48/11/6551    Metabolic Disorder Labs: Lab Results  Component Value Date   HGBA1C 4.6 (L) 10/26/2019   MPG 85.32 10/26/2019   Lab Results  Component Value Date   PROLACTIN 111.0 (H) 10/26/2019   Lab Results  Component Value Date   CHOL 200 10/26/2019   TRIG 305 (H) 10/26/2019   HDL 33 (L) 10/26/2019   CHOLHDL 6.1 10/26/2019   VLDL 61 (H) 10/26/2019   LDLCALC 106 (H) 10/26/2019   LDLCALC 61 11/15/2014    Physical Findings: AIMS: Facial and Oral Movements Muscles of Facial Expression: None, normal Lips and Perioral Area: None, normal Jaw: None, normal Tongue: None, normal,Extremity Movements Upper (arms, wrists, hands, fingers): None, normal Lower (legs, knees, ankles, toes): None, normal, Trunk Movements Neck, shoulders, hips: None, normal, Overall Severity Severity of abnormal movements (highest score from questions above): None, normal Incapacitation due to abnormal movements: None, normal Patient's awareness of abnormal movements (rate only patient's report): No Awareness, Dental Status Current problems with teeth and/or dentures?: No Does patient usually wear dentures?: No  CIWA:  CIWA-Ar Total: 7 COWS:  COWS Total Score: 3  Musculoskeletal: Strength & Muscle Tone: within normal limits Gait & Station: normal Patient leans: N/A  Psychiatric Specialty Exam: Physical Exam  Nursing note and vitals reviewed. Constitutional: She is oriented to person, place, and time. She appears well-developed and well-nourished.  Cardiovascular: Normal rate.  Respiratory: Effort normal.  Neurological: She is alert and oriented to person, place, and time.    Review of Systems  Constitutional: Negative.   Respiratory: Negative for cough and shortness of breath.   Psychiatric/Behavioral: Negative for agitation, behavioral problems, decreased concentration, dysphoric mood, hallucinations, self-injury, sleep disturbance and  suicidal ideas. The patient is not nervous/anxious.   No chest pain, no shortness of breath, no vomiting  Blood pressure 108/83, pulse (!) 104, temperature 98 F (36.7 C), temperature source Oral, resp. rate 16, height '4\' 9"'  (1.448 m), weight 81.2 kg, SpO2 99 %.Body mass index is 38.74 kg/m.  General Appearance: Fairly Groomed  Eye Contact:  Good  Speech:  Normal Rate  Volume:  Normal  Mood:  Reports mood as "okay".  Currently does not report significant depression  Affect:  Vaguely guarded, appropriate  Thought Process:  Linear and Descriptions of Associations: Intact  Orientation:  Other:  Alert and attentive  Thought Content: Denies hallucinations, no delusions are currently expressed, tends to ruminate about events at group home.  Currently future oriented  Suicidal Thoughts:  No denies suicidal ideations, denies homicidal or violent ideations and specifically denies any violent or homicidal ideations towards group home staff member  Homicidal Thoughts:  No  Memory:  Recent and remote fair  Judgement:  Fair  Insight:  Fair  Psychomotor Activity:  Normal-no psychomotor agitation or restlessness  Concentration:  Concentration: Fair and Attention Span: Poor  Recall:  [employer] of Knowledge:  Fair  Language:  Fair  Akathisia:  No  Handed:  Right  AIMS (if indicated):     Assets:  Communication Skills Desire for Improvement Housing Resilience  ADL's:  Intact  Cognition:  WNL  Sleep:  Number of Hours: 4.75   Assessment:  28 year old female, group home resident, history of prior diagnoses of schizoaffective disorder, PTSD, borderline personality traits.  Presented due to homicidal ideations towards a group home staff member.  (It is noted she has been living at this group home for several years).  Today remains ruminative about group home related issues but is expressing desire to return there after discharge from unit.  Currently denies suicidal or homicidal ideations and today  denies HI towards anybody, and specifically any HI towards group home staff.  She is currently on Depakote, iloperidone, trazodone, denies side effects  Treatment Plan Summary: Daily contact with patient to assess and evaluate symptoms and progress in treatment and Medication management  Treatment plan reviewed as below today 12/27 Encourage group and milieu participation Continue Depakote 500 mg PO BID for mood disorder Continue Fanapt 4 mg PO TID for mood disorder Continue Synthroid 25 mcg PO daily for hypothyroidism Continue Vistaril 25 mg PO TID PRN anxiety Continue Trazodone 200 mg PO QHS PRN insomnia Treatment team working on discharge planning options Check EKG - routine to monitor QTc interval Jenne Campus, MD 10/28/2019, 1:14 PM   Patient ID: Allayne Gitelman, female   DOB: 1991-07-13, 28 y.o.   MRN: [id number]

## 2019-10-28 NOTE — Progress Notes (Signed)
  [facility] Group Notes:  (Nursing/MHT/Case Management/Adjunct)  Date:  10/28/2019  Time:  1300  Type of Therapy:  Nurse Education Discussed healthy support systems and created collages.   Participation Level:  Did Not Attend   Tami [name] L  

## 2019-10-29 MED ORDER — IBUPROFEN 400 MG PO TABS
400.0000 mg | ORAL_TABLET | Freq: Four times a day (QID) | ORAL | Status: DC | PRN
  Administered 2019-10-29: 400 mg via ORAL
  Filled 2019-10-29: qty 1

## 2019-10-29 NOTE — [facility] Counselor (Signed)
CSW attempted to reach Group Home Contact: Hedda Slade [phone]. The voicemail did not indicate the owner's name or a group home affiliation. CSW left a HIPAA compliant voicemail with callback information.  Stephanie Acre, MSW, [place] Social Worker [facility]  [phone]

## 2019-10-29 NOTE — Progress Notes (Signed)
CSW spoke with a representative from the patient's group home representative,[name] Moshe Cipro [phone]). Per [name], the patient can return to her group home placement at discharge.   [name] asked to be updated on a discharge date once that information was available.   CSW will continue to follow for an appropriate discharge.    Radonna Ricker, MSW, LCSW Clinical Social Worker [facility]  Phone: [phone]

## 2019-10-29 NOTE — Progress Notes (Signed)
D:  Patient denied SI and HI, contracts for safety.  Denied A/V hallucinations.  Denied pain. A:  Medications administered per MD orders.  Emotional support and encouragement given patient. R:  Safety maintained with 15 minute checks.  

## 2019-10-29 NOTE — Plan of Care (Signed)
Nurse discussed anxiety, depression and coping skills with patient.  

## 2019-10-29 NOTE — Progress Notes (Signed)
   10/29/19 2304  Psych Admission Type (Psych Patients Only)  Admission Status Voluntary  Psychosocial Assessment  Patient Complaints Anxiety  Eye Contact Fair  Facial Expression Anxious  Affect Anxious  Speech Logical/coherent  Interaction Assertive  Motor Activity Other (Comment) (steady gait)  Appearance/Hygiene Unremarkable  Behavior Characteristics Cooperative;Anxious  Mood Depressed  Thought Process  Coherency WDL  Content WDL  Delusions None reported or observed  Perception WDL  Hallucination None reported or observed  Judgment Poor  Confusion None  Danger to Self  Current suicidal ideation? Denies  Danger to Others  Danger to Others None reported or observed  Danger to Others Abnormal  Harmful Behavior to others No threats or harm toward other people  Destructive Behavior No threats or harm toward property  D: Patient in dayroom reports she had a good day. Pt report her goal is to continue to focus on treatment and discharge. A: Medications administered as prescribed. Support and encouragement provided as needed.  R: Patient remains safe on the unit. Will continue to monitor for safety and stability.

## 2019-10-29 NOTE — [facility] Group Notes (Signed)
LCSW Group Therapy Notes  Type of Therapy and Topic: Group Therapy: Core Beliefs  Participation Level: Active  Description of Group: In this group patients will be encouraged to explore their negative and positive core beliefs about themselves, others, and the world. Each patient will be challenged to identify these beliefs and ways to challenge negative core beliefs. This group will be process-oriented, with patients participating in exploration of their own experiences as well as giving and receiving support and challenge from other group members.  Therapeutic Goals: 1. Patient will identify personal core beliefs, both negative and positive. 2. Patient will identify core beliefs relating to others, both negative and positive. 3. Patient will challenge their negative beliefs about themselves and others. 4. Patient will identify three changes they can make to replace negative core beliefs with positive beliefs.  Summary of Patient Progress  Due to the COVID-19 pandemic, this group has been supplemented with worksheets.  Therapeutic Modalities: Cognitive Behavioral Therapy Solution Focused Therapy Motivational Interviewing  

## 2019-10-29 NOTE — Progress Notes (Signed)
Recreation Therapy Notes  Date:  12.2820 Time: 0930 Location: 300 Hall Dayroom  Group Topic: Stress Management  Goal Area(s) Addresses:  Patient will identify positive stress management techniques. Patient will identify benefits of using stress management post d/c.  Behavioral Response:  Engaged  Intervention:  Stress Management  Activity :  Meditation.  LRT played a meditation the focused on pure possibility.  The meditation lead patients to focus on something they want to accomplish during they day.  Patients were to listen to the meditation and follow along as the meditation played to engage.  Education:  Stress Management, Discharge Planning.   Education Outcome: Acknowledges Education  Clinical Observations/Feedback:  Pt attended and participated in activity.    Victorino Sparrow, LRT/CTRS    Ria Comment, Khaliel Morey A 10/29/2019 11:30 AM

## 2019-10-29 NOTE — [facility] Suicide Risk Assessment (Signed)
[place] INPATIENT:  Family/Significant Other Suicide Prevention Education  Suicide Prevention Education:  Education Completed;  guardian, Antony Blackbird - [phone] has been identified by the patient as the family member/significant other with whom the patient will be residing, and identified as the person(s) who will aid the patient in the event of a mental health crisis (suicidal ideations/suicide attempt).  With written consent from the patient, the family member/significant other has been provided the following suicide prevention education, prior to the and/or following the discharge of the patient.  The suicide prevention education provided includes the following:  Suicide risk factors  Suicide prevention and interventions  National Suicide Hotline telephone number  [facility] assessment telephone number  [facility] Emergency Assistance [place] and/or Residential Mobile Crisis Unit telephone number  Request made of family/significant other to:  Remove weapons (e.g., guns, rifles, knives), all items previously/currently identified as safety concern.    Remove drugs/medications (over-the-counter, prescriptions, illicit drugs), all items previously/currently identified as a safety concern.  The family member/significant other verbalizes understanding of the suicide prevention education information provided.  The family member/significant other agrees to remove the items of safety concern listed above.   Legal guardian reports that she has spoken with Hedda Slade, an administrator at the patient's group home, and confirms that Nesbitt is able to return to her group home at discharge. Legal guardian reports she will ask group home staff to follow up with CSW to coordinate discharge plans.  Legal guardian expressed no concerns for discharging back to ger group home. Call back information was provided, should additional concerns arise.   Joellen [place] 10/29/2019, 10:31 AM

## 2019-10-29 NOTE — Progress Notes (Addendum)
[facility] MD Progress Note  10/29/2019 9:21 AM Tami [name]  MRN:  [id number] Subjective:  "I'm good."  Tami [name] lying in bed. She presents with euthymic affect and reports stable mood. She feels mood swings have decreased with current medication regimen. She denies irritability or agitation. Denies anxiety. She denies SI/HI and specifically denies HI toward group home staff members. Denies AVH. She complains of difficulty sleeping overnight but believes this is related to being in an uncomfortable bed, and she usually sleeps well at home. She is agreeable to returning to the group home at discharge. She has been visible and interacting appropriately on the unit, participating in groups. No agitated or disruptive behaviors on the unit.  From admission H&P: This is the latest of multiple admissions and healthcare system encounters for Tami [name], the patient reports she has been hospitalized at least 6 times. 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 Her initial complaint was homicidal thoughts towards group home staff and her aunt.  Principal Problem: Homicidal thoughts Diagnosis: Principal Problem:   Homicidal thoughts Active Problems:   Agitation   Bipolar affective disorder, current episode mixed (HCC)   MDD (major depressive disorder)  Total Time spent with patient: 15 minutes  Past Psychiatric History: See admission H&P  Past Medical History:  Past Medical History:  Diagnosis Date  . ADHD (attention deficit hyperactivity disorder)   . Bipolar disorder ([place])   . Chronic back pain   . Seizures ([place])     Past Surgical History:  Procedure Laterality Date  . THROAT SURGERY     throat surgery when younger doesn't know why   Family History:  Family History  Problem Relation Age of Onset  . Dementia Neg Hx    Family Psychiatric  History: See  admission H&P Social History:  Social History   Substance and Sexual Activity  Alcohol Use No  . Alcohol/week: 0.0 standard drinks     Social History   Substance and Sexual Activity  Drug Use No    Social History   Socioeconomic History  . Marital status: Single    Spouse name: Not on file  . Number of children: Not on file  . Years of education: GED  . Highest education level: Not on file  Occupational History  . Not on file  Tobacco Use  . Smoking status: Current Some Day Smoker    Packs/day: 0.25    Years: 4.00    Pack years: 1.00    Types: Cigarettes  . Smokeless tobacco: Never Used  . Tobacco comment: pt says last smoked was 2 yrs ago  Substance and Sexual Activity  . Alcohol use: No    Alcohol/week: 0.0 standard drinks  . Drug use: No  . Sexual activity: Not Currently    Birth control/protection: Other-see comments    Comment: pt says she gets Depo shot  Other Topics Concern  . Not on file  Social History Narrative   Lives at other Residents and staff of group home   Caffeine use: Drinks soda rarely    Social Determinants of Health   Financial Resource Strain:   . Difficulty of Paying Living Expenses: Not on file  Food Insecurity:   . Worried About Charity fundraiser in the Last Year: Not on file  . Ran Out of Food in the Last Year: Not on file  Transportation Needs:   . Lack of Transportation (  Medical): Not on file  . Lack of Transportation (Non-Medical): Not on file  Physical Activity:   . Days of Exercise per Week: Not on file  . Minutes of Exercise per Session: Not on file  Stress:   . Feeling of Stress : Not on file  Social Connections:   . Frequency of Communication with Friends and Family: Not on file  . Frequency of Social Gatherings with Friends and Family: Not on file  . Attends Religious Services: Not on file  . Active Member of Clubs or Organizations: Not on file  . Attends Banker Meetings: Not on file  . Marital  Status: Not on file   Additional Social History:    Pain Medications: Denies abuse Prescriptions: Denies abuse Over the Counter: Denies abuse History of alcohol / drug use?: Yes(Pt has history of marijuana use. Denies recent use.) Longest period of sobriety (when/how long): Unknown                    Sleep: Fair  Appetite:  Good  Current Medications: Current Facility-Administered Medications  Medication Dose Route Frequency Provider Last Rate Last Admin  . acetaminophen (TYLENOL) tablet 650 mg  650 mg Oral Q6H PRN Jearld Lesch, NP   650 mg at 10/29/19 0750  . alum & mag hydroxide-simeth (MAALOX/MYLANTA) 200-200-20 MG/5ML suspension 30 mL  30 mL Oral Q4H PRN Dixon, Rashaun M, NP      . divalproex (DEPAKOTE) DR tablet 500 mg  500 mg Oral Q12H Anike, Adaku C, NP   500 mg at 10/29/19 0749  . hydrOXYzine (ATARAX/VISTARIL) tablet 25 mg  25 mg Oral TID PRN Jearld Lesch, NP   25 mg at 10/29/19 0751  . Iloperidone TABS 4 mg  4 mg Oral TID Aldean Baker, NP   4 mg at 10/29/19 0749  . levothyroxine (SYNTHROID) tablet 25 mcg  25 mcg Oral Q0600 Anike, Adaku C, NP   25 mcg at 10/29/19 0700  . magnesium hydroxide (MILK OF MAGNESIA) suspension 30 mL  30 mL Oral Daily PRN Jearld Lesch, NP   30 mL at 10/27/19 2011  . nicotine polacrilex (NICORETTE) gum 2 mg  2 mg Oral PRN Noa Constante, Rockey Situ, MD   2 mg at 10/27/19 1227  . traZODone (DESYREL) tablet 200 mg  200 mg Oral QHS PRN Malvin Johns, MD   200 mg at 10/28/19 2304    Lab Results: No results found for this or any previous visit (from the past 48 hour(s)).  Blood Alcohol level:  Lab Results  Component Value Date   ETH <10 10/26/2019   ETH <10 07/27/2017    Metabolic Disorder Labs: Lab Results  Component Value Date   HGBA1C 4.6 (L) 10/26/2019   MPG 85.32 10/26/2019   Lab Results  Component Value Date   PROLACTIN 111.0 (H) 10/26/2019   Lab Results  Component Value Date   CHOL 200 10/26/2019   TRIG 305 (H)  10/26/2019   HDL 33 (L) 10/26/2019   CHOLHDL 6.1 10/26/2019   VLDL 61 (H) 10/26/2019   LDLCALC 106 (H) 10/26/2019   LDLCALC 61 11/15/2014    Physical Findings: AIMS: Facial and Oral Movements Muscles of Facial Expression: None, normal Lips and Perioral Area: None, normal Jaw: None, normal Tongue: None, normal,Extremity Movements Upper (arms, wrists, hands, fingers): None, normal Lower (legs, knees, ankles, toes): None, normal, Trunk Movements Neck, shoulders, hips: None, normal, Overall Severity Severity of abnormal movements (highest score from questions  above): None, normal Incapacitation due to abnormal movements: None, normal Patient's awareness of abnormal movements (rate only patient's report): No Awareness, Dental Status Current problems with teeth and/or dentures?: No Does patient usually wear dentures?: No  CIWA:  CIWA-Ar Total: 7 COWS:  COWS Total Score: 3  Musculoskeletal: Strength & Muscle Tone: within normal limits Gait & Station: normal Patient leans: N/A  Psychiatric Specialty Exam: Physical Exam  Nursing note and vitals reviewed. Constitutional: She is oriented to person, place, and time. She appears well-developed and well-nourished.  Respiratory: Effort normal.  Musculoskeletal:        General: Normal range of motion.  Neurological: She is alert and oriented to person, place, and time.    Review of Systems  Constitutional: Negative.   Respiratory: Negative for cough and shortness of breath.   Gastrointestinal: Negative for diarrhea, nausea and vomiting.  Neurological: Negative for headaches.  Psychiatric/Behavioral: Negative for agitation, behavioral problems, dysphoric mood, hallucinations, self-injury, sleep disturbance and suicidal ideas. The patient is not nervous/anxious and is not hyperactive.     Blood pressure 107/68, pulse (!) 122, temperature 98.1 F (36.7 C), resp. rate 16, height 4\' 9"  (1.448 m), weight 81.2 kg, SpO2 99 %.Body mass index is  38.74 kg/m.  General Appearance: Fairly Groomed  Eye Contact:  Fair  Speech:  Normal Rate  Volume:  Normal  Mood:  Euthymic  Affect:  Constricted  Thought Process:  Coherent  Orientation:  Full (Time, Place, and Person)  Thought Content:  Logical  Suicidal Thoughts:  No  Homicidal Thoughts:  No  Memory:  Immediate;   Fair Recent;   Fair Remote;   Fair  Judgement:  Intact  Insight:  Fair  Psychomotor Activity:  Normal  Concentration:  Concentration: Fair and Attention Span: Poor  Recall:  [employer]Fair  Fund of Knowledge:  Fair  Language:  Fair  Akathisia:  No  Handed:  Right  AIMS (if indicated):     Assets:  Communication Skills Desire for Improvement Housing Leisure Time Resilience Social Support  ADL's:  Intact  Cognition:  WNL  Sleep:  Number of Hours: 4     Treatment Plan Summary: Daily contact with patient to assess and evaluate symptoms and progress in treatment and Medication management   Continue inpatient hospitalization.  Continue Depakote 500 mg PO BID for mood instability Continue Fanapt 4 mg PO TID for mood instability Continue Synthroid 25 mcg PO daily for hypothyroidism Continue Vistaril 25 mg PO TID PRN anxiety Continue trazodone 200 mg PO QHS PRN insomnia  Patient will participate in the therapeutic group milieu.  Discharge disposition in progress.   Aldean BakerJanet E Sykes, NP 10/29/2019, 9:21 AM   Attest to NP Note

## 2019-10-29 NOTE — BHH Counselor (Signed)
CSW attempted to reach group home staff, Hedda Slade [phone]) a second time.  Patient legal guardian reported earlier that patient is able to return to her group home.  CSW following for a safe disposition and discharge plan.  Stephanie Acre, MSW, [place] Social Worker [facility]  [phone]

## 2019-10-30 MED ORDER — DIVALPROEX SODIUM 500 MG PO DR TAB: 500.0000 mg | DELAYED_RELEASE_TABLET | Freq: Two times a day (BID) | ORAL | 0 refills | Status: AC

## 2019-10-30 MED ORDER — LEVOTHYROXINE SODIUM 25 MCG PO TABS: 25.0000 ug | ORAL_TABLET | Freq: Every day | ORAL | 0 refills | Status: AC

## 2019-10-30 MED ORDER — ONDANSETRON 4 MG PO TBDP: 4.0000 mg | ORAL_TABLET | Freq: Once | ORAL | Status: DC

## 2019-10-30 MED ORDER — HYDROXYZINE HCL 25 MG PO TABS: 25.0000 mg | ORAL_TABLET | Freq: Three times a day (TID) | ORAL | 0 refills | Status: AC | PRN

## 2019-10-30 MED ORDER — NICOTINE POLACRILEX 2 MG MT GUM: 2.0000 mg | CHEWING_GUM | OROMUCOSAL | 0 refills | Status: DC | PRN

## 2019-10-30 MED ORDER — ILOPERIDONE 2 MG PO TABS: 4.0000 mg | ORAL_TABLET | Freq: Three times a day (TID) | ORAL | 0 refills | Status: DC

## 2019-10-30 MED ORDER — TRAZODONE HCL 100 MG PO TABS: 200.0000 mg | ORAL_TABLET | Freq: Every evening | ORAL | 0 refills | Status: DC | PRN
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
## 2019-10-30 NOTE — B[facility] Suicide Risk Assessment (Signed)
[facility] Discharge Suicide Risk Assessment   Principal Problem: Homicidal thoughts Discharge Diagnoses: Principal Problem:   Homicidal thoughts Active Problems:   Agitation   Bipolar affective disorder, current episode mixed (HCC)   MDD (major depressive disorder)   Total Time spent with patient: 20 minutes  Musculoskeletal: Strength & Muscle Tone: within normal limits Gait & Station: normal Patient leans: N/A  Psychiatric Specialty Exam: Review of Systems  All other systems reviewed and are negative.   Blood pressure 133/85, pulse (!) 114, temperature 98.5 F (36.9 C), temperature source Oral, resp. rate 16, height 4\' 9"  (1.448 m), weight 81.2 kg, SpO2 99 %.Body mass index is 38.74 kg/m.  General Appearance: Casual  Eye Contact::  Fair  Speech:  Normal Rate409  Volume:  Normal  Mood:  Euthymic  Affect:  Congruent  Thought Process:  Coherent and Descriptions of Associations: Circumstantial  Orientation:  Full (Time, Place, and Person)  Thought Content:  Logical  Suicidal Thoughts:  No  Homicidal Thoughts:  No  Memory:  Immediate;   Fair Recent;   Fair Remote;   Fair  Judgement:  Intact  Insight:  Fair  Psychomotor Activity:  Normal  Concentration:  Fair  Recall:  [employer] of Knowledge:Fair  Language: Fair  Akathisia:  Negative  Handed:  Right  AIMS (if indicated):     Assets:  Desire for Improvement Resilience  Sleep:  Number of Hours: 4.75  Cognition: WNL  ADL's:  Intact   Mental Status Per Nursing Assessment::   On Admission:  NA  Demographic Factors:  Caucasian, Low socioeconomic status and Unemployed  Loss Factors: NA  Historical Factors: Impulsivity  Risk Reduction Factors:   Living with another person, especially a relative  Continued Clinical Symptoms:  Bipolar Disorder:   Depressive phase  Cognitive Features That Contribute To Risk:  None    Suicide Risk:  Minimal: No identifiable suicidal ideation.  Patients presenting with no risk  factors but with morbid ruminations; may be classified as minimal risk based on the severity of the depressive symptoms  Follow-up Information    [facility] Follow up on 11/08/2019.   Why: Your hospital discharge appointment will be held by phone on 01/07 at 9:30am. The provider will call you. Please have access to your hospital discharge paperwork. Contact information: [address] [place] [address] [phone]           Plan Of Care/Follow-up recommendations:  Activity:  ad lib  Sharma Covert, MD 10/30/2019, 10:09 AM

## 2019-10-30 NOTE — Progress Notes (Signed)
Pt d/c from the hospital to group home representative. All items returned. D/C instructions given and prescriptions given. Pt denies si and hi.

## 2019-10-30 NOTE — Progress Notes (Signed)
  [facility] Adult Case Management Discharge Plan :  Will you be returning to the same living situation after discharge:  Yes,  returning to group home. At discharge, do you have transportation home?: Yes,  group home staff picking up after 3pm. Do you have the ability to pay for your medications: Yes,  Medicaid.  Release of information consent forms completed and in the chart. Guardian notified of discharge.  Patient to Follow up at: Follow-up Information    [facility] Follow up on 11/08/2019.   Why: Your hospital discharge appointment will be held by phone on 01/07 at 9:30am. The provider will call you. Please have access to your hospital discharge paperwork. Contact information: [place] [id number] [phone]           Next level of care provider has access to [place] and Suicide Prevention discussed: Yes,  with guardian and group home staff.  Have you used any form of tobacco in the last 30 days? (Cigarettes, Smokeless Tobacco, Cigars, and/or Pipes): No  Has patient been referred to the Quitline?: N/A patient is not a smoker  Patient has been referred for addiction treatment: Yes  Joellen [place], [place] 10/30/2019, 10:04 AM

## 2019-10-30 NOTE — BHH Counselor (Signed)
CSW spoke with [place], Hedda Slade [phone]) to confirm discharge. Patient is able to return to her group home and staff will provide transportation after 3pm today.   CSW spoke with legal guardian, guardian, Antony Blackbird [phone]). Legal guardian is aware of discharge and is agreeable to patient returning to her group home and following up with [facility].  Legal guardian granted verbal permission for CSW and witness to sign ROI.  Stephanie Acre, MSW, [place] Social Worker [facility]  [phone]

## 2020-05-06 ENCOUNTER — Other Ambulatory Visit: Payer: Self-pay

## 2020-05-06 ENCOUNTER — Encounter (HOSPITAL_COMMUNITY): Payer: Self-pay

## 2020-05-06 ENCOUNTER — Ambulatory Visit (HOSPITAL_COMMUNITY)
Admission: EM | Admit: 2020-05-06 | Discharge: 2020-05-07 | Disposition: A | Discharge status: Home / Self Care | Payer: Medicaid Other | Attending: Behavioral Health | Admitting: Behavioral Health

## 2020-05-06 DIAGNOSIS — R45851 Suicidal ideations: Secondary | ICD-10-CM | POA: Insufficient documentation

## 2020-05-06 DIAGNOSIS — Z79899 Other long term (current) drug therapy: Secondary | ICD-10-CM | POA: Insufficient documentation

## 2020-05-06 DIAGNOSIS — F3161 Bipolar disorder, current episode mixed, mild: Secondary | ICD-10-CM | POA: Insufficient documentation

## 2020-05-06 DIAGNOSIS — F22 Delusional disorders: Secondary | ICD-10-CM | POA: Diagnosis not present

## 2020-05-06 DIAGNOSIS — F909 Attention-deficit hyperactivity disorder, unspecified type: Secondary | ICD-10-CM | POA: Diagnosis not present

## 2020-05-06 DIAGNOSIS — F17210 Nicotine dependence, cigarettes, uncomplicated: Secondary | ICD-10-CM | POA: Insufficient documentation

## 2020-05-06 DIAGNOSIS — Z791 Long term (current) use of non-steroidal anti-inflammatories (NSAID): Secondary | ICD-10-CM | POA: Insufficient documentation

## 2020-05-06 DIAGNOSIS — Z793 Long term (current) use of hormonal contraceptives: Secondary | ICD-10-CM | POA: Insufficient documentation

## 2020-05-06 DIAGNOSIS — Z20822 Contact with and (suspected) exposure to covid-19: Secondary | ICD-10-CM | POA: Insufficient documentation

## 2020-05-06 LAB — POCT URINALYSIS DIP (DEVICE)
Bilirubin Urine: NEGATIVE
Glucose, UA: NEGATIVE mg/dL
Ketones, ur: 40 mg/dL — AB
Nitrite: NEGATIVE
Protein, ur: NEGATIVE mg/dL
Specific Gravity, Urine: >=1.03 (ref 1.005–1.030)
Urobilinogen, UA: 0.2 mg/dL (ref 0.0–1.0)
pH: 6.5 (ref 5.0–8.0)

## 2020-05-06 LAB — GLUCOSE, CAPILLARY: Glucose-Capillary: 160 mg/dL — ABNORMAL HIGH (ref 70–99)

## 2020-05-06 LAB — POC SARS CORONAVIRUS 2 AG: SARS Coronavirus 2 Ag: NEGATIVE

## 2020-05-06 MED ORDER — ACETAMINOPHEN 325 MG PO TABS: 650.0000 mg | ORAL_TABLET | Freq: Four times a day (QID) | ORAL | Status: DC | PRN

## 2020-05-06 MED ORDER — LEVOTHYROXINE SODIUM 25 MCG PO TABS
25.0000 ug | ORAL_TABLET | Freq: Every day | ORAL | Status: DC
  Administered 2020-05-07: 25 ug via ORAL
  Filled 2020-05-06: qty 1

## 2020-05-06 MED ORDER — NICOTINE POLACRILEX 2 MG MT GUM: 2.0000 mg | CHEWING_GUM | OROMUCOSAL | Status: DC | PRN

## 2020-05-06 MED ORDER — ILOPERIDONE 2 MG PO TABS
4.0000 mg | ORAL_TABLET | Freq: Three times a day (TID) | ORAL | Status: DC
  Administered 2020-05-07 (×2): 4 mg via ORAL
  Filled 2020-05-06 (×6): qty 2

## 2020-05-06 MED ORDER — HYDROXYZINE HCL 25 MG PO TABS
25.0000 mg | ORAL_TABLET | Freq: Three times a day (TID) | ORAL | Status: DC | PRN
  Administered 2020-05-06: 25 mg via ORAL
  Filled 2020-05-06: qty 1

## 2020-05-06 MED ORDER — MAGNESIUM HYDROXIDE 400 MG/5ML PO SUSP: 30.0000 mL | Freq: Every day | ORAL | Status: DC | PRN

## 2020-05-06 MED ORDER — TRAZODONE HCL 100 MG PO TABS
100.0000 mg | ORAL_TABLET | Freq: Every day | ORAL | Status: DC
  Administered 2020-05-06: 100 mg via ORAL
  Filled 2020-05-06: qty 1

## 2020-05-06 MED ORDER — ALUM & MAG HYDROXIDE-SIMETH 200-200-20 MG/5ML PO SUSP: 30.0000 mL | ORAL | Status: DC | PRN

## 2020-05-06 MED ORDER — TRAZODONE HCL 50 MG PO TABS: 50.0000 mg | ORAL_TABLET | Freq: Every evening | ORAL | Status: DC | PRN

## 2020-05-06 MED ORDER — DIVALPROEX SODIUM 500 MG PO DR TAB
500.0000 mg | DELAYED_RELEASE_TABLET | Freq: Two times a day (BID) | ORAL | Status: DC
  Administered 2020-05-06 – 2020-05-07 (×2): 500 mg via ORAL
  Filled 2020-05-06 (×2): qty 1
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
## 2020-05-06 NOTE — ED Notes (Signed)
Pt belongings have been placed in locker number 27.

## 2020-05-06 NOTE — ED Triage Notes (Signed)
Pt states I'm suicidal and the voices are telling me to hurt myself. Pt states I wanted to throw a chair because I'm ill.

## 2020-05-06 NOTE — ED Provider Notes (Signed)
6y7Behavioral Health Admission H&P [facility] & OBS)  Date: 05/06/20 Patient Name: Tami [name] MRN: [id number] Chief Complaint: No chief complaint on file.     Diagnoses:  Final diagnoses:  None    HPI:    Tami [name] a 29 y.o female who presents to urgent care voluntarily as a walk-in for SI. Pt reorts she has been feeling suicidal for 2 weeks. Pt states "I'm hearing voices saying they are going to kill me and I should kill myself, i'm constantly hyper and sometimes depressed". Pt reports the staffer at the group home has been missing giving her medication and she missed her meds on Monday and today. Pt reports a history of SA, last was 2 weeks were she tried to stab herself. Pt reports she is currently suicidal and tried to stab self with a pencil today. She denies HI, SH, visual hallucination and paranoia. Pt reports she used THC last weekend and uses it every time she goes to home visits.     PHQ 2-9:     Admission (Discharged) [facility]  C-SSRS RISK CATEGORY No Risk       Total Time spent with patient: 30 minutes  Musculoskeletal  Strength & Muscle Tone: within normal limits Gait & Station: normal Patient leans: N/A  Psychiatric Specialty Exam  Presentation General Appearance: Casual  Eye Contact:Good  Speech:Normal Rate  Speech Volume:Normal  Handedness:Right   Mood and Affect  Mood:Anxious;Depressed  Affect:Congruent;Depressed   Thought Process  Thought Processes:Coherent  Descriptions of Associations:Intact  Orientation:Full (Time, Place and Person)  Thought Content:Logical  Hallucinations:Hallucinations: Auditory  Ideas of Reference:None  Suicidal Thoughts:Suicidal Thoughts: Yes, Active SI Active Intent and/or Plan: With Plan  Homicidal Thoughts:Homicidal Thoughts: No   Sensorium  Memory:Recent Good  Judgment:Fair  Insight:Fair   Executive Functions   Concentration:Good  Attention Span:No data recorded Recall:Good  Fund of Knowledge:Good  Language:Good   Psychomotor Activity  Psychomotor Activity:Psychomotor Activity: Normal   Assets  Assets:Communication Skills;Desire for Improvement;Housing;Social Support   Sleep  Sleep:Sleep: Fair Number of Hours of Sleep: 6   Physical Exam ROS  Blood pressure 127/85, pulse (!) 109, temperature 98.7 F (37.1 C), temperature source Oral, resp. rate 18, SpO2 99 %. There is no height or weight on file to calculate BMI.  Past Psychiatric History: Bipolar disorde, substance abuse and MDD  Is the patient at risk to self? Yes  Has the patient been a risk to self in the past 6 months? No .    Has the patient been a risk to self within the distant past? No   Is the patient a risk to others? No   Has the patient been a risk to others in the past 6 months? No   Has the patient been a risk to others within the distant past? No   Past Medical History:  Past Medical History:  Diagnosis Date  . ADHD (attention deficit hyperactivity disorder)   . Bipolar disorder (HCC)   . Chronic back pain   . Seizures (HCC)     Past Surgical History:  Procedure Laterality Date  . THROAT SURGERY     throat surgery when younger doesn't know why    Family History:  Family History  Problem Relation Age of Onset  . Dementia Neg Hx     Social History:  Social History   Socioeconomic History  . Marital status: Single    Spouse name: Not on file  .  Number of children: Not on file  . Years of education: GED  . Highest education level: Not on file  Occupational History  . Not on file  Tobacco Use  . Smoking status: Current Some Day Smoker    Packs/day: 0.25    Years: 4.00    Pack years: 1.00    Types: Cigarettes  . Smokeless tobacco: Never Used  . Tobacco comment: pt says last smoked was 2 yrs ago  Substance and Sexual Activity  . Alcohol use: No    Alcohol/week: 0.0 standard drinks  .  Drug use: No  . Sexual activity: Not Currently    Birth control/protection: Other-see comments    Comment: pt says she gets Depo shot  Other Topics Concern  . Not on file  Social History Narrative   Lives at other Residents and staff of group home   Caffeine use: Drinks soda rarely    Social Determinants of Health   Financial Resource Strain:   . Difficulty of Paying Living Expenses:   Food Insecurity:   . Worried About Programme researcher, broadcasting/film/video in the Last Year:   . Barista in the Last Year:   Transportation Needs:   . Freight forwarder (Medical):   Marland Kitchen Lack of Transportation (Non-Medical):   Physical Activity:   . Days of Exercise per Week:   . Minutes of Exercise per Session:   Stress:   . Feeling of Stress :   Social Connections:   . Frequency of Communication with Friends and Family:   . Frequency of Social Gatherings with Friends and Family:   . Attends Religious Services:   . Active Member of Clubs or Organizations:   . Attends Banker Meetings:   Marland Kitchen Marital Status:   Intimate Partner Violence:   . Fear of Current or Ex-Partner:   . Emotionally Abused:   Marland Kitchen Physically Abused:   . Sexually Abused:     SDOH:  SDOH Screenings   Alcohol Screen: Low Risk   . Last Alcohol Screening Score (AUDIT): 0  Depression (PHQ2-9):   . PHQ-2 Score:   Financial Resource Strain:   . Difficulty of Paying Living Expenses:   Food Insecurity:   . Worried About Programme researcher, broadcasting/film/video in the Last Year:   . [employer] of Food in the Last Year:   Housing:   . Last Housing Risk Score:   Physical Activity:   . Days of Exercise per Week:   . Minutes of Exercise per Session:   Social Connections:   . Frequency of Communication with Friends and Family:   . Frequency of Social Gatherings with Friends and Family:   . Attends Religious Services:   . Active Member of Clubs or Organizations:   . Attends Banker Meetings:   Marland Kitchen Marital Status:   Stress:   .  Feeling of Stress :   Tobacco Use: High Risk  . Smoking Tobacco Use: Current Some Day Smoker  . Smokeless Tobacco Use: Never Used  Transportation Needs:   . Freight forwarder (Medical):   Marland Kitchen Lack of Transportation (Non-Medical):     Last Labs:  No visits with results within 6 Month(s) from this visit.  Latest known visit with results is:  Admission on 10/25/2019, Discharged on 10/30/2019  Component Date Value Ref Range Status  . SARS Coronavirus 2 by RT PCR 10/25/2019 NEGATIVE  NEGATIVE Final   Comment: (NOTE) SARS-CoV-2 target nucleic acids are NOT DETECTED. The  SARS-CoV-2 RNA is generally detectable in upper respiratoy specimens during the acute phase of infection. The lowest concentration of SARS-CoV-2 viral copies this assay can detect is 131 copies/mL. A negative result does not preclude SARS-Cov-2 infection and should not be used as the sole basis for treatment or other patient management decisions. A negative result may occur with  improper specimen collection/handling, submission of specimen other than nasopharyngeal swab, presence of viral mutation(s) within the areas targeted by this assay, and inadequate number of viral copies (<131 copies/mL). A negative result must be combined with clinical observations, patient history, and epidemiological information. The expected result is Negative. Fact Sheet for Patients:  https://www.moore.com/https://www.fda.gov/media/142436/download Fact Sheet for Healthcare Providers:  https://www.young.biz/https://www.fda.gov/media/142435/download This test is not yet ap                          proved or cleared by the [place] FDA and  has been authorized for detection and/or diagnosis of SARS-CoV-2 by FDA under an Emergency Use Authorization (EUA). This EUA will remain  in effect (meaning this test can be used) for the duration of the COVID-19 declaration under Section 564(b)(1) of the Act, 21 U.S.C. section 360bbb-3(b)(1), unless the authorization is terminated  or revoked sooner.   . Influenza A by PCR 10/25/2019 NEGATIVE  NEGATIVE Final  . Influenza B by PCR 10/25/2019 NEGATIVE  NEGATIVE Final   Comment: (NOTE) The Xpert Xpress SARS-CoV-2/FLU/RSV assay is intended as an aid in  the diagnosis of influenza from Nasopharyngeal swab specimens and  should not be used as a sole basis for treatment. Nasal washings and  aspirates are unacceptable for Xpert Xpress SARS-CoV-2/FLU/RSV  testing. Fact Sheet for Patients: https://www.moore.com/https://www.fda.gov/media/142436/download Fact Sheet for Healthcare Providers: https://www.young.biz/https://www.fda.gov/media/142435/download This test is not yet approved or cleared by the [place] FDA and  has been authorized for detection and/or diagnosis of SARS-CoV-2 by  FDA under an Emergency Use Authorization (EUA). This EUA will remain  in effect (meaning this test can be used) for the duration of the  Covid-19 declaration under Section 564(b)(1) of the Act, 21  U.S.C. section 360bbb-3(b)(1), unless the authorization is  terminated or revoked. Performed at [facility], [address] [place], [place] [address]   . Sodium 10/26/2019 139  135 - 145 mmol/L Final  . Potassium 10/26/2019 3.9  3.5 - 5.1 mmol/L Final  . Chloride 10/26/2019 107  98 - 111 mmol/L Final  . CO2 10/26/2019 21* 22 - 32 mmol/L Final  . Glucose, Bld 10/26/2019 121* 70 - 99 mg/dL Final  . BUN 81/19/147812/25/2020 11  6 - 20 mg/dL Final  . Creatinine, Ser 10/26/2019 0.81  0.44 - 1.00 mg/dL Final  . Calcium 29/56/213012/25/2020 8.9  8.9 - 10.3 mg/dL Final  . Total Protein 10/26/2019 7.2  6.5 - 8.1 g/dL Final  . Albumin 86/57/846912/25/2020 3.6  3.5 - 5.0 g/dL Final  . AST 62/95/284112/25/2020 17  15 - 41 U/L Final  . ALT 10/26/2019 25  0 - 44 U/L Final  . Alkaline Phosphatase 10/26/2019 103  38 - 126 U/L Final  . Total Bilirubin 10/26/2019 0.3  0.3 - 1.2 mg/dL Final  . GFR calc non Af Amer 10/26/2019 >60  >60 mL/min Final  . GFR calc Af Amer 10/26/2019 >60  >60 mL/min Final  . Anion gap  10/26/2019 11  5 - 15 Final   Performed at [facility], [address] [place], [place] [address]  . Hgb  A1c MFr Bld 10/26/2019 4.6* 4.8 - 5.6 % Final   Comment: (NOTE) Pre diabetes:          5.7%-6.4% Diabetes:              >6.4% Glycemic control for   <7.0% adults with diabetes   . Mean Plasma Glucose 10/26/2019 85.32  mg/dL Final   Performed at [facility], [address], [place], [place] [address]  . Magnesium 10/26/2019 2.1  1.7 - 2.4 mg/dL Final   Performed at [facility], [address], [place], [place] [address]  . Alcohol, Ethyl (B) 10/26/2019 <10  <10 mg/dL Final   Comment: (NOTE) Lowest detectable limit for serum alcohol is 10 mg/dL. For medical purposes only. Performed at [facility], [address], [place], [place] [address]   . Cholesterol 10/26/2019 200  0 - 200 mg/dL Final  . Triglycerides 10/26/2019 305* <150 mg/dL Final  . HDL 40/34/7425 33* >40 mg/dL Final  . Total CHOL/HDL Ratio 10/26/2019 6.1  RATIO Final  . VLDL 10/26/2019 61* 0 - 40 mg/dL Final  . LDL Cholesterol 10/26/2019 106* 0 - 99 mg/dL Final   Comment:        Total Cholesterol/HDL:CHD Risk Coronary Heart Disease Risk Table                     Men   Women  1/2 Average Risk   3.4   3.3  Average Risk       5.0   4.4  2 X Average Risk   9.6   7.1  3 X Average Risk  23.4   11.0        Use the calculated Patient Ratio above and the CHD Risk Table to determine the patient's CHD Risk.        ATP III CLASSIFICATION (LDL):  <100     mg/dL   Optimal  956-387  mg/dL   Near or Above                    Optimal  130-159  mg/dL   Borderline  564-332  mg/dL   High  >951     mg/dL   Very High Performed at [facility], [address] [place], [place] [address]   . Total Protein 10/26/2019 7.3  6.5 - 8.1 g/dL Final  . Albumin 60/63/0160 3.5  3.5 - 5.0 g/dL Final  . AST 10/93/2355 15  15 - 41 U/L Final  . ALT  10/26/2019 30  0 - 44 U/L Final  . Alkaline Phosphatase 10/26/2019 104  38 - 126 U/L Final  . Total Bilirubin 10/26/2019 0.4  0.3 - 1.2 mg/dL Final  . Bilirubin, Direct 10/26/2019 <0.1  0.0 - 0.2 mg/dL Final  . Indirect Bilirubin 10/26/2019 NOT CALCULATED  0.3 - 0.9 mg/dL Final   Performed at [facility], [address], [place], [place] [address]  . TSH 10/26/2019 1.159  0.350 - 4.500 uIU/mL Final   Comment: Performed by a 3rd Generation assay with a functional sensitivity of <=0.01 uIU/mL. Performed at [facility], [address], [place], [place] [address]   . Prolactin 10/26/2019 111.0* 4.8 - 23.3 ng/mL Final   Comment: (NOTE) Performed At: [facility] [address] [place], [place] [id number] Jolene Schimke MD TD:[phone]   . Color, Urine 10/25/2019 YELLOW  YELLOW Final  . APPearance 10/25/2019 CLEAR  CLEAR Final  .  Specific Gravity, Urine 10/25/2019 1.013  1.005 - 1.030 Final  . pH 10/25/2019 7.0  5.0 - 8.0 Final  . Glucose, UA 10/25/2019 NEGATIVE  NEGATIVE mg/dL Final  . Hgb urine dipstick 10/25/2019 NEGATIVE  NEGATIVE Final  . Bilirubin Urine 10/25/2019 NEGATIVE  NEGATIVE Final  . Ketones, ur 10/25/2019 NEGATIVE  NEGATIVE mg/dL Final  . Protein, ur 37/34/2876 NEGATIVE  NEGATIVE mg/dL Final  . Nitrite 81/15/7262 NEGATIVE  NEGATIVE Final  . Leukocytes,Ua 10/25/2019 NEGATIVE  NEGATIVE Final  . WBC, UA 10/25/2019 0-5  0 - 5 WBC/hpf Final  . Bacteria, UA 10/25/2019 NONE SEEN  NONE SEEN Final  . Squamous Epithelial / LPF 10/25/2019 0-5  0 - 5 Final  . Mucus 10/25/2019 PRESENT   Final   Performed at [facility], [address], [place], [place] [address]  . Preg Test, Ur 10/25/2019 NEGATIVE  NEGATIVE Final   Comment:        THE SENSITIVITY OF THIS METHODOLOGY IS >20 mIU/mL. Performed at [facility], [address], [place], [place] [address]   . Opiates 10/25/2019 NONE DETECTED  NONE  DETECTED Final  . Cocaine 10/25/2019 NONE DETECTED  NONE DETECTED Final  . Benzodiazepines 10/25/2019 NONE DETECTED  NONE DETECTED Final  . Amphetamines 10/25/2019 NONE DETECTED  NONE DETECTED Final  . Tetrahydrocannabinol 10/25/2019 NONE DETECTED  NONE DETECTED Final  . Barbiturates 10/25/2019 NONE DETECTED  NONE DETECTED Final   Comment: (NOTE) DRUG SCREEN FOR MEDICAL PURPOSES ONLY.  IF CONFIRMATION IS NEEDED FOR ANY PURPOSE, NOTIFY LAB WITHIN 5 DAYS. LOWEST DETECTABLE LIMITS FOR URINE DRUG SCREEN Drug Class                     Cutoff (ng/mL) Amphetamine and metabolites    1000 Barbiturate and metabolites    200 Benzodiazepine                 200 Tricyclics and metabolites     300 Opiates and metabolites        300 Cocaine and metabolites        300 THC                            50 Performed at [facility], [address] [place], [place] [address]   . Valproic Acid Lvl 10/26/2019 81  50.0 - 100.0 ug/mL Final   Performed at [facility], [address], [place], [place] [address]    Allergies: Benadryl [diphenhydramine] and Penicillins  PTA Medications: (Not in a hospital admission)   Medical Decision Making  Disposition: Recommend overnight observation for monitoring and stabilization. Supportive therapy provided about ongoing stressors.    Recommendations  Based on my evaluation the patient does not appear to have an emergency medical condition.  Wandra Arthurs, NP 05/06/20  10:44 PM

## 2020-05-07 LAB — POCT URINE DRUG SCREEN - MANUAL ENTRY (I-SCREEN)
POC Amphetamine UR: NOT DETECTED
POC Buprenorphine (BUP): NOT DETECTED
POC Cocaine UR: NOT DETECTED
POC Marijuana UR: POSITIVE — AB
POC Methadone UR: NOT DETECTED
POC Methamphetamine UR: NOT DETECTED
POC Morphine: NOT DETECTED
POC Oxazepam (BZO): NOT DETECTED
POC Oxycodone UR: NOT DETECTED
POC Secobarbital (BAR): NOT DETECTED

## 2020-05-07 NOTE — ED Notes (Signed)
D: Pt alert and oriented on the unit.   A: Education, support, and encouragement provided. Discharge summary, medications and follow up appointments reviewed with pt and legal guardian, Tami [name]. Suicide prevention resources provided, including "My 3 App." Pt's belongings in locker # 27 returned.  R: Pt denies SI/HI, A/VH, pain, or any concerns at this time. Pt ambulatory on and off unit. Pt discharged to lobby.

## 2020-05-07 NOTE — ED Notes (Signed)
D:Patient denies SI/HI. Patient in a pleasant mood.   A:Patient provided support and encouragement. Monitoring continues.  R:Patient receptive to treatment. Patient remains safe on unit.

## 2020-05-07 NOTE — ED Notes (Signed)
Patient reported still feeling suicidal. Reported this to the nurse.

## 2020-05-07 NOTE — Discharge Instructions (Addendum)
Continue current medications Keep follow up appointments

## 2020-05-07 NOTE — ED Provider Notes (Signed)
[facility]BC/OBS ASAP Discharge Summary  Date and Time: 05/07/2020 10:07 AM  Name: Tami ChampagneCrystal Ann [name]  MRN:  [id number]   Discharge Diagnoses:  Final diagnoses:  Bipolar disorder, current episode mixed, mild (HCC)    Subjective: Patient reports that she is feeling better today, but is not happy with her boyfriend, because he has been talking to another girl and she asked him to stop and he got upset and hung the phone up on her. She feels her medications could be changed but agrees to have her outpatient provider make the  changes. She denies any suicidal or homicidal ideations and denies any hallucinations today. She does report that she is concerned about the group home staff carrying a gun and she does not like it.  Contacted Wilford CornerKeshea Montgomery (group home owner) and Concepcion Elkatasha Scott (legal guardian on a conference call. They report that the patient was gone with her family over the weekend and they did not take enough medications for the patient and she went without her medications for about 2 days. They attempted to meet with the family for the medication, but the family refused. They state that the patient has been doing good recently but has been trying to get her own guardianship back because she wants to live on her own again. They report that the patient is in a day program with her boyfriend and are concerned this could potentially cause a problem for the program. The legal guardian reports that the patient has been known to fabricate stories when she doesn't get what she wants.  Stay Summary: Patient is 29 year old female [facility] and reported suicidal ideations and auditory hallucinations with commands. She was admitted to the continuous observation unit overnight and was restarted on her medications.. The patient was pleasant, calm, and cooperative. She was complaint with medications and treatment. She denies any suicidal or homicidal ideations and denies any hallucinations.  Group home staff will transport her back to the group home. There are no safety concerns with discharge to group home per legal guardian and group home staff.   Total Time spent with patient: 30 minutes  Past Psychiatric History: MDD, AMS, bipolar disorder, agitation, bipolar affective Past Medical History:  Past Medical History:  Diagnosis Date  . ADHD (attention deficit hyperactivity disorder)   . Bipolar disorder (HCC)   . Chronic back pain   . Seizures (HCC)     Past Surgical History:  Procedure Laterality Date  . THROAT SURGERY     throat surgery when younger doesn't know why   Family History:  Family History  Problem Relation Age of Onset  . Dementia Neg Hx    Family Psychiatric History: None reported Social History:  Social History   Substance and Sexual Activity  Alcohol Use No  . Alcohol/week: 0.0 standard drinks     Social History   Substance and Sexual Activity  Drug Use No    Social History   Socioeconomic History  . Marital status: Single    Spouse name: Not on file  . Number of children: Not on file  . Years of education: GED  . Highest education level: Not on file  Occupational History  . Not on file  Tobacco Use  . Smoking status: Current Some Day Smoker    Packs/day: 0.25    Years: 4.00    Pack years: 1.00    Types: Cigarettes  . Smokeless tobacco: Never Used  . Tobacco comment: pt says last  smoked was 2 yrs ago  Substance and Sexual Activity  . Alcohol use: No    Alcohol/week: 0.0 standard drinks  . Drug use: No  . Sexual activity: Not Currently    Birth control/protection: Other-see comments    Comment: pt says she gets Depo shot  Other Topics Concern  . Not on file  Social History Narrative   Lives at other Residents and staff of group home   Caffeine use: Drinks soda rarely    Social Determinants of Health   Financial Resource Strain:   . Difficulty of Paying Living Expenses:   Food Insecurity:   . Worried About Patent examiner in the Last Year:   . Barista in the Last Year:   Transportation Needs:   . Freight forwarder (Medical):   Marland Kitchen Lack of Transportation (Non-Medical):   Physical Activity:   . Days of Exercise per Week:   . Minutes of Exercise per Session:   Stress:   . Feeling of Stress :   Social Connections:   . Frequency of Communication with Friends and Family:   . Frequency of Social Gatherings with Friends and Family:   . Attends Religious Services:   . Active Member of Clubs or Organizations:   . Attends Banker Meetings:   Marland Kitchen Marital Status:    SDOH:  SDOH Screenings   Alcohol Screen: Low Risk   . Last Alcohol Screening Score (AUDIT): 0  Depression (PHQ2-9): Medium Risk  . PHQ-2 Score: 15  Financial Resource Strain:   . Difficulty of Paying Living Expenses:   Food Insecurity:   . Worried About Programme researcher, broadcasting/film/video in the Last Year:   . [employer] of Food in the Last Year:   Housing:   . Last Housing Risk Score:   Physical Activity:   . Days of Exercise per Week:   . Minutes of Exercise per Session:   Social Connections:   . Frequency of Communication with Friends and Family:   . Frequency of Social Gatherings with Friends and Family:   . Attends Religious Services:   . Active Member of Clubs or Organizations:   . Attends Banker Meetings:   Marland Kitchen Marital Status:   Stress:   . Feeling of Stress :   Tobacco Use: High Risk  . Smoking Tobacco Use: Current Some Day Smoker  . Smokeless Tobacco Use: Never Used  Transportation Needs:   . Freight forwarder (Medical):   Marland Kitchen Lack of Transportation (Non-Medical):     Has this patient used any form of tobacco in the last 30 days? (Cigarettes, Smokeless Tobacco, Cigars, and/or Pipes) Prescription not provided because: patient does not smoke  Current Medications:  Current Facility-Administered Medications  Medication Dose Route Frequency Provider Last Rate Last Admin  . acetaminophen (TYLENOL)  tablet 650 mg  650 mg Oral Q6H PRN Anike, Adaku C, NP      . alum & mag hydroxide-simeth (MAALOX/MYLANTA) 200-200-20 MG/5ML suspension 30 mL  30 mL Oral Q4H PRN Anike, Adaku C, NP      . divalproex (DEPAKOTE) DR tablet 500 mg  500 mg Oral Q12H Anike, Adaku C, NP   500 mg at 05/07/20 0956  . hydrOXYzine (ATARAX/VISTARIL) tablet 25 mg  25 mg Oral TID PRN Anike, Adaku C, NP   25 mg at 05/06/20 2344  . Iloperidone TABS 4 mg  4 mg Oral TID Anike, Adaku C, NP   4 mg at 05/07/20  0100  . levothyroxine (SYNTHROID) tablet 25 mcg  25 mcg Oral Q0600 Anike, Adaku C, NP   25 mcg at 05/07/20 0639  . magnesium hydroxide (MILK OF MAGNESIA) suspension 30 mL  30 mL Oral Daily PRN Anike, Adaku C, NP      . nicotine polacrilex (NICORETTE) gum 2 mg  2 mg Oral PRN Anike, Adaku C, NP      . traZODone (DESYREL) tablet 100 mg  100 mg Oral QHS Anike, Adaku C, NP   100 mg at 05/06/20 2344   Current Outpatient Medications  Medication Sig Dispense Refill  . AMITIZA 24 MCG capsule Take 24 mcg by mouth 2 (two) times daily.    Marland Kitchen atorvastatin (LIPITOR) 40 MG tablet Take 40 mg by mouth daily.    . diclofenac Sodium (VOLTAREN) 1 % GEL Apply 4 g topically 2 (two) times daily.    . divalproex (DEPAKOTE) 500 MG DR tablet Take 1 tablet (500 mg total) by mouth every 12 (twelve) hours. 60 tablet 0  . hydrOXYzine (ATARAX/VISTARIL) 25 MG tablet Take 1 tablet (25 mg total) by mouth 3 (three) times daily as needed for anxiety. 30 tablet 0  . ibuprofen (ADVIL) 800 MG tablet Take 800 mg by mouth 3 (three) times daily. With food    . Iloperidone 2 MG TABS Take 2 tablets (4 mg total) by mouth 3 (three) times daily. 90 tablet 0  . levothyroxine (SYNTHROID) 25 MCG tablet Take 1 tablet (25 mcg total) by mouth daily at 6 (six) AM. 30 tablet 0  . medroxyPROGESTERone (DEPO-PROVERA) 150 MG/ML injection Inject 150 mg into the muscle every 3 (three) months.    . Multiple Vitamin (MULTIVITAMIN WITH MINERALS) TABS tablet Take 1 tablet by mouth daily.     . phentermine (ADIPEX-P) 37.5 MG tablet Take 37.5 mg by mouth daily before breakfast.    . traZODone (DESYREL) 100 MG tablet Take 2 tablets (200 mg total) by mouth at bedtime as needed for sleep. 30 tablet 0  . nicotine polacrilex (NICORETTE) 2 MG gum Take 1 each (2 mg total) by mouth as needed for smoking cessation. (Patient not taking: Reported on 05/07/2020) 100 tablet 0    PTA Medications: (Not in a hospital admission)   Musculoskeletal  Strength & Muscle Tone: within normal limits Gait & Station: normal Patient leans: N/A  Psychiatric Specialty Exam  Presentation  General Appearance: Disheveled;Casual  Eye Contact:Good  Speech:Clear and Coherent;Normal Rate  Speech Volume:Normal  Handedness:Right   Mood and Affect  Mood:Euthymic (Irritable about boyfriend)  Affect:Congruent   Thought Process  Thought Processes:Coherent  Descriptions of Associations:Intact  Orientation:Full (Time, Place and Person)  Thought Content:WDL  Hallucinations:Hallucinations: None  Ideas of Reference:None  Suicidal Thoughts:Suicidal Thoughts: No SI Active Intent and/or Plan: With Plan  Homicidal Thoughts:Homicidal Thoughts: No   Sensorium  Memory:Immediate Good;Recent Good;Remote Good  Judgment:Fair  Insight:Poor   Executive Functions  Concentration:Fair  Attention Span:Fair  Recall:Good  Fund of Knowledge:Fair  Language:Good   Psychomotor Activity  Psychomotor Activity:Psychomotor Activity: Normal   Assets  Assets:Communication Skills;Desire for Improvement;Financial Resources/Insurance;Housing;Social Support;Transportation   Sleep  Sleep:Sleep: Good Number of Hours of Sleep: 6   Physical Exam  Physical Exam Vitals and nursing note reviewed.  Constitutional:      Appearance: She is well-developed.  Cardiovascular:     Rate and Rhythm: Tachycardia present.  Pulmonary:     Effort: Pulmonary effort is normal.  Musculoskeletal:        General:  Normal range of motion.  Skin:    General: Skin is warm.  Neurological:     Mental Status: She is alert and oriented to person, place, and time.    Review of Systems  Constitutional: Negative.   HENT: Negative.   Eyes: Negative.   Respiratory: Negative.   Cardiovascular: Negative.   Gastrointestinal: Negative.   Genitourinary: Negative.   Musculoskeletal: Negative.   Skin: Negative.   Neurological: Negative.   Endo/Heme/Allergies: Negative.   Psychiatric/Behavioral: Positive for depression.   Blood pressure 127/85, pulse (!) 109, temperature 98.7 F (37.1 C), temperature source Oral, resp. rate 18, height 4\' 9"  (1.448 m), weight 201 lb (91.2 kg), SpO2 99 %. Body mass index is 43.5 kg/m.  Demographic Factors:  Caucasian  Loss Factors: NA  Historical Factors: Impulsivity  Risk Reduction Factors:   Living with another person, especially a relative, Positive social support, Positive therapeutic relationship and Positive coping skills or problem solving skills  Continued Clinical Symptoms:  Previous Psychiatric Diagnoses and Treatments  Cognitive Features That Contribute To Risk:  None    Suicide Risk:  Minimal: No identifiable suicidal ideation.  Patients presenting with no risk factors but with morbid ruminations; may be classified as minimal risk based on the severity of the depressive symptoms  Plan Of Care/Follow-up recommendations:  Continue activity as tolerated. Continue diet as recommended by your PCP. Ensure to keep all appointments with outpatient providers.  Disposition: Discharge back to Group Home with group home owner. Legal guardian and group owner felt safe with patient discharging home and continuing current medications and take her to follow up appointments  , FNP 05/07/2020, 10:07 AM

## 2020-05-11 ENCOUNTER — Ambulatory Visit (HOSPITAL_COMMUNITY)
Admission: EM | Admit: 2020-05-11 | Discharge: 2020-05-12 | Disposition: A | Discharge status: Home / Self Care | Payer: Medicaid Other | Attending: Family | Admitting: Family

## 2020-05-11 ENCOUNTER — Encounter (HOSPITAL_COMMUNITY): Payer: Self-pay | Admitting: Emergency Medicine

## 2020-05-11 ENCOUNTER — Other Ambulatory Visit: Payer: Self-pay

## 2020-05-11 DIAGNOSIS — R45851 Suicidal ideations: Secondary | ICD-10-CM | POA: Insufficient documentation

## 2020-05-11 DIAGNOSIS — Z20822 Contact with and (suspected) exposure to covid-19: Secondary | ICD-10-CM | POA: Insufficient documentation

## 2020-05-11 DIAGNOSIS — F319 Bipolar disorder, unspecified: Secondary | ICD-10-CM | POA: Insufficient documentation

## 2020-05-11 DIAGNOSIS — R440 Auditory hallucinations: Secondary | ICD-10-CM | POA: Insufficient documentation

## 2020-05-11 DIAGNOSIS — R454 Irritability and anger: Secondary | ICD-10-CM | POA: Diagnosis not present

## 2020-05-11 DIAGNOSIS — F19251 Other psychoactive substance dependence with psychoactive substance-induced psychotic disorder with hallucinations: Secondary | ICD-10-CM | POA: Diagnosis present

## 2020-05-11 DIAGNOSIS — F17210 Nicotine dependence, cigarettes, uncomplicated: Secondary | ICD-10-CM | POA: Insufficient documentation

## 2020-05-11 DIAGNOSIS — F1290 Cannabis use, unspecified, uncomplicated: Secondary | ICD-10-CM | POA: Insufficient documentation

## 2020-05-11 DIAGNOSIS — Z79899 Other long term (current) drug therapy: Secondary | ICD-10-CM | POA: Insufficient documentation

## 2020-05-11 DIAGNOSIS — F19239 Other psychoactive substance dependence with withdrawal, unspecified: Secondary | ICD-10-CM

## 2020-05-11 LAB — CBC WITH DIFFERENTIAL/PLATELET
Abs Immature Granulocytes: 0.05 10*3/uL (ref 0.00–0.07)
Basophils Absolute: 0 10*3/uL (ref 0.0–0.1)
Basophils Relative: 0 %
Eosinophils Absolute: 0.1 10*3/uL (ref 0.0–0.5)
Eosinophils Relative: 1 %
HCT: 41.4 % (ref 36.0–46.0)
Hemoglobin: 13.2 g/dL (ref 12.0–15.0)
Immature Granulocytes: 1 %
Lymphocytes Relative: 39 %
Lymphs Abs: 3.4 10*3/uL (ref 0.7–4.0)
MCH: 26.6 pg (ref 26.0–34.0)
MCHC: 31.9 g/dL (ref 30.0–36.0)
MCV: 83.5 fL (ref 80.0–100.0)
Monocytes Absolute: 0.5 10*3/uL (ref 0.1–1.0)
Monocytes Relative: 6 %
Neutro Abs: 4.5 10*3/uL (ref 1.7–7.7)
Neutrophils Relative %: 53 %
Platelets: 334 10*3/uL (ref 150–400)
RBC: 4.96 MIL/uL (ref 3.87–5.11)
RDW: 14.1 % (ref 11.5–15.5)
WBC: 8.6 10*3/uL (ref 4.0–10.5)
nRBC: 0 % (ref 0.0–0.2)

## 2020-05-11 LAB — PREGNANCY, URINE: Preg Test, Ur: NEGATIVE

## 2020-05-11 LAB — URINALYSIS, ROUTINE W REFLEX MICROSCOPIC
Bilirubin Urine: NEGATIVE
Glucose, UA: NEGATIVE mg/dL
Hgb urine dipstick: NEGATIVE
Ketones, ur: NEGATIVE mg/dL
Leukocytes,Ua: NEGATIVE
Nitrite: NEGATIVE
Protein, ur: NEGATIVE mg/dL
Specific Gravity, Urine: 1.011 (ref 1.005–1.030)
pH: 5 (ref 5.0–8.0)

## 2020-05-11 LAB — POCT URINE DRUG SCREEN - MANUAL ENTRY (I-SCREEN)
POC Amphetamine UR: NOT DETECTED
POC Buprenorphine (BUP): NOT DETECTED
POC Cocaine UR: NOT DETECTED
POC Marijuana UR: NOT DETECTED
POC Methadone UR: NOT DETECTED
POC Methamphetamine UR: NOT DETECTED
POC Morphine: NOT DETECTED
POC Oxazepam (BZO): NOT DETECTED
POC Oxycodone UR: NOT DETECTED
POC Secobarbital (BAR): NOT DETECTED

## 2020-05-11 LAB — COMPREHENSIVE METABOLIC PANEL
ALT: 22 U/L (ref 0–44)
AST: 18 U/L (ref 15–41)
Albumin: 3.4 g/dL — ABNORMAL LOW (ref 3.5–5.0)
Alkaline Phosphatase: 98 U/L (ref 38–126)
Anion gap: 13 (ref 5–15)
BUN: 11 mg/dL (ref 6–20)
CO2: 21 mmol/L — ABNORMAL LOW (ref 22–32)
Calcium: 9 mg/dL (ref 8.9–10.3)
Chloride: 103 mmol/L (ref 98–111)
Creatinine, Ser: 0.63 mg/dL (ref 0.44–1.00)
GFR calc Af Amer: >60 mL/min (ref >60)
GFR calc non Af Amer: >60 mL/min (ref >60)
Glucose, Bld: 127 mg/dL — ABNORMAL HIGH (ref 70–99)
Potassium: 3.7 mmol/L (ref 3.5–5.1)
Sodium: 137 mmol/L (ref 135–145)
Total Bilirubin: 0.4 mg/dL (ref 0.3–1.2)
Total Protein: 7.5 g/dL (ref 6.5–8.1)

## 2020-05-11 LAB — TSH: TSH: 0.73 u[IU]/mL (ref 0.350–4.500)

## 2020-05-11 LAB — POC SARS CORONAVIRUS 2 AG: SARS Coronavirus 2 Ag: NEGATIVE

## 2020-05-11 MED ORDER — TRAZODONE HCL 100 MG PO TABS
100.0000 mg | ORAL_TABLET | Freq: Every evening | ORAL | Status: DC | PRN
  Administered 2020-05-11: 100 mg via ORAL
  Filled 2020-05-11: qty 1

## 2020-05-11 MED ORDER — MAGNESIUM HYDROXIDE 400 MG/5ML PO SUSP: 30.0000 mL | Freq: Every day | ORAL | Status: DC | PRN

## 2020-05-11 MED ORDER — ALUM & MAG HYDROXIDE-SIMETH 200-200-20 MG/5ML PO SUSP: 30.0000 mL | ORAL | Status: DC | PRN

## 2020-05-11 MED ORDER — ACETAMINOPHEN 325 MG PO TABS: 650.0000 mg | ORAL_TABLET | Freq: Four times a day (QID) | ORAL | Status: DC | PRN

## 2020-05-11 MED ORDER — OLANZAPINE 5 MG PO TABS
5.0000 mg | ORAL_TABLET | Freq: Once | ORAL | Status: AC
  Administered 2020-05-11: 5 mg via ORAL
  Filled 2020-05-11: qty 1

## 2020-05-11 NOTE — ED Notes (Signed)
Presents with complaint of SI, plan to run into traffic.  Hearing voices telling her to harm herself.  Admits to smoking marijuana also.  A&O x 4, no distress noted, calm & cooperatives

## 2020-05-11 NOTE — ED Triage Notes (Signed)
Presents with complaint of auditory hallucinations to harm self.
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
## 2020-05-11 NOTE — ED Notes (Signed)
Pt sleeping at present, no distress noted, calm & cooperative at present,  Monitoring for safety.

## 2020-05-11 NOTE — ED Notes (Signed)
covid - preg-

## 2020-05-11 NOTE — ED Provider Notes (Signed)
Behavioral Health Admission [facility]&P [facility] & [facility])  Date: 05/11/20 Patient Name: Tami [name] MRN: [id number] Chief Complaint:Hallucinations    Diagnoses:  Final diagnoses:  None    HPI: Patient presents to behavioral health urgent care voluntarily as a walk-in for active suicidal ideations.  Patient also reports she has been having command hallucinations to harm herself, and hearing voices.  Patient states" I am hearing voices that are telling me to kill myself.  Jump into the car and open the door on the freeway.  And run into traffic."  Patient states that she has also been smoking marijuana while on her home visits, and still voices have been worse since.  She reports wanting to get help for her hallucinations and suicidal thoughts, and would like to come inpatient for treatment and crisis stabilization. "  I do not want to have to keep coming back over here and being sent home, I really need to stay for a few days to get my head together and get back stable on my medication. "  Patient is a poor historian and unable to recall her medications, and the name of her psychiatrist, and or her previous psychiatric history.  Patient states while in the lobby waiting for an assessment she had thoughts to stab herself with a pencil today, however she is stab the Desk at the front instead. "  The voices are telling me to throw stuff around in here and to stab myself with a pencil, I did not stab the days."  This was confirmed by nursing staff as patient was displaying signs of psychomotor agitation in the lobby and was brought back to an assessment room.  At this time she currently denies homicidal ideations, visual hallucinations, and or paranoia.  She does not appear to be responding to internal stimuli, external stimuli, and or preoccupied.  It is not quite clear of patient's true intentions for coming to behavioral health urgent care, as it is noted she was present here 5 days ago and presents with the same  symptoms.  At this time she is currently requesting inpatient, however discussed with patient we will most likely observe her overnight and reassess her in the morning for inpatient admission.  At this time she does not appear to be acutely psychotic, displaying overt behaviors, and or paranoia.   PHQ 2-9:    ED from 05/06/2020 in [facility]  Thoughts that you would be better off dead, or of hurting yourself in some way More than half the days  PHQ-9 Total Score 15        ED from 05/06/2020 in [facility] Admission (Discharged) from OP [facility]  C-SSRS RISK CATEGORY High Risk No Risk       Total Time spent with patient: 30 minutes  Musculoskeletal  Strength & Muscle Tone: within normal limits Gait & Station: normal Patient leans: N/A  Psychiatric Specialty Exam  Presentation General Appearance: Casual;Fairly Groomed;Other (comment) (gaurded)  Eye Contact:Good  Speech:Pressured;Clear and Coherent  Speech Volume:Normal  Handedness:Right   Mood and Affect  Mood:Irritable;Dysphoric  Affect:Blunt;Full Range   Thought Process  Thought Processes:Linear  Descriptions of Associations:Tangential  Orientation:Full (Time, Place and Person)  Thought Content:Illogical;Perseveration;Tangential;Rumination  Hallucinations:Hallucinations: Auditory;Command Description of Command Hallucinations: run into traffic, drive down the road and open up the car door, stab herself Description of Auditory Hallucinations: loud voices, intense  Ideas of Reference:None  Suicidal Thoughts:Suicidal Thoughts:  Yes, Active SI Active Intent and/or Plan: With Intent;With Means to Carry Out  Homicidal Thoughts:Homicidal Thoughts: No   Sensorium  Memory:Immediate Fair;Recent Good;Remote Good  Judgment:Intact  Insight:Shallow   Executive Functions  Concentration:Fair   Attention Span:Fair  Recall:Fair  Fund of Knowledge:Fair  Language:Fair   Psychomotor Activity  Psychomotor Activity:Psychomotor Activity: Increased;Restlessness   Assets  Assets:Physical Health;Housing;Social Support   Sleep  Sleep:Sleep: Fair   Physical Exam ROS  Blood pressure 120/82, pulse 83, temperature 97.7 F (36.5 C), temperature source Temporal, resp. rate 18, height 4\' 9"  (1.448 m), weight 197 lb (89.4 kg), SpO2 100 %. Body mass index is 42.63 kg/m.  Past Psychiatric History: UTA poor historian. Can not recall pertinent information.   Is the patient at risk to self? Yes  Has the patient been a risk to self in the past 6 months? No .    Has the patient been a risk to self within the distant past? Yes   Is the patient a risk to others? No   Has the patient been a risk to others in the past 6 months? No   Has the patient been a risk to others within the distant past? No   Past Medical History:  Past Medical History:  Diagnosis Date  . ADHD (attention deficit hyperactivity disorder)   . Bipolar disorder (HCC)   . Chronic back pain   . Seizures (HCC)     Past Surgical History:  Procedure Laterality Date  . THROAT SURGERY     throat surgery when younger doesn't know why    Family History:  Family History  Problem Relation Age of Onset  . Dementia Neg Hx     Social History:  Social History   Socioeconomic History  . Marital status: Single    Spouse name: Not on file  . Number of children: Not on file  . Years of education: GED  . Highest education level: Not on file  Occupational History  . Not on file  Tobacco Use  . Smoking status: Current Some Day Smoker    Packs/day: 0.25    Years: 4.00    Pack years: 1.00    Types: Cigarettes  . Smokeless tobacco: Never Used  . Tobacco comment: pt says last smoked was 2 yrs ago  Substance and Sexual Activity  . Alcohol use: No    Alcohol/week: 0.0 standard drinks  . Drug use: No  . Sexual  activity: Not Currently    Birth control/protection: Other-see comments    Comment: pt says she gets Depo shot  Other Topics Concern  . Not on file  Social History Narrative   Lives at other Residents and staff of group home   Caffeine use: Drinks soda rarely    Social Determinants of Health   Financial Resource Strain:   . Difficulty of Paying Living Expenses:   Food Insecurity:   . Worried About in the Last Year:   . Programme researcher, broadcasting/film/video in the Last Year:   Transportation Needs:   . Barista (Medical):   Freight forwarder Lack of Transportation (Non-Medical):   Physical Activity:   . Days of Exercise per Week:   . Minutes of Exercise per Session:   Stress:   . Feeling of Stress :   Social Connections:   . Frequency of Communication with Friends and Family:   . Frequency of Social Gatherings with Friends and Family:   . Attends Religious Services:   .  Active Member of Clubs or Organizations:   . Attends BankerClub or Organization Meetings:   Marland Kitchen. Marital Status:   Intimate Partner Violence:   . Fear of Current or Ex-Partner:   . Emotionally Abused:   Marland Kitchen. Physically Abused:   . Sexually Abused:     SDOH:  SDOH Screenings   Alcohol Screen: Low Risk   . Last Alcohol Screening Score (AUDIT): 0  Depression (PHQ2-9): Medium Risk  . PHQ-2 Score: 15  Financial Resource Strain:   . Difficulty of Paying Living Expenses:   Food Insecurity:   . Worried About Programme researcher, broadcasting/film/videounning Out of Food in the Last Year:   . [employer] Out of Food in the Last Year:   Housing:   . Last Housing Risk Score:   Physical Activity:   . Days of Exercise per Week:   . Minutes of Exercise per Session:   Social Connections:   . Frequency of Communication with Friends and Family:   . Frequency of Social Gatherings with Friends and Family:   . Attends Religious Services:   . Active Member of Clubs or Organizations:   . Attends BankerClub or Organization Meetings:   Marland Kitchen. Marital Status:   Stress:   . Feeling of Stress  :   Tobacco Use: High Risk  . Smoking Tobacco Use: Current Some Day Smoker  . Smokeless Tobacco Use: Never Used  Transportation Needs:   . Freight forwarderLack of Transportation (Medical):   Marland Kitchen. Lack of Transportation (Non-Medical):     Last Labs:  Admission on 05/06/2020, Discharged on 05/07/2020  Component Date Value Ref Range Status  . POC Amphetamine UR 05/07/2020 None Detected  None Detected Final  . POC Secobarbital (BAR) 05/07/2020 None Detected  None Detected Final  . POC Buprenorphine (BUP) 05/07/2020 None Detected  None Detected Final  . POC Oxazepam (BZO) 05/07/2020 None Detected  None Detected Final  . POC Cocaine UR 05/07/2020 None Detected  None Detected Final  . POC Methamphetamine UR 05/07/2020 None Detected  None Detected Final  . POC Morphine 05/07/2020 None Detected  None Detected Final  . POC Oxycodone UR 05/07/2020 None Detected  None Detected Final  . POC Methadone UR 05/07/2020 None Detected  None Detected Final  . POC Marijuana UR 05/07/2020 Positive* None Detected Final  . Glucose, UA 05/06/2020 NEGATIVE  NEGATIVE mg/dL Final  . Bilirubin Urine 05/06/2020 NEGATIVE  NEGATIVE Final  . Ketones, ur 05/06/2020 40* NEGATIVE mg/dL Final  . Specific Gravity, Urine 05/06/2020 >=1.030  1.005 - 1.030 Final  . Hgb urine dipstick 05/06/2020 TRACE* NEGATIVE Final  . pH 05/06/2020 6.5  5.0 - 8.0 Final  . Protein, ur 05/06/2020 NEGATIVE  NEGATIVE mg/dL Final  . Urobilinogen, UA 05/06/2020 0.2  0.0 - 1.0 mg/dL Final  . Nitrite 16/10/960407/04/2020 NEGATIVE  NEGATIVE Final  . Glori LuisLeukocytes,Ua 05/06/2020 SMALL* NEGATIVE Final   Biochemical Testing Only. Please order routine urinalysis from main lab if confirmatory testing is needed.  . Glucose-Capillary 05/06/2020 160* 70 - 99 mg/dL Final   Glucose reference range applies only to samples taken after fasting for at least 8 hours.  . Comment 1 05/06/2020 ATE   Final  . SARS Coronavirus 2 Ag 05/06/2020 NEGATIVE  NEGATIVE Final   Comment: (NOTE) SARS-CoV-2  antigen NOT DETECTED.   Negative results are presumptive.  Negative results do not preclude SARS-CoV-2 infection and should not be used as the sole basis for treatment or other patient management decisions, including infection  control decisions, particularly in the presence of clinical  signs and  symptoms consistent with COVID-19, or in those who have been in contact with the virus.  Negative results must be combined with clinical observations, patient history, and epidemiological information. The expected result is Negative.  Fact Sheet for Patients: https://sanders-williams.net/  Fact Sheet for Healthcare Providers: https://martinez.com/   This test is not yet approved or cleared by the [place] FDA and  has been authorized for detection and/or diagnosis of SARS-CoV-2 by FDA under an Emergency Use Authorization (EUA).  This EUA will remain in effect (meaning this test can be used) for the duration of  the C                          OVID-19 declaration under Section 564(b)(1) of the Act, 21 U.S.C. section 360bbb-3(b)(1), unless the authorization is terminated or revoked sooner.      Allergies: Benadryl [diphenhydramine] and Penicillins  PTA Medications: (Not in a hospital admission)   Medical Decision Making   Patient was brought into behavioral health urgent care by group home staff for psychiatric evaluation.  Patient does have a history of schizophrenia and was seen here 5 days ago by nurse practitioner.  Today she appears to be more alert and coherent, however continues to endorse auditory hallucinations and command hallucinations.  She also reports suicidal ideations that worsens with use of marijuana.  Today she states she heard voices telling her to run in front of cars, and drive down the street and open the door.  She has been in the group home since the age of 31, and reports stabilization which seems reasonable considering the  length of time she has been at this facility.  Patient is a poor historian and unable to identify her medications, psychiatrist, and or important phone numbers to obtain collateral information.  Patient denies any depression, however endorses active suicidal thoughts with access and means.  I do not feel that this patient needs to be put under IVC, nor to have inpatient hospitalization as she can be stabilized in the community with appropriate discontinuation of illicit substances that can make her psychosis worse.  Patient had no physical complaints, and does not need an emergent medical evaluation.  Patient states if discharge she will return and would like to be recommended inpatient until Thursday. Patient will not identify her reason for specific day of discharge,suspect she will return back home to her grandmothers where she is engaging in use of illicit substances.  Upon discharge she states she will return back to the group home.  Patient will be Follow-up overnight observation and reassessed in the morning by psychiatry.     Recommendations  Based on my evaluation the patient does not appear to have an emergency medical condition.  Will recommend continuous observation.  Will resume home medications once confirmed by pharmacy. Will recommend discontinuation of illicit substances that worsen psychosis and schizophrenia symptoms.   Maryagnes Amos, FNP 05/11/20  7:07 PM

## 2020-05-12 LAB — POCT PREGNANCY, URINE: Preg Test, Ur: NEGATIVE

## 2020-05-12 MED ORDER — DIVALPROEX SODIUM 500 MG PO DR TAB
500.0000 mg | DELAYED_RELEASE_TABLET | Freq: Two times a day (BID) | ORAL | Status: DC
  Administered 2020-05-12: 500 mg via ORAL
  Filled 2020-05-12: qty 1

## 2020-05-12 MED ORDER — LEVOTHYROXINE SODIUM 25 MCG PO TABS
25.0000 ug | ORAL_TABLET | Freq: Every day | ORAL | Status: DC
  Administered 2020-05-12: 25 ug via ORAL
  Filled 2020-05-12: qty 1

## 2020-05-12 MED ORDER — ILOPERIDONE 2 MG PO TABS
4.0000 mg | ORAL_TABLET | Freq: Three times a day (TID) | ORAL | Status: DC
  Administered 2020-05-12: 4 mg via ORAL
  Filled 2020-05-12 (×5): qty 2

## 2020-05-12 MED ORDER — ATORVASTATIN CALCIUM 40 MG PO TABS
40.0000 mg | ORAL_TABLET | Freq: Every day | ORAL | Status: DC
  Administered 2020-05-12: 40 mg via ORAL
  Filled 2020-05-12: qty 1

## 2020-05-12 MED ORDER — OLANZAPINE 5 MG PO TABS
5.0000 mg | ORAL_TABLET | Freq: Once | ORAL | Status: AC
  Administered 2020-05-12: 5 mg via ORAL
  Filled 2020-05-12: qty 1

## 2020-05-12 MED ORDER — TRAZODONE HCL 100 MG PO TABS: 200.0000 mg | ORAL_TABLET | Freq: Every evening | ORAL | Status: DC | PRN

## 2020-05-12 NOTE — ED Provider Notes (Signed)
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  Patient denies any suicidal or homicidal ideations.  Patient reports that her voices come and go and she may have them every other day.  She reports that there was no significant stressors that had happened recently.  She states that yesterday the voices came on and she told them that she needed to come to the hospital. Patient is legal guardian, [name], was contacted for collateral information.  She reports that last week the patient had requested to go have a family visit again and she informed the patient that it would be the next week before she could have authorization for her to take leave home.  She reports that the patient did not seem very pleased with that answer and she is concerned that this may be the patient manipulating the system so that she can get what she wants.  She states that the patient has made fabricated stories numerous times in the past when she does not get what she wants.  She states that she has had no reports about the patient at the group home having any issues. [name], group home owner, was contacted for collateral information.  She reports that the patient had a really good weekend and there were no complaints about the patient.  She states that yesterday she received a phone call from another staff member that the patient had requested to come to the hospital due to auditory hallucinations.  She reports that she is unsure what made the patient do this.  When discussing the patient requesting to go home and that she is also had previous experiences stating that she did not want to stay at the group home anymore she was concerned  that the patient was using this as a way to get out of the group home and then try to go home this weekend.  It was later confirmed to Ms. [name] as well as [name] that the patient was requesting to go to her family's home on Friday.  It was determined that this patient would not be going to her family's home this weekend because of the behavior manipulation. Patient was informed of the decision of discharging her home.  Patient stated that she is trying to seek her own legal guardianship again and that she is hoping to go see her family this weekend.  Patient was informed that with the behavior of coming to the hospital due to getting out of the group home would not be beneficial to her seeking her own legal guardianship again.  Patient stated that she understood and felt that she was safe to discharge back to the group home today.  Patient requested that I speak with her legal guardian to grant her approval to go visit her family this weekend.  Patient was informed that that would be up to the group home and her legal guardian.  Patient stated understanding.  Stay Summary: Patient is a 29 year old female that presented to the [facility] from her group home.  Patient was here approximately 1 week ago with similar presentation.  It was determined that patient is trying to avoid staying at the group home so that she can spend more time with her  family at home.  After discussing with group home as well as patient's legal guardian it was determined that the patient was manipulating the system so that she did not have to stay at the group home so that she could hopefully discharge from the hospital on Wednesday or Thursday so that she can go to her family's house on Friday.  The patient has specifically asked for discharge on Wednesday or Thursday for the specific reason.  Patient has been compliant with medications.  Patient has not shown any signs or symptoms of responding to internal or external stimuli.  Patient  does not appear to be psychotic.  Patient has not made any threatening or aggressive behaviors while she has been at the [facility].  Group home staff determined that the patient was safe to discharge home with them and they have no safety concerns at this time.  Legal guardian has stated that they are going to have a sitdown discussion with the patient.  They stated that they will be having a discussion about what is the best plan of care for the patient after she discharges from the [facility].  They did confirm that the patient is trying to seek her own legal guardianship again to the court system.  At this time the patient does not meet any inpatient psychiatric treatment and is psychiatric cleared.  Group home staff stated they will be here to pick the patient up between 12 and 2 PM.  Total Time spent with patient: 30 minutes  Past Psychiatric History: Bipolar disorder, SI, HI, multiple group homes due to behaviors, cocaine abuse, marijuana dependence Past Medical History:  Past Medical History:  Diagnosis Date  . ADHD (attention deficit hyperactivity disorder)   . Bipolar disorder (HCC)   . Chronic back pain   . Seizures (HCC)     Past Surgical History:  Procedure Laterality Date  . THROAT SURGERY     throat surgery when younger doesn't know why   Family History:  Family History  Problem Relation Age of Onset  . Dementia Neg Hx    Family Psychiatric History: None reported Social History:  Social History   Substance and Sexual Activity  Alcohol Use No  . Alcohol/week: 0.0 standard drinks     Social History   Substance and Sexual Activity  Drug Use No    Social History   Socioeconomic History  . Marital status: Single    Spouse name: Not on file  . Number of children: Not on file  . Years of education: GED  . Highest education level: Not on file  Occupational History  . Not on file  Tobacco Use  . Smoking status: Current Some Day Smoker    Packs/day: 0.25    Years: 4.00     Pack years: 1.00    Types: Cigarettes  . Smokeless tobacco: Never Used  . Tobacco comment: pt says last smoked was 2 yrs ago  Substance and Sexual Activity  . Alcohol use: No    Alcohol/week: 0.0 standard drinks  . Drug use: No  . Sexual activity: Not Currently    Birth control/protection: Other-see comments    Comment: pt says she gets Depo shot  Other Topics Concern  . Not on file  Social History Narrative   Lives at other Residents and staff of group home   Caffeine use: Drinks soda rarely    Social Determinants of Health   Financial Resource Strain:   . Difficulty of Paying Living Expenses:  Food Insecurity:   . Worried About Programme researcher, broadcasting/film/video in the Last Year:   . Barista in the Last Year:   Transportation Needs:   . Freight forwarder (Medical):   Marland Kitchen Lack of Transportation (Non-Medical):   Physical Activity:   . Days of Exercise per Week:   . Minutes of Exercise per Session:   Stress:   . Feeling of Stress :   Social Connections:   . Frequency of Communication with Friends and Family:   . Frequency of Social Gatherings with Friends and Family:   . Attends Religious Services:   . Active Member of Clubs or Organizations:   . Attends Banker Meetings:   Marland Kitchen Marital Status:    SDOH:  SDOH Screenings   Alcohol Screen: Low Risk   . Last Alcohol Screening Score (AUDIT): 0  Depression (PHQ2-9): Medium Risk  . PHQ-2 Score: 13  Financial Resource Strain:   . Difficulty of Paying Living Expenses:   Food Insecurity:   . Worried About Programme researcher, broadcasting/film/video in the Last Year:   . [employer] of Food in the Last Year:   Housing:   . Last Housing Risk Score:   Physical Activity:   . Days of Exercise per Week:   . Minutes of Exercise per Session:   Social Connections:   . Frequency of Communication with Friends and Family:   . Frequency of Social Gatherings with Friends and Family:   . Attends Religious Services:   . Active Member of Clubs or  Organizations:   . Attends Banker Meetings:   Marland Kitchen Marital Status:   Stress:   . Feeling of Stress :   Tobacco Use: High Risk  . Smoking Tobacco Use: Current Some Day Smoker  . Smokeless Tobacco Use: Never Used  Transportation Needs:   . Freight forwarder (Medical):   Marland Kitchen Lack of Transportation (Non-Medical):     Has this patient used any form of tobacco in the last 30 days? (Cigarettes, Smokeless Tobacco, Cigars, and/or Pipes) Prescription not provided because: doesn't smoke  Current Medications:  Current Facility-Administered Medications  Medication Dose Route Frequency Provider Last Rate Last Admin  . acetaminophen (TYLENOL) tablet 650 mg  650 mg Oral Q6H PRN Maryagnes Amos, FNP      . alum & mag hydroxide-simeth (MAALOX/MYLANTA) 200-200-20 MG/5ML suspension 30 mL  30 mL Oral Q4H PRN Rosario Adie, Juel Burrow, FNP      . atorvastatin (LIPITOR) tablet 40 mg  40 mg Oral Daily Serine Kea, Gerlene Burdock, FNP   40 mg at 05/12/20 0914  . divalproex (DEPAKOTE) DR tablet 500 mg  500 mg Oral Q12H Shakyla Nolley, Gerlene Burdock, FNP   500 mg at 05/12/20 0914  . Iloperidone TABS 4 mg  4 mg Oral TID Amante Fomby, Gerlene Burdock, FNP   4 mg at 05/12/20 0934  . levothyroxine (SYNTHROID) tablet 25 mcg  25 mcg Oral Q0600 Champagne Paletta, Gerlene Burdock, FNP   25 mcg at 05/12/20 1610  . magnesium hydroxide (MILK OF MAGNESIA) suspension 30 mL  30 mL Oral Daily PRN Starkes-Perry, Juel Burrow, FNP      . traZODone (DESYREL) tablet 200 mg  200 mg Oral QHS PRN Florentino Laabs, Gerlene Burdock, FNP       Current Outpatient Medications  Medication Sig Dispense Refill  . AMITIZA 24 MCG capsule Take 24 mcg by mouth 2 (two) times daily.    Marland Kitchen atorvastatin (LIPITOR) 40 MG tablet Take 40 mg by  mouth daily.    . diclofenac Sodium (VOLTAREN) 1 % GEL Apply 4 g topically 2 (two) times daily.    . divalproex (DEPAKOTE) 500 MG DR tablet Take 1 tablet (500 mg total) by mouth every 12 (twelve) hours. 60 tablet 0  . hydrOXYzine (ATARAX/VISTARIL) 25 MG tablet Take 1 tablet  (25 mg total) by mouth 3 (three) times daily as needed for anxiety. 30 tablet 0  . ibuprofen (ADVIL) 800 MG tablet Take 800 mg by mouth 3 (three) times daily. With food    . Iloperidone 2 MG TABS Take 2 tablets (4 mg total) by mouth 3 (three) times daily. 90 tablet 0  . levothyroxine (SYNTHROID) 25 MCG tablet Take 1 tablet (25 mcg total) by mouth daily at 6 (six) AM. 30 tablet 0  . medroxyPROGESTERone (DEPO-PROVERA) 150 MG/ML injection Inject 150 mg into the muscle every 3 (three) months.    . Multiple Vitamin (MULTIVITAMIN WITH MINERALS) TABS tablet Take 1 tablet by mouth daily.    . phentermine (ADIPEX-P) 37.5 MG tablet Take 37.5 mg by mouth daily before breakfast.    . traZODone (DESYREL) 100 MG tablet Take 2 tablets (200 mg total) by mouth at bedtime as needed for sleep. 30 tablet 0  . nicotine polacrilex (NICORETTE) 2 MG gum Take 1 each (2 mg total) by mouth as needed for smoking cessation. (Patient not taking: Reported on 05/12/2020) 100 tablet 0    PTA Medications: (Not in a hospital admission)   Musculoskeletal  Strength & Muscle Tone: within normal limits Gait & Station: normal Patient leans: N/A  Psychiatric Specialty Exam  Presentation  General Appearance: Appropriate for Environment;Casual  Eye Contact:Fair  Speech:Pressured;Clear and Coherent  Speech Volume:Normal  Handedness:Right   Mood and Affect  Mood:Irritable;Dysphoric  Affect:Blunt;Full Range   Thought Process  Thought Processes:Linear  Descriptions of Associations:Tangential  Orientation:Full (Time, Place and Person)  Thought Content:Illogical;Perseveration;Tangential;Rumination  Hallucinations:Hallucinations: Auditory;Command Description of Command Hallucinations: run into traffic, drive down the road and open up the car door, stab herself Description of Auditory Hallucinations: loud voices, intense  Ideas of Reference:None  Suicidal Thoughts:Suicidal Thoughts: Yes, Active SI Active Intent  and/or Plan: With Intent;With Means to Carry Out  Homicidal Thoughts:Homicidal Thoughts: No   Sensorium  Memory:Immediate Fair;Recent Good;Remote Good  Judgment:Intact  Insight:Shallow   Executive Functions  Concentration:Fair  Attention Span:Fair  Recall:Fair  Fund of Knowledge:Fair  Language:Fair   Psychomotor Activity  Psychomotor Activity:Psychomotor Activity: Increased;Restlessness   Assets  Assets:Physical Health;Housing;Social Support   Sleep  Sleep:Sleep: Fair   Physical Exam  Physical Exam Vitals and nursing note reviewed.  Constitutional:      Appearance: She is well-developed.  Cardiovascular:     Rate and Rhythm: Normal rate.  Pulmonary:     Effort: Pulmonary effort is normal.  Musculoskeletal:        General: Normal range of motion.  Skin:    General: Skin is warm.  Neurological:     Mental Status: She is alert and oriented to person, place, and time.    Review of Systems  Constitutional: Negative.   HENT: Negative.   Eyes: Negative.   Respiratory: Negative.   Cardiovascular: Negative.   Gastrointestinal: Negative.   Genitourinary: Negative.   Musculoskeletal: Negative.   Skin: Negative.   Neurological: Negative.   Endo/Heme/Allergies: Negative.   Psychiatric/Behavioral: Negative.    Blood pressure (!) 109/53, pulse 93, temperature 97.8 F (36.6 C), temperature source Temporal, resp. rate 16, height 4\' 9"  (1.448 m), weight 197 lb (89.4  kg), SpO2 98 %. Body mass index is 42.63 kg/m.  Demographic Factors:  Caucasian  Loss Factors: NA  Historical Factors: Prior suicide attempts and Impulsivity  Risk Reduction Factors:   Living with another person, especially a relative, Positive social support, Positive therapeutic relationship and Positive coping skills or problem solving skills  Continued Clinical Symptoms:  Previous Psychiatric Diagnoses and Treatments  Cognitive Features That Contribute To Risk:  None    Suicide  Risk:  Mild:  Suicidal ideation of limited frequency, intensity, duration, and specificity.  There are no identifiable plans, no associated intent, mild dysphoria and related symptoms, good self-control (both objective and subjective assessment), few other risk factors, and identifiable protective factors, including available and accessible social support.  Plan Of Care/Follow-up recommendations:  Continue activity as tolerated. Continue diet as recommended by your PCP. Ensure to keep all appointments with outpatient providers.  Disposition: Discharge back to group home  Maryfrances Bunnellravis B Ethelreda Sukhu, FNP 05/12/2020, 10:19 AM

## 2020-05-12 NOTE — ED Notes (Signed)
Patient discharged.

## 2020-05-12 NOTE — ED Notes (Signed)
Pt sleeping at present, no distress noted.  Monitoring for safety. 

## 2020-05-12 NOTE — ED Notes (Signed)
Pt was given cereal and chocolate milk, pt requested.

## 2020-05-12 NOTE — Discharge Instructions (Addendum)
Continue home medications as prescribed Follow up with [facility]

## 2020-05-12 NOTE — ED Notes (Signed)
Pt discharged in no acute distress. Reviewed AVS with pt and caregiver, by pt side. All verbalized understanding. Safety maintained.

## 2020-05-12 NOTE — ED Notes (Signed)
Writer received call from NP stating pt's transport to group home would be between 12p-2p today. Pt was informed and verbalized understanding. Will continue to monitor for safety.

## 2020-05-12 NOTE — ED Notes (Signed)
Pt tolerated all am medication. Denies SI but continue to endorse AH. Pt states, "it tell me to hurt myself and sometimes, it just laugh". Pt states recognizing that the voices aren't "real". Encouraged pt to notify staff with any needs or concerns. Pt verbalized understanding. Pt returned to laying position in bed with eyes closed. Safety maintained.

## 2020-05-12 NOTE — ED Notes (Signed)
Received Tami [name] this AM standing at the nurses station stating she hears voices after she ate breakfast. She returned to her chair bed and put her night mask on.

## 2020-05-12 NOTE — ED Notes (Signed)
RN spoke with [name], pt's guardian, to review discharge instructions for pt scheduled for discharge b/t 12p-2p today. Marcelle Smiling verbalized understanding of all instructions reviewed from the AVS. Awaiting pt's transport via group home facility transport bback to facility today. Safety maintained.

## 2020-07-21 ENCOUNTER — Encounter (HOSPITAL_COMMUNITY): Payer: Self-pay | Admitting: Oral Surgery

## 2020-07-22 ENCOUNTER — Other Ambulatory Visit: Payer: Self-pay

## 2020-07-22 ENCOUNTER — Encounter (HOSPITAL_COMMUNITY): Payer: Self-pay | Admitting: Oral Surgery

## 2020-07-22 NOTE — Progress Notes (Signed)
Your procedure is scheduled on September 22  Report to [facility] Main Entrance "A" at 0915 A.M., and check in at the Admitting office.  Call this number if you have problems the morning of surgery:  [phone]  Call [phone] if you have any questions prior to your surgery date Monday-Friday 8am-4pm    Remember:  Do not eat or drink after midnight the night before your surgery    Take these medicines the morning of surgery with A SIP OF WATER  AMITIZA atorvastatin (LIPITOR) divalproex (DEPAKOTE) hydrOXYzine (ATARAX/VISTARIL)  Iloperidone levothyroxine (SYNTHROID)  As of today, STOP taking any Aspirin (unless otherwise instructed by your surgeon) Aleve, Naproxen, Ibuprofen, Motrin, Advil, Goody's, BC's, all herbal medications, fish oil, and all vitamins.                      Do not wear jewelry, make up, or nail polish            Do not wear lotions, powders, perfumes/colognes, or deodorant.            Do not shave 48 hours prior to surgery.  Men may shave face and neck.            Do not bring valuables to the hospital.            [facility] is not responsible for any belongings or valuables.  Do NOT Smoke (Tobacco/Vaping) or drink Alcohol 24 hours prior to your procedure If you use a CPAP at night, you may bring all equipment for your overnight stay.   Contacts, glasses, dentures or bridgework may not be worn into surgery.      For patients admitted to the hospital, discharge time will be determined by your treatment team.   Patients discharged the day of surgery will not be allowed to drive home, and someone needs to stay with them for 24 hours.  Oral Hygiene is also important to reduce your risk of infection.  Remember - BRUSH YOUR TEETH THE MORNING OF SURGERY WITH YOUR REGULAR TOOTHPASTE  Day of Surgery: Wear Clean/Comfortable clothing the morning of surgery Do not apply any deodorants/lotions.   Remember to brush your teeth WITH YOUR REGULAR TOOTHPASTE.    Please read over the following fact sheets that you were given.

## 2020-07-22 NOTE — H&P (Signed)
HISTORY AND PHYSICAL  Kallen Tuleen Mandelbaum is a 29 y.o. female patient with CC: painful teeth  No diagnosis found.  Past Medical History:  Diagnosis Date  . ADHD (attention deficit hyperactivity disorder)   . Bipolar disorder (HCC)   . Chronic back pain   . Constipation   . HLD (hyperlipidemia)   . Hypothyroidism   . Seizures (HCC)     No current facility-administered medications for this encounter.   Current Outpatient Medications  Medication Sig Dispense Refill  . AMITIZA 24 MCG capsule Take 24 mcg by mouth 2 (two) times daily.    Marland Kitchen atorvastatin (LIPITOR) 40 MG tablet Take 40 mg by mouth daily.    . diclofenac Sodium (VOLTAREN) 1 % GEL Apply 4 g topically 2 (two) times daily.    . divalproex (DEPAKOTE) 500 MG DR tablet Take 1 tablet (500 mg total) by mouth every 12 (twelve) hours. 60 tablet 0  . hydrOXYzine (ATARAX/VISTARIL) 25 MG tablet Take 1 tablet (25 mg total) by mouth 3 (three) times daily as needed for anxiety. (Patient taking differently: Take 25-50 mg by mouth See admin instructions. Take 50 mg by mouth at bedtime for sleep and 25 mg three times a day as needed for Anxiety/ agitiation) 30 tablet 0  . ibuprofen (ADVIL) 800 MG tablet Take 800 mg by mouth daily as needed for mild pain or moderate pain. With food     . Iloperidone 2 MG TABS Take 2 tablets (4 mg total) by mouth 3 (three) times daily. 90 tablet 0  . levothyroxine (SYNTHROID) 25 MCG tablet Take 1 tablet (25 mcg total) by mouth daily at 6 (six) AM. 30 tablet 0  . medroxyPROGESTERone (DEPO-PROVERA) 150 MG/ML injection Inject 150 mg into the muscle every 3 (three) months. 13 Weeks    . Multiple Vitamin (MULTIVITAMIN WITH MINERALS) TABS tablet Take 1 tablet by mouth daily.    . traZODone (DESYREL) 100 MG tablet Take 2 tablets (200 mg total) by mouth at bedtime as needed for sleep. 30 tablet 0  . nicotine polacrilex (NICORETTE) 2 MG gum Take 1 each (2 mg total) by mouth as needed for smoking cessation. (Patient not  taking: Reported on 05/12/2020) 100 tablet 0   Allergies  Allergen Reactions  . Benadryl [Diphenhydramine]     Itch   . Penicillins     Hives    Active Problems:   * No active hospital problems. *  Vitals: There were no vitals taken for this visit. Lab results:No results found for this or any previous visit (from the past 24 hour(s)). Radiology Results: No results found. General appearance: alert, cooperative and morbidly obese Head: Normocephalic, without obvious abnormality, atraumatic Eyes: negative Nose: Nares normal. Septum midline. Mucosa normal. No drainage or sinus tenderness. Throat: multiple carious teeth # 1, 2, 15, 16, 17, 18, 19, 32. No purulence, edema, fluctuance or trismus. Pharynx clear. Neck: no adenopathy and supple, symmetrical, trachea midline Resp: clear to auscultation bilaterally Cardio: regular rate and rhythm, S1, S2 normal, no murmur, click, rub or gallop  Assessment:  Multiple carious teeth  Plan: Multiple dental extractions. GA Day surgery.   Ocie Doyne 07/22/2020

## 2020-07-22 NOTE — Progress Notes (Signed)
Your procedure is scheduled on September 22  Report to [facility] Main Entrance "A" at 0915 A.M., and check in at the Admitting office.  Call this number if you have problems the morning of surgery:  [phone]  >>>>>Please send patient's Medication Record with medications administrated documentation. ( this information is required prior to OR. This includes medications that may have been on hold for surgery)<<<<<  Remember:  Do not eat or drink after midnight the night before your surgery    Take these medicines the morning of surgery with A SIP OF WATER  AMITIZA atorvastatin (LIPITOR) divalproex (DEPAKOTE) hydrOXYzine (ATARAX/VISTARIL)  Iloperidone levothyroxine (SYNTHROID)  As of today, STOP taking any Aspirin (unless otherwise instructed by your surgeon) Aleve, Naproxen, Ibuprofen, Motrin, Advil, Goody's, BC's, all herbal medications, fish oil, and all vitamins.             Shower with antibacteria soap.            Do not wear lotions, powders, perfumes/colognes, or deodorant.            Wear Clean/Comfortable clothing the morning of surgery            Do not wear jewelry, make up.            Do not shave 48 hours prior to surgery.              Do not bring valuables to the hospital.            [facility] is not responsible for any belongings or valuables.    Oral Hygiene is also important to reduce your risk of infection.  Remember - BRUSH YOUR TEETH THE MORNING OF SURGERY WITH YOUR REGULAR TOOTHPASTE

## 2020-07-22 NOTE — Progress Notes (Signed)
I spoke with Ms. Montogomery at [employer]. Ms Montogomery reports that Ms Cassel has not had any c/o chest pain or shortness of breath.  Ms Eckart will be tested for Covid on arrival in am. Ms. Hamilton Capri reports that patient's consent was signed by [name], an employee of [employer]., Dr. Barbette Merino has the consent.  Guardianship papers will be sent with patient.  Guardianship papers has [employer] list as Guardian.  Ms Montogomory reports that Ms. Brew was recently diagnosed with type II diabetes, patient has not been started on medication or CBGs, it is being approved. I requested records from PCP's office Dr. Carlisle Beers.

## 2020-07-23 ENCOUNTER — Encounter (HOSPITAL_COMMUNITY)
Admission: RE | Disposition: A | Discharge status: Home / Self Care | Payer: Self-pay | Source: Home / Self Care | Attending: Oral Surgery

## 2020-07-23 ENCOUNTER — Ambulatory Visit (HOSPITAL_COMMUNITY)
Admission: RE | Admit: 2020-07-23 | Discharge: 2020-07-23 | Disposition: A | Discharge status: Home / Self Care | Payer: Medicaid Other | Attending: Oral Surgery | Admitting: Oral Surgery

## 2020-07-23 ENCOUNTER — Ambulatory Visit (HOSPITAL_COMMUNITY): Payer: Medicaid Other | Admitting: Anesthesiology

## 2020-07-23 ENCOUNTER — Encounter (HOSPITAL_COMMUNITY): Payer: Self-pay | Admitting: Oral Surgery

## 2020-07-23 DIAGNOSIS — M549 Dorsalgia, unspecified: Secondary | ICD-10-CM | POA: Diagnosis not present

## 2020-07-23 DIAGNOSIS — E039 Hypothyroidism, unspecified: Secondary | ICD-10-CM | POA: Insufficient documentation

## 2020-07-23 DIAGNOSIS — R569 Unspecified convulsions: Secondary | ICD-10-CM | POA: Diagnosis not present

## 2020-07-23 DIAGNOSIS — Z791 Long term (current) use of non-steroidal anti-inflammatories (NSAID): Secondary | ICD-10-CM | POA: Diagnosis not present

## 2020-07-23 DIAGNOSIS — F209 Schizophrenia, unspecified: Secondary | ICD-10-CM | POA: Diagnosis not present

## 2020-07-23 DIAGNOSIS — Z20822 Contact with and (suspected) exposure to covid-19: Secondary | ICD-10-CM | POA: Diagnosis not present

## 2020-07-23 DIAGNOSIS — Z87891 Personal history of nicotine dependence: Secondary | ICD-10-CM | POA: Insufficient documentation

## 2020-07-23 DIAGNOSIS — Z793 Long term (current) use of hormonal contraceptives: Secondary | ICD-10-CM | POA: Diagnosis not present

## 2020-07-23 DIAGNOSIS — Z79899 Other long term (current) drug therapy: Secondary | ICD-10-CM | POA: Insufficient documentation

## 2020-07-23 DIAGNOSIS — K029 Dental caries, unspecified: Secondary | ICD-10-CM | POA: Insufficient documentation

## 2020-07-23 DIAGNOSIS — G8929 Other chronic pain: Secondary | ICD-10-CM | POA: Diagnosis not present

## 2020-07-23 DIAGNOSIS — E785 Hyperlipidemia, unspecified: Secondary | ICD-10-CM | POA: Insufficient documentation

## 2020-07-23 DIAGNOSIS — F909 Attention-deficit hyperactivity disorder, unspecified type: Secondary | ICD-10-CM | POA: Diagnosis not present

## 2020-07-23 DIAGNOSIS — Z888 Allergy status to other drugs, medicaments and biological substances status: Secondary | ICD-10-CM | POA: Insufficient documentation

## 2020-07-23 DIAGNOSIS — F319 Bipolar disorder, unspecified: Secondary | ICD-10-CM | POA: Insufficient documentation

## 2020-07-23 DIAGNOSIS — E119 Type 2 diabetes mellitus without complications: Secondary | ICD-10-CM | POA: Insufficient documentation

## 2020-07-23 DIAGNOSIS — F419 Anxiety disorder, unspecified: Secondary | ICD-10-CM | POA: Insufficient documentation

## 2020-07-23 DIAGNOSIS — K5900 Constipation, unspecified: Secondary | ICD-10-CM | POA: Insufficient documentation

## 2020-07-23 DIAGNOSIS — Z6841 Body Mass Index (BMI) 40.0 and over, adult: Secondary | ICD-10-CM | POA: Insufficient documentation

## 2020-07-23 DIAGNOSIS — Z880 Allergy status to penicillin: Secondary | ICD-10-CM | POA: Diagnosis not present

## 2020-07-23 HISTORY — DX: Constipation, unspecified: K59.00

## 2020-07-23 HISTORY — DX: Anxiety disorder, unspecified: F41.9

## 2020-07-23 HISTORY — DX: Hyperlipidemia, unspecified: E78.5

## 2020-07-23 HISTORY — DX: Hypothyroidism, unspecified: E03.9

## 2020-07-23 HISTORY — DX: Type 2 diabetes mellitus without complications: E11.9

## 2020-07-23 HISTORY — DX: Depression, unspecified: F32.A

## 2020-07-23 HISTORY — DX: Developmental disorder of scholastic skills, unspecified: F81.9

## 2020-07-23 HISTORY — PX: TOOTH EXTRACTION: SHX859

## 2020-07-23 LAB — BASIC METABOLIC PANEL
Anion gap: 12 (ref 5–15)
BUN: 13 mg/dL (ref 6–20)
CO2: 20 mmol/L — ABNORMAL LOW (ref 22–32)
Calcium: 9 mg/dL (ref 8.9–10.3)
Chloride: 104 mmol/L (ref 98–111)
Creatinine, Ser: 0.67 mg/dL (ref 0.44–1.00)
GFR calc Af Amer: >60 mL/min (ref >60)
GFR calc non Af Amer: >60 mL/min (ref >60)
Glucose, Bld: 90 mg/dL (ref 70–99)
Potassium: 4.1 mmol/L (ref 3.5–5.1)
Sodium: 136 mmol/L (ref 135–145)

## 2020-07-23 LAB — GLUCOSE, CAPILLARY
Glucose-Capillary: 94 mg/dL (ref 70–99)
Glucose-Capillary: 86 mg/dL (ref 70–99)
Glucose-Capillary: 125 mg/dL — ABNORMAL HIGH (ref 70–99)

## 2020-07-23 LAB — CBC
HCT: 44.7 % (ref 36.0–46.0)
Hemoglobin: 13.6 g/dL (ref 12.0–15.0)
MCH: 26 pg (ref 26.0–34.0)
MCHC: 30.4 g/dL (ref 30.0–36.0)
MCV: 85.5 fL (ref 80.0–100.0)
Platelets: 294 10*3/uL (ref 150–400)
RBC: 5.23 MIL/uL — ABNORMAL HIGH (ref 3.87–5.11)
RDW: 14.2 % (ref 11.5–15.5)
WBC: 6.7 10*3/uL (ref 4.0–10.5)
nRBC: 0 % (ref 0.0–0.2)

## 2020-07-23 LAB — POCT PREGNANCY, URINE: Preg Test, Ur: NEGATIVE

## 2020-07-23 LAB — SARS CORONAVIRUS 2 BY RT PCR (HOSPITAL ORDER, PERFORMED IN ~~LOC~~ HOSPITAL LAB): SARS Coronavirus 2: NEGATIVE

## 2020-07-23 SURGERY — DENTAL RESTORATION/EXTRACTIONS
Anesthesia: General | Site: Mouth

## 2020-07-23 MED ORDER — SUGAMMADEX SODIUM 200 MG/2ML IV SOLN
INTRAVENOUS | Status: DC | PRN
  Administered 2020-07-23: 200 mg via INTRAVENOUS

## 2020-07-23 MED ORDER — ORAL CARE MOUTH RINSE: 15.0000 mL | Freq: Once | OROMUCOSAL | Status: AC

## 2020-07-23 MED ORDER — SODIUM CHLORIDE 0.9 % IR SOLN
Status: DC | PRN
  Administered 2020-07-23: 1000 mL

## 2020-07-23 MED ORDER — FENTANYL CITRATE (PF) 100 MCG/2ML IJ SOLN: 25.0000 ug | INTRAMUSCULAR | Status: DC | PRN

## 2020-07-23 MED ORDER — OXYCODONE HCL 5 MG PO TABS: 5.0000 mg | ORAL_TABLET | Freq: Once | ORAL | Status: DC

## 2020-07-23 MED ORDER — LIDOCAINE 2% (20 MG/ML) 5 ML SYRINGE
INTRAMUSCULAR | Status: DC | PRN
  Administered 2020-07-23: 80 mg via INTRAVENOUS

## 2020-07-23 MED ORDER — CHLORHEXIDINE GLUCONATE 0.12 % MT SOLN
15.0000 mL | Freq: Once | OROMUCOSAL | Status: AC
  Administered 2020-07-23: 15 mL via OROMUCOSAL
  Filled 2020-07-23: qty 15

## 2020-07-23 MED ORDER — MIDAZOLAM HCL 2 MG/2ML IJ SOLN
INTRAMUSCULAR | Status: AC
  Filled 2020-07-23: qty 2

## 2020-07-23 MED ORDER — HYDROCODONE-ACETAMINOPHEN 5-325 MG PO TABS
1.0000 | ORAL_TABLET | Freq: Four times a day (QID) | ORAL | 0 refills | Status: DC | PRN
Start: 2020-07-23 — End: 2020-11-23

## 2020-07-23 MED ORDER — FENTANYL CITRATE (PF) 100 MCG/2ML IJ SOLN
INTRAMUSCULAR | Status: AC
  Administered 2020-07-23: 25 ug via INTRAVENOUS
  Filled 2020-07-23: qty 2

## 2020-07-23 MED ORDER — PROPOFOL 10 MG/ML IV BOLUS
INTRAVENOUS | Status: AC
  Filled 2020-07-23: qty 40

## 2020-07-23 MED ORDER — LIDOCAINE-EPINEPHRINE 2 %-1:100000 IJ SOLN
INTRAMUSCULAR | Status: DC | PRN
  Administered 2020-07-23: 10 mL via INTRADERMAL

## 2020-07-23 MED ORDER — 0.9 % SODIUM CHLORIDE (POUR BTL) OPTIME
TOPICAL | Status: DC | PRN
  Administered 2020-07-23: 1000 mL

## 2020-07-23 MED ORDER — OXYMETAZOLINE HCL 0.05 % NA SOLN
NASAL | Status: DC | PRN
  Administered 2020-07-23: 1

## 2020-07-23 MED ORDER — CLINDAMYCIN PHOSPHATE 900 MG/50ML IV SOLN
900.0000 mg | INTRAVENOUS | Status: AC
  Administered 2020-07-23: 900 mg via INTRAVENOUS
  Filled 2020-07-23: qty 50

## 2020-07-23 MED ORDER — CLINDAMYCIN HCL 300 MG PO CAPS: 300.0000 mg | ORAL_CAPSULE | Freq: Three times a day (TID) | ORAL | 0 refills | Status: DC

## 2020-07-23 MED ORDER — FENTANYL CITRATE (PF) 250 MCG/5ML IJ SOLN
INTRAMUSCULAR | Status: AC
  Filled 2020-07-23: qty 5

## 2020-07-23 MED ORDER — ONDANSETRON HCL 4 MG/2ML IJ SOLN
INTRAMUSCULAR | Status: DC | PRN
  Administered 2020-07-23: 4 mg via INTRAVENOUS

## 2020-07-23 MED ORDER — PROPOFOL 10 MG/ML IV BOLUS
INTRAVENOUS | Status: DC | PRN
  Administered 2020-07-23: 200 mg via INTRAVENOUS

## 2020-07-23 MED ORDER — OXYCODONE HCL 5 MG PO TABS
ORAL_TABLET | ORAL | Status: AC
  Filled 2020-07-23: qty 1

## 2020-07-23 MED ORDER — ROCURONIUM BROMIDE 10 MG/ML (PF) SYRINGE
PREFILLED_SYRINGE | INTRAVENOUS | Status: DC | PRN
  Administered 2020-07-23: 80 mg via INTRAVENOUS

## 2020-07-23 MED ORDER — MIDAZOLAM HCL 5 MG/5ML IJ SOLN
INTRAMUSCULAR | Status: DC | PRN
  Administered 2020-07-23: 2 mg via INTRAVENOUS

## 2020-07-23 MED ORDER — LACTATED RINGERS IV SOLN: INTRAVENOUS | Status: DC

## 2020-07-23 MED ORDER — FENTANYL CITRATE (PF) 100 MCG/2ML IJ SOLN
INTRAMUSCULAR | Status: DC | PRN
Start: 2020-07-23 — End: 2020-07-23
  Administered 2020-07-23: 100 ug via INTRAVENOUS
  Administered 2020-07-23: 50 ug via INTRAVENOUS

## 2020-07-23 MED ORDER — DEXAMETHASONE SODIUM PHOSPHATE 10 MG/ML IJ SOLN
INTRAMUSCULAR | Status: DC | PRN
  Administered 2020-07-23: 5 mg via INTRAVENOUS

## 2020-07-23 SURGICAL SUPPLY — 37 items
BLADE SURG 15 STRL LF DISP TIS (BLADE) ×1 IMPLANT
BLADE SURG 15 STRL SS (BLADE) ×1
BUR CROSS CUT FISSURE 1.6 (BURR) ×2 IMPLANT
BUR EGG ELITE 4.0 (BURR) ×2 IMPLANT
CANISTER SUCT 3000ML PPV (MISCELLANEOUS) ×2 IMPLANT
COVER SURGICAL LIGHT HANDLE (MISCELLANEOUS) ×2 IMPLANT
COVER WAND RF STERILE (DRAPES) IMPLANT
DECANTER SPIKE VIAL GLASS SM (MISCELLANEOUS) ×2 IMPLANT
DRAPE U-SHAPE 76X120 STRL (DRAPES) ×2 IMPLANT
GAUZE PACKING FOLDED 2  STR (GAUZE/BANDAGES/DRESSINGS) ×1
GAUZE PACKING FOLDED 2 STR (GAUZE/BANDAGES/DRESSINGS) ×1 IMPLANT
GLOVE BIO SURGEON STRL SZ 6.5 (GLOVE) IMPLANT
GLOVE BIO SURGEON STRL SZ7 (GLOVE) IMPLANT
GLOVE BIO SURGEON STRL SZ8 (GLOVE) ×2 IMPLANT
GLOVE BIOGEL PI IND STRL 6.5 (GLOVE) IMPLANT
GLOVE BIOGEL PI IND STRL 7.0 (GLOVE) IMPLANT
GLOVE BIOGEL PI INDICATOR 6.5 (GLOVE)
GLOVE BIOGEL PI INDICATOR 7.0 (GLOVE)
GOWN STRL REUS W/ TWL LRG LVL3 (GOWN DISPOSABLE) ×1 IMPLANT
GOWN STRL REUS W/ TWL XL LVL3 (GOWN DISPOSABLE) ×1 IMPLANT
GOWN STRL REUS W/TWL LRG LVL3 (GOWN DISPOSABLE) ×1
GOWN STRL REUS W/TWL XL LVL3 (GOWN DISPOSABLE) ×1
IV NS 1000ML (IV SOLUTION) ×1
IV NS 1000ML BAXH (IV SOLUTION) ×1 IMPLANT
KIT BASIN OR (CUSTOM PROCEDURE TRAY) ×2 IMPLANT
KIT TURNOVER KIT B (KITS) ×2 IMPLANT
NDL HYPO 25GX1X1/2 BEV (NEEDLE) ×2 IMPLANT
NEEDLE HYPO 25GX1X1/2 BEV (NEEDLE) ×4 IMPLANT
NS IRRIG 1000ML POUR BTL (IV SOLUTION) ×2 IMPLANT
PAD ARMBOARD 7.5X6 YLW CONV (MISCELLANEOUS) ×2 IMPLANT
SLEEVE IRRIGATION ELITE 7 (MISCELLANEOUS) ×2 IMPLANT
SPONGE SURGIFOAM ABS GEL 12-7 (HEMOSTASIS) IMPLANT
SUT CHROMIC 3 0 PS 2 (SUTURE) ×2 IMPLANT
SYR CONTROL 10ML LL (SYRINGE) ×2 IMPLANT
TRAY ENT MC OR (CUSTOM PROCEDURE TRAY) ×2 IMPLANT
TUBING IRRIGATION (MISCELLANEOUS) ×2 IMPLANT
YANKAUER SUCT BULB TIP NO VENT (SUCTIONS) ×2 IMPLANT

## 2020-07-23 NOTE — Op Note (Signed)
[name], [name] [facility] MEDICAL RECORD [id number] ACCOUNT [id number] DATE OF BIRTH:[birth year] FACILITY: [facility] LOCATION: [facility] PHYSICIAN:Keamber Macfadden M. Yohannes Waibel, DDS  OPERATIVE REPORT  DATE OF PROCEDURE:  07/23/2020  PREOPERATIVE DIAGNOSIS:  Nonrestorable teeth secondary to dental caries, numbers 1, 2, 15, 16, 17, 19, 18, 32.  PROCEDURE:  Extraction of teeth numbers 1, 2, 15, 16, 17, 18 19, 32.  SURGEON:  Ocie Doyne, DDS  ANESTHESIA:  General, Dr. Donavan Foil attending, nasal intubation.  DESCRIPTION PROCEDURE:  The patient was taken to the operating room and placed on the table in supine position.  General anesthesia was administered.  A nasoendotracheal tube was placed and secured.  The eyes were protected and the patient was draped for  surgery.  A timeout was performed.  The posterior pharynx was suctioned and a throat pack was placed. Two percent lidocaine, 1:100,000 epinephrine was infiltrated in an inferior alveolar block on the right and left sides and then buccal and palatal  infiltration around the maxillary teeth to be removed.  A total of 10 mL was given.  A bite block was placed on the right side of the mouth and a sweetheart retractor was used to retract the tongue.  A #15 blade was used to make an incision around teeth  numbers 17, 18, 19 in the buccal and lingual sulcus and around teeth numbers 15 and 16 in the buccal and palatal sulcus.  The periosteum was reflected from around these teeth.  The teeth were elevated with a 301 elevator and removed from the mouth with  the dental forceps.  The sockets were curetted, irrigated, and closed with 3-0 chromic.  The bite block and sweetheart retractor was repositioned to the other side of the mouth.  The 15 blade was used to make an incision around tooth #32 and around teeth  numbers 1 and 2 in the maxilla.  The periosteum was reflected.  The teeth were elevated and removed with the dental forceps.  The sockets were curetted, irrigated,  and closed with 3-0 chromic.  Then, the oral cavity was irrigated and suctioned.  The  throat pack was removed.  The patient was left in care of anesthesia for extubation and transport to recovery room with plans for discharge home through day surgery.  ESTIMATED BLOOD LOSS:  Minimal.  COMPLICATIONS:  None.  SPECIMENS:  None.  VN/NUANCE  D:07/23/2020 T:07/23/2020 JOB:[id number]

## 2020-07-23 NOTE — Anesthesia Preprocedure Evaluation (Addendum)
Anesthesia Evaluation  Patient identified by MRN, date of birth, ID band  Reviewed: Allergy & Precautions, NPO status , Patient's Chart, lab work & pertinent test results  Airway Mallampati: II  TM Distance: >3 FB Neck ROM: Full   Comment: Small nares Dental no notable dental hx.    Pulmonary former smoker,    Pulmonary exam normal breath sounds clear to auscultation       Cardiovascular Exercise Tolerance: Good Normal cardiovascular exam Rhythm:Regular Rate:Normal     Neuro/Psych Seizures -,  PSYCHIATRIC DISORDERS Anxiety Depression Bipolar Disorder Schizophrenia Cognitive delay   GI/Hepatic negative GI ROS, Neg liver ROS,   Endo/Other  diabetes, Type 2Hypothyroidism Morbid obesity  Renal/GU   negative genitourinary   Musculoskeletal negative musculoskeletal ROS (+)   Abdominal   Peds negative pediatric ROS (+)  Hematology   Anesthesia Other Findings Chronic back  And leg pain  Reproductive/Obstetrics                            Anesthesia Physical Anesthesia Plan  ASA: III  Anesthesia Plan: General   Post-op Pain Management:    Induction: Intravenous  PONV Risk Score and Plan: 3 and Midazolam, Dexamethasone and Ondansetron  Airway Management Planned: Nasal ETT  Additional Equipment:   Intra-op Plan:   Post-operative Plan: Extubation in OR  Informed Consent: I have reviewed the patients History and Physical, chart, labs and discussed the procedure including the risks, benefits and alternatives for the proposed anesthesia with the patient or authorized representative who has indicated his/her understanding and acceptance.     Dental advisory given  Plan Discussed with: CRNA, Anesthesiologist and Surgeon  Anesthesia Plan Comments:         Anesthesia Quick Evaluation

## 2020-07-23 NOTE — Transfer of Care (Signed)
Immediate Anesthesia Transfer of Care Note  Patient: Tami [name]  Procedure(s) Performed: DENTAL RESTORATION/EXTRACTIONS (N/A Mouth)  Patient Location: PACU  Anesthesia Type:General  Level of Consciousness: awake, alert  and oriented  Airway & Oxygen Therapy: Patient Spontanous Breathing  Post-op Assessment: Report given to RN and Post -op Vital signs reviewed and stable  Post vital signs: Reviewed and stable  Last Vitals:  Vitals Value Taken Time  BP 136/120 07/23/20 1328  Temp    Pulse 108 07/23/20 1335  Resp 16 07/23/20 1335  SpO2 95 % 07/23/20 1335  Vitals shown include unvalidated device data.  Last Pain:  Vitals:   07/23/20 1005  TempSrc:   PainSc: 8          Complications: No complications documented.

## 2020-07-23 NOTE — Anesthesia Postprocedure Evaluation (Signed)
Anesthesia Post Note  Patient: Tami [name]  Procedure(s) Performed: DENTAL RESTORATION/EXTRACTIONS (N/A Mouth)     Patient location during evaluation: PACU Anesthesia Type: General Level of consciousness: awake and alert Pain management: pain level controlled Vital Signs Assessment: post-procedure vital signs reviewed and stable Respiratory status: spontaneous breathing, nonlabored ventilation, respiratory function stable and patient connected to nasal cannula oxygen Cardiovascular status: blood pressure returned to baseline and stable Postop Assessment: no apparent nausea or vomiting Anesthetic complications: no   No complications documented.  Last Vitals:  Vitals:   07/23/20 1400 07/23/20 1415  BP: 117/72 124/72  Pulse: 97 98  Resp: (!) 9 (!) 24  Temp: 37.1 C   SpO2: 91% 93%    Last Pain:  Vitals:   07/23/20 1355  TempSrc:   PainSc: 7                  [name]

## 2020-07-23 NOTE — Op Note (Signed)
07/23/2020  1:00 PM  PATIENT:  Tami [name]  29 y.o. female  PRE-OPERATIVE DIAGNOSIS:  NON RESTORABLE TEETH # 1, 2, 15, 16, 17, 18, 19, 32  POST-OPERATIVE DIAGNOSIS:  SAME  PROCEDURE:  Procedure(s):EXTRACTIONS TEETH # 1, 2, 15, 16, 17, 18, 19, 32  SURGEON:  Surgeon(s): Ocie Doyne, DDS  ANESTHESIA:   local and general  EBL:  minimal  DRAINS: none   SPECIMEN:  No Specimen  COUNTS:  YES  PLAN OF CARE: Discharge to home after PACU  PATIENT DISPOSITION:  PACU - hemodynamically stable.   PROCEDURE DETAILS: Dictation # [id number]  Tami [name], DMD 07/23/2020 1:00 PM

## 2020-07-23 NOTE — Anesthesia Procedure Notes (Signed)
Procedure Name: Intubation Date/Time: 07/23/2020 12:42 PM Performed by: Gwenyth Allegra, CRNA Pre-anesthesia Checklist: Patient identified, Emergency Drugs available, Suction available and Patient being monitored Patient Re-evaluated:Patient Re-evaluated prior to induction Oxygen Delivery Method: Circle System Utilized Preoxygenation: Pre-oxygenation with 100% oxygen Induction Type: IV induction Ventilation: Mask ventilation without difficulty and Nasal airway inserted- appropriate to patient size Laryngoscope Size: Glidescope and 3 Grade View: Grade I Tube type: Oral Nasal Tubes: Left, Nasal prep performed and Nasal Rae Tube size: 7.0 mm Number of attempts: 1 Airway Equipment and Method: Stylet and Oral airway Placement Confirmation: ETT inserted through vocal cords under direct vision,  positive ETCO2 and breath sounds checked- equal and bilateral Tube secured with: Tape Dental Injury: Teeth and Oropharynx as per pre-operative assessment  Comments: DL with miller 2, grade 1 view, ETT lined up with glottic opening but unable to pass through vocal cords. Patient mask ventilated. Second DL with glide scope, grade 1 view, ETT pass through vocal cords. Recommend 6.5 for future nasal intubations. Small anterior glottic opening.

## 2020-07-23 NOTE — H&P (Signed)
H&P documentation  -History and Physical Reviewed  -Patient has been re-examined  -No change in the plan of care  Tami [name]  

## 2020-07-24 ENCOUNTER — Encounter (HOSPITAL_COMMUNITY): Payer: Self-pay | Admitting: Oral Surgery

## 2020-08-07 ENCOUNTER — Emergency Department (HOSPITAL_COMMUNITY)
Admission: EM | Admit: 2020-08-07 | Discharge: 2020-08-09 | Disposition: A | Discharge status: Home / Self Care | Payer: Medicaid Other | Attending: Emergency Medicine | Admitting: Emergency Medicine

## 2020-08-07 ENCOUNTER — Other Ambulatory Visit: Payer: Self-pay

## 2020-08-07 ENCOUNTER — Encounter (HOSPITAL_COMMUNITY): Payer: Self-pay

## 2020-08-07 DIAGNOSIS — E039 Hypothyroidism, unspecified: Secondary | ICD-10-CM | POA: Insufficient documentation

## 2020-08-07 DIAGNOSIS — R45851 Suicidal ideations: Secondary | ICD-10-CM | POA: Insufficient documentation

## 2020-08-07 DIAGNOSIS — Z20822 Contact with and (suspected) exposure to covid-19: Secondary | ICD-10-CM | POA: Diagnosis not present

## 2020-08-07 DIAGNOSIS — Z79899 Other long term (current) drug therapy: Secondary | ICD-10-CM | POA: Diagnosis not present

## 2020-08-07 DIAGNOSIS — F333 Major depressive disorder, recurrent, severe with psychotic symptoms: Secondary | ICD-10-CM | POA: Diagnosis not present

## 2020-08-07 DIAGNOSIS — Z87891 Personal history of nicotine dependence: Secondary | ICD-10-CM | POA: Insufficient documentation

## 2020-08-07 DIAGNOSIS — E119 Type 2 diabetes mellitus without complications: Secondary | ICD-10-CM | POA: Insufficient documentation

## 2020-08-07 LAB — COMPREHENSIVE METABOLIC PANEL
ALT: 30 U/L (ref 0–44)
AST: 17 U/L (ref 15–41)
Albumin: 3.4 g/dL — ABNORMAL LOW (ref 3.5–5.0)
Alkaline Phosphatase: 104 U/L (ref 38–126)
Anion gap: 12 (ref 5–15)
BUN: 8 mg/dL (ref 6–20)
CO2: 20 mmol/L — ABNORMAL LOW (ref 22–32)
Calcium: 9.3 mg/dL (ref 8.9–10.3)
Chloride: 108 mmol/L (ref 98–111)
Creatinine, Ser: 0.53 mg/dL (ref 0.44–1.00)
GFR calc non Af Amer: >60 mL/min (ref >60)
Glucose, Bld: 125 mg/dL — ABNORMAL HIGH (ref 70–99)
Potassium: 4 mmol/L (ref 3.5–5.1)
Sodium: 140 mmol/L (ref 135–145)
Total Bilirubin: 0.3 mg/dL (ref 0.3–1.2)
Total Protein: 6.6 g/dL (ref 6.5–8.1)

## 2020-08-07 LAB — CBC
HCT: 40.2 % (ref 36.0–46.0)
Hemoglobin: 12.6 g/dL (ref 12.0–15.0)
MCH: 26.2 pg (ref 26.0–34.0)
MCHC: 31.3 g/dL (ref 30.0–36.0)
MCV: 83.6 fL (ref 80.0–100.0)
Platelets: 260 10*3/uL (ref 150–400)
RBC: 4.81 MIL/uL (ref 3.87–5.11)
RDW: 14.1 % (ref 11.5–15.5)
WBC: 7.8 10*3/uL (ref 4.0–10.5)
nRBC: 0 % (ref 0.0–0.2)

## 2020-08-07 LAB — SALICYLATE LEVEL: Salicylate Lvl: <7 mg/dL — ABNORMAL LOW (ref 7.0–30.0)

## 2020-08-07 LAB — I-STAT BETA HCG BLOOD, ED (MC, WL, AP ONLY): I-stat hCG, quantitative: <5 m[IU]/mL (ref <5)

## 2020-08-07 LAB — RAPID URINE DRUG SCREEN, HOSP PERFORMED
Amphetamines: NOT DETECTED
Barbiturates: NOT DETECTED
Benzodiazepines: NOT DETECTED
Cocaine: NOT DETECTED
Opiates: NOT DETECTED
Tetrahydrocannabinol: NOT DETECTED

## 2020-08-07 LAB — ETHANOL: Alcohol, Ethyl (B): <10 mg/dL (ref <10)

## 2020-08-07 LAB — ACETAMINOPHEN LEVEL: Acetaminophen (Tylenol), Serum: <10 ug/mL — ABNORMAL LOW (ref 10–30)

## 2020-08-07 NOTE — ED Triage Notes (Signed)
Pt lives in group home, reports SI since yesterday with plan to cut herself with razor blades. Pt calm and cooperative in triage.

## 2020-08-08 LAB — RESPIRATORY PANEL BY RT PCR (FLU A&B, COVID)
Influenza A by PCR: NEGATIVE
Influenza B by PCR: NEGATIVE
SARS Coronavirus 2 by RT PCR: NEGATIVE

## 2020-08-08 MED ORDER — LORAZEPAM 1 MG PO TABS
0.5000 mg | ORAL_TABLET | Freq: Once | ORAL | Status: AC
  Administered 2020-08-08: 0.5 mg via ORAL
  Filled 2020-08-08: qty 1

## 2020-08-08 MED ORDER — LORAZEPAM 1 MG PO TABS
1.0000 mg | ORAL_TABLET | Freq: Once | ORAL | Status: AC
  Administered 2020-08-08: 1 mg via ORAL
  Filled 2020-08-08: qty 1

## 2020-08-08 NOTE — ED Provider Notes (Signed)
Brief update note  29 year old lady who presented to ER with suicidal ideations.  Initial TTS consult recommending inpatient placement.  Day team however recommended that patient be discharged back to her group home.  Prior to discharge back to group home, patient reported ongoing suicidal ideations.  When I assessed patient, she reports concern for SI with thoughts of stabbing her self in the throat with a pen.  Patient is calm, cooperative, not responding to internal stimuli, concern this is more likely SI and concern actual intent, concern for possible malingering behavior, secondary gain.  Nevertheless, given her symptoms I believe it would be prudent to have psychiatry reassess patient.  I reviewed case with the nurse practitioner on-call for psychiatry and she recommended getting TTS to perform another assessment prior to dispo.   Tami Loll, MD 08/08/20 [phone]
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
## 2020-08-08 NOTE — ED Notes (Signed)
This RN place an order for an 1-1 sitter and also called staffing requesting one, Per staffing " they do not have a sitter available at this time"

## 2020-08-08 NOTE — ED Notes (Signed)
Ordered breakfast--Tami [name] 

## 2020-08-08 NOTE — BH Assessment (Signed)
This therapist informed and was acknowledged by Karren Cobble, RN of pt's disposition. RN agreed to inform EDP of disposition.   Dolores Frame, MSW, LCSW-A Triage Specialist [phone]

## 2020-08-08 NOTE — ED Provider Notes (Signed)
[facility] EMERGENCY DEPARTMENT Provider Note   CSN: [id number] Arrival date & time: 08/07/20  1853     History Chief Complaint  Patient presents with  . Suicidal    Tami [name] is a 29 y.o. female.  Patient presents to the emergency department with a chief complaint of suicidal thoughts.  She states that she wanted to cut her wrists with a razor blade today.  She states that someone walked in on her while she was about to do it.  She states that she has tried to overdose in the past.  She states that she does not feel like she can go on in life.  She denies recent drug or alcohol use.  She denies any recent illnesses.  Denies any other associated symptoms.  The history is provided by the patient. No language interpreter was used.       Past Medical History:  Diagnosis Date  . ADHD (attention deficit hyperactivity disorder)   . Anxiety   . Bipolar disorder (HCC)   . Chronic back pain   . Cognitive developmental delay   . Constipation   . Depression   . Diabetes mellitus without complication (HCC)    Type II - diabetes- 07/22/20- hasn't started on medicaion  . HLD (hyperlipidemia)   . Hypothyroidism   . Seizures [facility])     Patient Active Problem List   Diagnosis Date Noted  . 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 Homicidal thoughts 10/25/2019  . MDD (major depressive disorder) 10/25/2019  . Bipolar affective disorder, current episode mixed (HCC) 07/28/2017  . Subacute confusional state 07/21/2015  . Memory loss 07/21/2015  . Agitation 07/21/2015  . Altered mental status 07/21/2015  . Bipolar disorder (HCC) 09/04/2013  . Thought disorder 09/02/2013  . Marijuana dependence (HCC) 09/02/2013  . Cocaine abuse (HCC) 09/02/2013  . Tobacco dependence 09/02/2013    Past Surgical History:  Procedure Laterality Date  . THROAT SURGERY     throat surgery when younger doesn't know  why  . TOOTH EXTRACTION N/A 07/23/2020   Procedure: DENTAL RESTORATION/EXTRACTIONS;  Surgeon: Ocie Doyne, DDS;  Location: [facility] OR;  Service: Oral Surgery;  Laterality: N/A;     OB History   No obstetric history on file.     Family History  Problem Relation Age of Onset  . Dementia Neg Hx     Social History   Tobacco Use  . Smoking status: Former Smoker    Packs/day: 0.25    Years: 4.00    Pack years: 1.00    Types: Cigarettes    Quit date: 2019    Years since quitting: 2.7  . Smokeless tobacco: Never Used  . Tobacco comment: " may smoke when she goes home."  Substance Use Topics  . Alcohol use: No    Alcohol/week: 0.0 standard drinks  . Drug use: No    Home Medications Prior to Admission medications   Medication Sig Start Date End Date Taking? Authorizing Provider  medroxyPROGESTERone (DEPO-PROVERA) 150 MG/ML injection Inject 150 mg into the muscle every 3 (three) months. 13 Weeks 09/07/19  Yes [provider]  AMITIZA 24 MCG capsule Take 24 mcg by mouth 2 (two) times daily. 04/21/20   [provider]  atorvastatin (LIPITOR) 40 MG tablet Take 40 mg by mouth daily. 05/01/20   [provider]  clindamycin (CLEOCIN) 300 MG capsule Take 1 capsule (300 mg total) by mouth 3 (three) times daily. 07/23/20  Ocie Doyne, DDS  diclofenac Sodium (VOLTAREN) 1 % GEL Apply 4 g topically 2 (two) times daily. 04/29/20   [provider]  divalproex (DEPAKOTE) 500 MG DR tablet Take 1 tablet (500 mg total) by mouth every 12 (twelve) hours. 10/30/19   Aldean Baker, NP  HYDROcodone-acetaminophen (NORCO) 5-325 MG tablet Take 1 tablet by mouth every 6 (six) hours as needed for moderate pain. 07/23/20   Ocie Doyne, DDS  hydrOXYzine (ATARAX/VISTARIL) 25 MG tablet Take 1 tablet (25 mg total) by mouth 3 (three) times daily as needed for anxiety. Patient taking differently: Take 25-50 mg by mouth See admin instructions. Take 50 mg by mouth at bedtime for sleep and  25 mg three times a day as needed for Anxiety/ agitiation 10/30/19   Aldean Baker, NP  ibuprofen (ADVIL) 800 MG tablet Take 800 mg by mouth daily as needed for mild pain or moderate pain. With food     [provider]  Iloperidone 2 MG TABS Take 2 tablets (4 mg total) by mouth 3 (three) times daily. 10/30/19   Aldean Baker, NP  levothyroxine (SYNTHROID) 25 MCG tablet Take 1 tablet (25 mcg total) by mouth daily at 6 (six) AM. 10/31/19   Aldean Baker, NP  Multiple Vitamin (MULTIVITAMIN WITH MINERALS) TABS tablet Take 1 tablet by mouth daily.    [provider]  nicotine polacrilex (NICORETTE) 2 MG gum Take 1 each (2 mg total) by mouth as needed for smoking cessation. Patient not taking: Reported on 05/12/2020 10/30/19   Aldean Baker, NP  traZODone (DESYREL) 100 MG tablet Take 2 tablets (200 mg total) by mouth at bedtime as needed for sleep. 10/30/19   Aldean Baker, NP    Allergies    Benadryl [diphenhydramine] and Penicillins  Review of Systems   Review of Systems  All other systems reviewed and are negative.   Physical Exam Updated Vital Signs BP 132/77 (BP Location: Left Arm)   Pulse 79   Temp 98.3 F (36.8 C) (Oral)   Resp (!) 22   Ht 4\' 9"  (1.448 m)   Wt 93.4 kg   SpO2 99%   BMI 44.58 kg/m   Physical Exam Vitals and nursing note reviewed.  Constitutional:      General: She is not in acute distress.    Appearance: She is well-developed.  HENT:     Head: Normocephalic and atraumatic.  Eyes:     Conjunctiva/sclera: Conjunctivae normal.  Cardiovascular:     Rate and Rhythm: Normal rate and regular rhythm.     Heart sounds: No murmur heard.   Pulmonary:     Effort: Pulmonary effort is normal. No respiratory distress.     Breath sounds: Normal breath sounds.  Abdominal:     Palpations: Abdomen is soft.     Tenderness: There is no abdominal tenderness.  Musculoskeletal:        General: Normal range of motion.     Cervical back: Neck supple.    Skin:    General: Skin is warm and dry.  Neurological:     Mental Status: She is alert and oriented to person, place, and time.  Psychiatric:        Mood and Affect: Mood normal.        Behavior: Behavior normal.     ED Results / Procedures / Treatments   Labs (all labs ordered are listed, but only abnormal results are displayed) Labs Reviewed  COMPREHENSIVE METABOLIC PANEL -  Abnormal; Notable for the following components:      Result Value   CO2 20 (*)    Glucose, Bld 125 (*)    Albumin 3.4 (*)    All other components within normal limits  SALICYLATE LEVEL - Abnormal; Notable for the following components:   Salicylate Lvl <7.0 (*)    All other components within normal limits  ACETAMINOPHEN LEVEL - Abnormal; Notable for the following components:   Acetaminophen (Tylenol), Serum <10 (*)    All other components within normal limits  ETHANOL  CBC  RAPID URINE DRUG SCREEN, HOSP PERFORMED  I-STAT BETA HCG BLOOD, ED ([facility], [facility], [facility] ONLY)    EKG None  Radiology No results found.  Procedures Procedures (including critical care time)  Medications Ordered in ED Medications - No data to display  ED Course  I have reviewed the triage vital signs and the nursing notes.  Pertinent labs & imaging results that were available during my care of the patient were reviewed by me and considered in my medical decision making (see chart for details).    MDM Rules/Calculators/A&P                          Patient with suicidal thoughts.  Laboratory work-up is reassuring.  Vital signs are stable.  Patient is medically clear for TTS evaluation.  6:06 AM TTS consult pending.  Final Clinical Impression(s) / ED Diagnoses Final diagnoses:  Suicidal thoughts    Rx / DC Orders ED Discharge Orders    None       Roxy Horseman, PA-C 08/08/20 0606    Maia Plan, MD 08/08/20 [phone]

## 2020-08-08 NOTE — ED Notes (Signed)
Pt asked sitter to speak to RN. Pt states to RN "I don't think I should be sent back home, I will stab myself in the throat with a pen if I have to. I need to go somewhere that can change my medications." Pt informed that her concerns would be passed along to he appropriate provider. BH notified, will continue to monitor.

## 2020-08-08 NOTE — Progress Notes (Addendum)
CSW left a voice message with Tami [name] [phone]), pt's group home administrator, requesting a return phone call.    CSW spoke with pt's legal guardian, Tami [name] [phone]) who shared that pt "has done this before", meaning being brought to the ED for SI and would be okay with her being psychiatrically cleared today if symptoms have improved.    Tami [name], MSW, LCSW, LCAS Clinical Social Worker II Disposition CSW [phone]   UPDATE 140pm: CSW left another voice message for Tami [name] requesting a return phone call.    UPDATE 200pm: CSW received phone call from Ms Tami [name]. She reports that pt can return when/if psychiatrically cleared. She requests that someone text her at [phone] when pt is ready to be picked up.

## 2020-08-08 NOTE — Consult Note (Signed)
  Case was staffed during treatment team and was cleared to returned back to group home. CSW to follow-up with group home for discharge instructions.

## 2020-08-08 NOTE — ED Notes (Signed)
Assuming care of patient at this time. Pt resting in bed and in NAD. Pt in paper scrubs. Still awaiting 1:1 sitter for SI precautions. Bed in low position. Resp even and unlabored.

## 2020-08-09 DIAGNOSIS — R45851 Suicidal ideations: Secondary | ICD-10-CM | POA: Diagnosis not present

## 2020-08-09 NOTE — ED Notes (Signed)
Breakfast Ordered 

## 2020-08-09 NOTE — Consult Note (Addendum)
Telepsych Consultation   Reason for Consult:  Suicidal Ideations Referring Physician:  EDP Location of Patient: 041 Location of Provider: [facility]  Patient Identification: Tami [name] MRN:  [id number] Principal Diagnosis: <principal problem not specified> Diagnosis:  Active Problems:   * No active hospital problems. *   Total Time spent with patient: 15 minutes  Subjective:   Tami [name] is a 29 y.o. female was seen and evaluated via teleassessment.  She is awake, alert and oriented x3.  Tami [name] nurse sitter at bedside.  Chart reviewed Tami [name] was cleared by psychiatry on yesterday 08/08/2020 however was charted patient endorsed suicidal ideations with a plan to stab herself in the neck due to " returning back to her group home."  Tami [name] reports today " I am lonely at the group home and I need something for my ADHD like Adderall or Concerta and Seroquel ."  Discussed follow-up with primary psychiatrist.  She reports her follow-up appointment is 10/29 and does not want a wait that long for Adderall.  Discussed initating Seroquel 25 mg p.o. nightly as needed - x10 tablets will be provided at discharge.  Patient to follow-up with outpatient provider regarding Adderall.  Patient was receptive to plan.  Support,encouragement and reassurance was provided.  Patient is denying suicidal or homicidal ideations.  Denies auditory or visual hallucinations. Case was staffed with Attending Psychiatrist  Lucianne Muss.   HPI: Per readmission assessment note: 29 year old lady who presented to ER with suicidal ideations.  Initial TTS consult recommending inpatient placement.  Day team however recommended that patient be discharged back to her group home.  Prior to discharge back to group home, patient reported ongoing suicidal ideations.  When I assessed patient, she reports concern for SI with thoughts of stabbing her self in the throat with a pen.  Patient is calm, cooperative, not  responding to internal stimuli, concern this is more likely SI and concern actual intent, concern for possible malingering behavior, secondary gain.  Nevertheless, given her symptoms I believe it would be prudent to have psychiatry reassess patient.  I reviewed case with the nurse practitioner on-call for psychiatry and she recommended getting TTS to perform another assessment prior to dispo.  Past Psychiatric History:  Risk to Self:   Risk to Others:   Prior Inpatient Therapy:   Prior Outpatient Therapy:    Past Medical History:  Past Medical History:  Diagnosis Date  . ADHD (attention deficit hyperactivity disorder)   . Anxiety   . Bipolar disorder (HCC)   . Chronic back pain   . Cognitive developmental delay   . Constipation   . Depression   . Diabetes mellitus without complication (HCC)    Type II - diabetes- 07/22/20- hasn't started on medicaion  . HLD (hyperlipidemia)   . Hypothyroidism   . Seizures (HCC)     Past Surgical History:  Procedure Laterality Date  . THROAT SURGERY     throat surgery when younger doesn't know why  . TOOTH EXTRACTION N/A 07/23/2020   Procedure: DENTAL RESTORATION/EXTRACTIONS;  Surgeon: Ocie Doyne, DDS;  Location: [facility] OR;  Service: Oral Surgery;  Laterality: N/A;   Family History:  Family History  Problem Relation Age of Onset  . Dementia Neg Hx    Family Psychiatric  History:  Social History:  Social History   Substance and Sexual Activity  Alcohol Use No  . Alcohol/week: 0.0 standard drinks     Social History   Substance and Sexual Activity  Drug Use  No    Social History   Socioeconomic History  . Marital status: Single    Spouse name: Not on file  . Number of children: Not on file  . Years of education: GED  . Highest education level: Not on file  Occupational History  . Not on file  Tobacco Use  . Smoking status: Former Smoker    Packs/day: 0.25    Years: 4.00    Pack years: 1.00    Types: Cigarettes    Quit date:  2019    Years since quitting: 2.7  . Smokeless tobacco: Never Used  . Tobacco comment: " may smoke when she goes home."  Substance and Sexual Activity  . Alcohol use: No    Alcohol/week: 0.0 standard drinks  . Drug use: No  . Sexual activity: Not Currently    Birth control/protection: Other-see comments    Comment: pt says she gets Depo shot  Other Topics Concern  . Not on file  Social History Narrative   Lives at other Residents and staff of group home   Caffeine use: Drinks soda rarely    Social Determinants of Health   Financial Resource Strain:   . Difficulty of Paying Living Expenses: Not on file  Food Insecurity:   . Worried About Programme researcher, broadcasting/film/video in the Last Year: Not on file  . Ran Out of Food in the Last Year: Not on file  Transportation Needs:   . Lack of Transportation (Medical): Not on file  . Lack of Transportation (Non-Medical): Not on file  Physical Activity:   . Days of Exercise per Week: Not on file  . Minutes of Exercise per Session: Not on file  Stress:   . Feeling of Stress : Not on file  Social Connections:   . Frequency of Communication with Friends and Family: Not on file  . Frequency of Social Gatherings with Friends and Family: Not on file  . Attends Religious Services: Not on file  . Active Member of Clubs or Organizations: Not on file  . Attends Banker Meetings: Not on file  . Marital Status: Not on file   Additional Social History:    Allergies:   Allergies  Allergen Reactions  . Benadryl [Diphenhydramine]     Itch   . Penicillins     Hives     Labs:  Results for orders placed or performed during the hospital encounter of 08/07/20 (from the past 48 hour(s))  Rapid urine drug screen (hospital performed)     Status: None   Collection Time: 08/07/20  6:59 PM  Result Value Ref Range   Opiates NONE DETECTED NONE DETECTED   Cocaine NONE DETECTED NONE DETECTED   Benzodiazepines NONE DETECTED NONE DETECTED   Amphetamines  NONE DETECTED NONE DETECTED   Tetrahydrocannabinol NONE DETECTED NONE DETECTED   Barbiturates NONE DETECTED NONE DETECTED    Comment: (NOTE) DRUG SCREEN FOR MEDICAL PURPOSES ONLY.  IF CONFIRMATION IS NEEDED FOR ANY PURPOSE, NOTIFY LAB WITHIN 5 DAYS.  LOWEST DETECTABLE LIMITS FOR URINE DRUG SCREEN Drug Class                     Cutoff (ng/mL) Amphetamine and metabolites    1000 Barbiturate and metabolites    200 Benzodiazepine                 200 Tricyclics and metabolites     300 Opiates and metabolites        300  Cocaine and metabolites        300 THC                            50 Performed at [facility], [address] [place], [place] [address]   Comprehensive metabolic panel     Status: Abnormal   Collection Time: 08/07/20  7:24 PM  Result Value Ref Range   Sodium 140 135 - 145 mmol/L   Potassium 4.0 3.5 - 5.1 mmol/L   Chloride 108 98 - 111 mmol/L   CO2 20 (L) 22 - 32 mmol/L   Glucose, Bld 125 (H) 70 - 99 mg/dL    Comment: Glucose reference range applies only to samples taken after fasting for at least 8 hours.   BUN 8 6 - 20 mg/dL   Creatinine, Ser 6.04 0.44 - 1.00 mg/dL   Calcium 9.3 8.9 - 54.0 mg/dL   Total Protein 6.6 6.5 - 8.1 g/dL   Albumin 3.4 (L) 3.5 - 5.0 g/dL   AST 17 15 - 41 U/L   ALT 30 0 - 44 U/L   Alkaline Phosphatase 104 38 - 126 U/L   Total Bilirubin 0.3 0.3 - 1.2 mg/dL   GFR calc non Af Amer >60 >60 mL/min   Anion gap 12 5 - 15    Comment: Performed at [facility], [address], [place], [place] [address]  Ethanol     Status: None   Collection Time: 08/07/20  7:24 PM  Result Value Ref Range   Alcohol, Ethyl (B) <10 <10 mg/dL    Comment: (NOTE) Lowest detectable limit for serum alcohol is 10 mg/dL.  For medical purposes only. Performed at [facility], [address] [place], [place] [address]   Salicylate level     Status: Abnormal   Collection Time: 08/07/20  7:24 PM  Result Value Ref Range   Salicylate Lvl <7.0  (L) 7.0 - 30.0 mg/dL    Comment: Performed at [facility], [address], [place], [place] [address]  Acetaminophen level     Status: Abnormal   Collection Time: 08/07/20  7:24 PM  Result Value Ref Range   Acetaminophen (Tylenol), Serum <10 (L) 10 - 30 ug/mL    Comment: (NOTE) Therapeutic concentrations vary significantly. A range of 10-30 ug/mL  may be an effective concentration for many patients. However, some  are best treated at concentrations outside of this range. Acetaminophen concentrations >150 ug/mL at 4 hours after ingestion  and >50 ug/mL at 12 hours after ingestion are often associated with  toxic reactions.  Performed at [facility], [address], [place], [place] [address]   cbc     Status: None   Collection Time: 08/07/20  7:24 PM  Result Value Ref Range   WBC 7.8 4.0 - 10.5 K/uL   RBC 4.81 3.87 - 5.11 MIL/uL   Hemoglobin 12.6 12.0 - 15.0 g/dL   HCT 78.4 36 - 46 %   MCV 83.6 80.0 - 100.0 fL   MCH 26.2 26.0 - 34.0 pg   MCHC 31.3 30.0 - 36.0 g/dL   RDW 69.6 29.5 - 28.4 %   Platelets 260 150 - 400 K/uL   nRBC 0.0 0.0 - 0.2 %    Comment: Performed at [facility], [address] [place], [place] [address]  I-Stat beta hCG blood, ED     Status:  None   Collection Time: 08/07/20  7:52 PM  Result Value Ref Range   I-stat hCG, quantitative <5.0 <5 mIU/mL   Comment 3            Comment:   GEST. AGE      CONC.  (mIU/mL)   <=1 WEEK        5 - 50     2 WEEKS       50 - 500     3 WEEKS       100 - 10,000     4 WEEKS     1,000 - 30,000        FEMALE AND NON-PREGNANT FEMALE:     LESS THAN 5 mIU/mL   Respiratory Panel by RT PCR (Flu A&B, Covid) - Nasopharyngeal Swab     Status: None   Collection Time: 08/08/20  3:11 AM   Specimen: Nasopharyngeal Swab  Result Value Ref Range   SARS Coronavirus 2 by RT PCR NEGATIVE NEGATIVE    Comment: (NOTE) SARS-CoV-2 target nucleic acids are NOT DETECTED.  The SARS-CoV-2 RNA is generally detectable in upper  respiratoy specimens during the acute phase of infection. The lowest concentration of SARS-CoV-2 viral copies this assay can detect is 131 copies/mL. A negative result does not preclude SARS-Cov-2 infection and should not be used as the sole basis for treatment or other patient management decisions. A negative result may occur with  improper specimen collection/handling, submission of specimen other than nasopharyngeal swab, presence of viral mutation(s) within the areas targeted by this assay, and inadequate number of viral copies (<131 copies/mL). A negative result must be combined with clinical observations, patient history, and epidemiological information. The expected result is Negative.  Fact Sheet for Patients:  https://www.moore.com/  Fact Sheet for Healthcare Providers:  https://www.young.biz/  This test is no t yet approved or cleared by the [place] FDA and  has been authorized for detection and/or diagnosis of SARS-CoV-2 by FDA under an Emergency Use Authorization (EUA). This EUA will remain  in effect (meaning this test can be used) for the duration of the COVID-19 declaration under Section 564(b)(1) of the Act, 21 U.S.C. section 360bbb-3(b)(1), unless the authorization is terminated or revoked sooner.     Influenza A by PCR NEGATIVE NEGATIVE   Influenza B by PCR NEGATIVE NEGATIVE    Comment: (NOTE) The Xpert Xpress SARS-CoV-2/FLU/RSV assay is intended as an aid in  the diagnosis of influenza from Nasopharyngeal swab specimens and  should not be used as a sole basis for treatment. Nasal washings and  aspirates are unacceptable for Xpert Xpress SARS-CoV-2/FLU/RSV  testing.  Fact Sheet for Patients: https://www.moore.com/  Fact Sheet for Healthcare Providers: https://www.young.biz/  This test is not yet approved or cleared by the [place] FDA and  has been authorized for  detection and/or diagnosis of SARS-CoV-2 by  FDA under an Emergency Use Authorization (EUA). This EUA will remain  in effect (meaning this test can be used) for the duration of the  Covid-19 declaration under Section 564(b)(1) of the Act, 21  U.S.C. section 360bbb-3(b)(1), unless the authorization is  terminated or revoked. Performed at [facility], [address] [place], [place] [address]     Medications:  No current facility-administered medications for this encounter.   Current Outpatient Medications  Medication Sig Dispense Refill  . levothyroxine (SYNTHROID) 25 MCG tablet Take 1 tablet (25 mcg total) by mouth daily at 6 (six) AM. 30 tablet 0  .  medroxyPROGESTERone (DEPO-PROVERA) 150 MG/ML injection Inject 150 mg into the muscle every 3 (three) months. 13 Weeks    . Multiple Vitamin (MULTIVITAMIN WITH MINERALS) TABS tablet Take 1 tablet by mouth daily.    . AMITIZA 24 MCG capsule Take 24 mcg by mouth 2 (two) times daily.    Marland Kitchen atorvastatin (LIPITOR) 40 MG tablet Take 40 mg by mouth daily.    . clindamycin (CLEOCIN) 300 MG capsule Take 1 capsule (300 mg total) by mouth 3 (three) times daily. 21 capsule 0  . diclofenac Sodium (VOLTAREN) 1 % GEL Apply 4 g topically 2 (two) times daily.    . divalproex (DEPAKOTE) 500 MG DR tablet Take 1 tablet (500 mg total) by mouth every 12 (twelve) hours. 60 tablet 0  . HYDROcodone-acetaminophen (NORCO) 5-325 MG tablet Take 1 tablet by mouth every 6 (six) hours as needed for moderate pain. 20 tablet 0  . hydrOXYzine (ATARAX/VISTARIL) 25 MG tablet Take 1 tablet (25 mg total) by mouth 3 (three) times daily as needed for anxiety. (Patient taking differently: Take 25-50 mg by mouth See admin instructions. Take 50 mg by mouth at bedtime for sleep and 25 mg three times a day as needed for Anxiety/ agitiation) 30 tablet 0  . ibuprofen (ADVIL) 800 MG tablet Take 800 mg by mouth daily as needed for mild pain or moderate pain. With food     . Iloperidone  2 MG TABS Take 2 tablets (4 mg total) by mouth 3 (three) times daily. 90 tablet 0  . nicotine polacrilex (NICORETTE) 2 MG gum Take 1 each (2 mg total) by mouth as needed for smoking cessation. (Patient not taking: Reported on 08/08/2020) 100 tablet 0  . traZODone (DESYREL) 100 MG tablet Take 2 tablets (200 mg total) by mouth at bedtime as needed for sleep. 30 tablet 0    Musculoskeletal: Strength & Muscle Tone: within normal limits Gait & Station: normal Patient leans: N/A  Psychiatric Specialty Exam: Physical Exam Vitals reviewed.  Psychiatric:        Mood and Affect: Mood normal.        Behavior: Behavior normal.     Review of Systems  Psychiatric/Behavioral: Positive for behavioral problems. The patient is nervous/anxious.   All other systems reviewed and are negative.   Blood pressure 109/60, pulse 90, temperature (!) 97.5 F (36.4 C), temperature source Oral, resp. rate 20, height 4\' 9"  (1.448 m), weight 93.4 kg, SpO2 100 %.Body mass index is 44.58 kg/m.  General Appearance: Casual  Eye Contact:  Fair  Speech:  Clear and Coherent  Volume:  Normal  Mood:  Anxious and Depressed  Affect:  Congruent  Thought Process:  Coherent  Orientation:  Full (Time, Place, and Person)  Thought Content:  Hallucinations: None  Suicidal Thoughts:  No  Homicidal Thoughts:  No  Memory:  Immediate;   Fair Recent;   Fair  Judgement:  Fair  Insight:  Fair  Psychomotor Activity:  Normal  Concentration:  Concentration: Fair  Recall:  of Knowledge:  Fair  Language:  Fair  Akathisia:  No  Handed:  Right  AIMS (if indicated):     Assets:  Communication Skills Resilience Social Support  ADL's:  Intact  Cognition:  WNL  Sleep:     NP spoke to EDP  regarding discharge disposition  -Prdx for Seroquel 25 mg po qhs PRN x 10 tablets for mood stablization - Patient is cleared by Psychiatry  Disposition: No evidence of imminent risk  to self or others at present.   Patient does not  meet criteria for psychiatric inpatient admission. Supportive therapy provided about ongoing stressors. Refer to IOP. Discussed crisis plan, support from social network, calling 911, coming to the Emergency Department, and calling Suicide Hotline.  This service was provided via telemedicine using a 2-way, interactive audio and video technology.  Names of all persons participating in this telemedicine service and their role in this encounter. Name: Leilah Pavey Role: patient   Name: Tami [name] Role: NP  Name: sitter at bedside Role: NT       Oneta Rackanika N Evvie Behrmann, NP 08/09/2020 10:04 AM

## 2020-08-09 NOTE — Discharge Instructions (Addendum)
Return to the ER if you start to experience thoughts of wanting to harm yourself or others, injuries, chest pain.

## 2020-08-09 NOTE — TOC Initial Note (Signed)
Transition of Care [facility]) - Initial/Assessment Note    Patient Details  Name: Tami [name] MRN: [id number] Date of Birth: 1991/05/07  Transition of Care [facility]) CM/SW Contact:    Lockie Pares, RN Phone Number: 08/09/2020, 8:58 AM  Clinical Narrative:                 Docia Furl to re-evaluate patient per note of last night by ED MD. Last note from [facility] indicated patient was cleared, however, patient stated if going back to group home "will stab myself with a pen in the throat"  This has happened previously,believe it to be  attempts at malingering, however the request from MD was for another [facility] review prior to discharge. There have been no further notes,indicating otherwise, so will proceed with re-evaluation prior to discharge. Group home Ms Ericka Pontiff asks to be texted when cleared and ready to be picked up [phone]    Barriers to Discharge: ED Awaiting Psych Clearance   Patient Goals and CMS Choice        Expected Discharge Plan and Services                                                Prior Living Arrangements/Services                       Activities of Daily Living      Permission Sought/Granted                  Emotional Assessment              Admission diagnosis:  suicidal  Patient Active Problem List   Diagnosis Date Noted  . 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 Homicidal thoughts 10/25/2019  . MDD (major depressive disorder) 10/25/2019  . Bipolar affective disorder, current episode mixed (HCC) 07/28/2017  . Subacute confusional state 07/21/2015  . Memory loss 07/21/2015  . Agitation 07/21/2015  . Altered mental status 07/21/2015  . Bipolar disorder (HCC) 09/04/2013  . Thought disorder 09/02/2013  . Marijuana dependence (HCC) 09/02/2013  . Cocaine abuse (HCC) 09/02/2013  . Tobacco dependence 09/02/2013   PCP:  Fleet Contras, MD Pharmacy:    [facility] - [place], [place] - [address] [place] [address] Phone: [phone] Fax: [phone]     Social Determinants of Health (SDOH) Interventions    Readmission Risk Interventions No flowsheet data found.

## 2020-08-09 NOTE — ED Provider Notes (Signed)
Update; 29 year old female presenting to the ED with suicidal ideation.  Initial consult recommending inpatient treatment.  She was evaluated by the day team who recommend patient be discharged back to group home.  She was again brought to the ER after discharge.  She was evaluated by TTS this morning, denying any SI, HI, AVH.  They recommend discharge.  Will give outpatient resources.  Patient remains hemodynamically stable and agreeable to plan.   Dietrich Pates, PA-C 08/09/20 1244    [name], MD 08/09/20 1251

## 2020-08-09 NOTE — ED Notes (Signed)
TTS eval in progress at this time.

## 2020-08-09 NOTE — TOC Progression Note (Signed)
Transition of Care [facility]) - Progression Note    Patient Details  Name: Tami [name] MRN: [id number] Date of Birth: [birth year]  [facility] CM/SW Contact  Lockie Pares, RN Phone Number: 08/09/2020, 10:58 AM  Clinical Narrative:    Called both number I had for Behavioral Health [phone] and [phone] and left messages on both to have patient re-evaluated      Barriers to Discharge: ED Awaiting Psych Clearance  Expected Discharge Plan and Services                                                 Social Determinants of Health (SDOH) Interventions    Readmission Risk Interventions No flowsheet data found.

## 2020-08-22 ENCOUNTER — Other Ambulatory Visit: Payer: Self-pay

## 2020-08-22 ENCOUNTER — Emergency Department (HOSPITAL_COMMUNITY)
Admission: EM | Admit: 2020-08-22 | Discharge: 2020-08-22 | Disposition: A | Discharge status: Home / Self Care | Payer: Medicaid Other | Attending: Emergency Medicine | Admitting: Emergency Medicine

## 2020-08-22 ENCOUNTER — Encounter (HOSPITAL_COMMUNITY): Payer: Self-pay

## 2020-08-22 DIAGNOSIS — R109 Unspecified abdominal pain: Secondary | ICD-10-CM | POA: Diagnosis not present

## 2020-08-22 DIAGNOSIS — E039 Hypothyroidism, unspecified: Secondary | ICD-10-CM | POA: Insufficient documentation

## 2020-08-22 DIAGNOSIS — E1165 Type 2 diabetes mellitus with hyperglycemia: Secondary | ICD-10-CM | POA: Diagnosis present

## 2020-08-22 DIAGNOSIS — Z87891 Personal history of nicotine dependence: Secondary | ICD-10-CM | POA: Insufficient documentation

## 2020-08-22 DIAGNOSIS — R739 Hyperglycemia, unspecified: Secondary | ICD-10-CM

## 2020-08-22 DIAGNOSIS — Z79899 Other long term (current) drug therapy: Secondary | ICD-10-CM | POA: Insufficient documentation

## 2020-08-22 LAB — CBC
HCT: 36.2 % (ref 36.0–46.0)
Hemoglobin: 11.6 g/dL — ABNORMAL LOW (ref 12.0–15.0)
MCH: 27.4 pg (ref 26.0–34.0)
MCHC: 32 g/dL (ref 30.0–36.0)
MCV: 85.6 fL (ref 80.0–100.0)
Platelets: 222 10*3/uL (ref 150–400)
RBC: 4.23 MIL/uL (ref 3.87–5.11)
RDW: 14.2 % (ref 11.5–15.5)
WBC: 5.9 10*3/uL (ref 4.0–10.5)
nRBC: 0 % (ref 0.0–0.2)

## 2020-08-22 LAB — URINALYSIS, ROUTINE W REFLEX MICROSCOPIC
Bilirubin Urine: NEGATIVE
Glucose, UA: >=500 mg/dL — AB
Hgb urine dipstick: NEGATIVE
Ketones, ur: 5 mg/dL — AB
Leukocytes,Ua: NEGATIVE
Nitrite: NEGATIVE
Protein, ur: NEGATIVE mg/dL
Specific Gravity, Urine: 1.008 (ref 1.005–1.030)
pH: 6 (ref 5.0–8.0)

## 2020-08-22 LAB — BASIC METABOLIC PANEL
Anion gap: 8 (ref 5–15)
BUN: 9 mg/dL (ref 6–20)
CO2: 20 mmol/L — ABNORMAL LOW (ref 22–32)
Calcium: 7.3 mg/dL — ABNORMAL LOW (ref 8.9–10.3)
Chloride: 113 mmol/L — ABNORMAL HIGH (ref 98–111)
Creatinine, Ser: 0.44 mg/dL (ref 0.44–1.00)
GFR, Estimated: >60 mL/min (ref >60)
Glucose, Bld: 195 mg/dL — ABNORMAL HIGH (ref 70–99)
Potassium: 3.6 mmol/L (ref 3.5–5.1)
Sodium: 141 mmol/L (ref 135–145)

## 2020-08-22 LAB — I-STAT BETA HCG BLOOD, ED (MC, WL, AP ONLY): I-stat hCG, quantitative: <5 m[IU]/mL (ref <5)

## 2020-08-22 LAB — CBG MONITORING, ED
Glucose-Capillary: 220 mg/dL — ABNORMAL HIGH (ref 70–99)
Glucose-Capillary: 116 mg/dL — ABNORMAL HIGH (ref 70–99)

## 2020-08-22 MED ORDER — INSULIN ASPART 100 UNIT/ML ~~LOC~~ SOLN
5.0000 [IU] | Freq: Once | SUBCUTANEOUS | Status: DC
  Filled 2020-08-22: qty 0.05

## 2020-08-22 MED ORDER — ONDANSETRON HCL 4 MG/2ML IJ SOLN
4.0000 mg | Freq: Once | INTRAMUSCULAR | Status: AC
  Administered 2020-08-22: 4 mg via INTRAVENOUS
  Filled 2020-08-22: qty 2

## 2020-08-22 MED ORDER — SODIUM CHLORIDE 0.9 % IV BOLUS
1000.0000 mL | Freq: Once | INTRAVENOUS | Status: AC
  Administered 2020-08-22: 1000 mL via INTRAVENOUS

## 2020-08-22 MED ORDER — INSULIN ASPART 100 UNIT/ML ~~LOC~~ SOLN
3.0000 [IU] | Freq: Once | SUBCUTANEOUS | Status: AC
  Administered 2020-08-22: 3 [IU] via INTRAVENOUS
  Filled 2020-08-22: qty 0.03

## 2020-08-22 NOTE — Discharge Instructions (Signed)
Make sure you take your medications as directed.  Follow-up with your primary care doctor.  Return the emergency department for any vomiting, abdominal pain, difficulty breathing or any other worsening or concerning symptoms.

## 2020-08-22 NOTE — ED Provider Notes (Signed)
[facility]-EMERGENCY DEPT Provider Note   CSN: [id number] Arrival date & time: 08/22/20  1429     History Chief Complaint  Patient presents with  . Dizziness  . Hyperglycemia    Tami [name] is a 29 y.o. female past history of ADHD, anxiety, bipolar, depression, diabetes who presents for evaluation of concerns for hyperglycemia.  Patient reports she was diagnosed with diabetes about a month ago.  She had not been started on any medication until recently.  She states she was just given her Trulicity today.  She reports that today after eating, she felt tired and generalized weakness and so she checked her blood sugar was 274.  She has been checking it over the past week and has been ranging between 150-170.  The lowest she has had has been 98.  She states she has had some intermittent abdominal pain but no nausea/vomiting.  No urinary complaints, fevers.  She states she feels better now after receiving fluids.  She states that she does not feel weak just feels tired.  Denies any chest pain, difficulty breathing.  The history is provided by the patient.       Past Medical History:  Diagnosis Date  . ADHD (attention deficit hyperactivity disorder)   . Anxiety   . Bipolar disorder (HCC)   . Chronic back pain   . Cognitive developmental delay   . Constipation   . Depression   . Diabetes mellitus without complication (HCC)    Type II - diabetes- 07/22/20- hasn't started on medicaion  . HLD (hyperlipidemia)   . Hypothyroidism   . Seizures [facility])     Patient Active Problem List   Diagnosis Date Noted  . 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 Homicidal thoughts 10/25/2019  . MDD (major depressive disorder) 10/25/2019  . Bipolar affective disorder, current episode mixed (HCC) 07/28/2017  . Subacute confusional state 07/21/2015  . Memory loss 07/21/2015  . Agitation 07/21/2015  . Altered  mental status 07/21/2015  . Bipolar disorder (HCC) 09/04/2013  . Thought disorder 09/02/2013  . Marijuana dependence (HCC) 09/02/2013  . Cocaine abuse (HCC) 09/02/2013  . Tobacco dependence 09/02/2013    Past Surgical History:  Procedure Laterality Date  . THROAT SURGERY     throat surgery when younger doesn't know why  . TOOTH EXTRACTION N/A 07/23/2020   Procedure: DENTAL RESTORATION/EXTRACTIONS;  Surgeon: Ocie Doyne, DDS;  Location: [facility] OR;  Service: Oral Surgery;  Laterality: N/A;     OB History   No obstetric history on file.     Family History  Problem Relation Age of Onset  . Dementia Neg Hx     Social History   Tobacco Use  . Smoking status: Former Smoker    Packs/day: 0.25    Years: 4.00    Pack years: 1.00    Types: Cigarettes    Quit date: 2019    Years since quitting: 2.8  . Smokeless tobacco: Never Used  . Tobacco comment: " may smoke when she goes home."  Vaping Use  . Vaping Use: Never used  Substance Use Topics  . Alcohol use: No    Alcohol/week: 0.0 standard drinks  . Drug use: No    Home Medications Prior to Admission medications   Medication Sig Start Date End Date Taking? Authorizing Provider  AMITIZA 24 MCG capsule Take 24 mcg by mouth 2 (two) times daily. 04/21/20  Yes [provider]  atorvastatin (LIPITOR) 40  MG tablet Take 40 mg by mouth daily. 05/01/20  Yes [provider]  diclofenac Sodium (VOLTAREN) 1 % GEL Apply 4 g topically 2 (two) times daily. 04/29/20  Yes [provider]  divalproex (DEPAKOTE) 500 MG DR tablet Take 1 tablet (500 mg total) by mouth every 12 (twelve) hours. 10/30/19  Yes Aldean BakerSykes, Janet E, NP  HYDROcodone-acetaminophen (NORCO) 5-325 MG tablet Take 1 tablet by mouth every 6 (six) hours as needed for moderate pain. 07/23/20  Yes Ocie DoyneJensen, Scott, DDS  hydrOXYzine (ATARAX/VISTARIL) 25 MG tablet Take 1 tablet (25 mg total) by mouth 3 (three) times daily as needed for anxiety. Patient taking  differently: Take 25 mg by mouth in the morning and at bedtime.  10/30/19  Yes Aldean BakerSykes, Janet E, NP  ibuprofen (ADVIL) 800 MG tablet Take 800 mg by mouth 3 (three) times daily as needed for mild pain or moderate pain.    Yes [provider]  Iloperidone (FANAPT) 6 MG TABS Take 6 mg by mouth in the morning and at bedtime.    Yes [provider]  levothyroxine (SYNTHROID) 25 MCG tablet Take 1 tablet (25 mcg total) by mouth daily at 6 (six) AM. 10/31/19  Yes Aldean BakerSykes, Janet E, NP  medroxyPROGESTERone (DEPO-PROVERA) 150 MG/ML injection Inject 150 mg into the muscle every 3 (three) months.  09/07/19  Yes [provider]  meloxicam (MOBIC) 15 MG tablet Take 15 mg by mouth daily.   Yes [provider]  Multiple Vitamin (MULTIVITAMIN WITH MINERALS) TABS tablet Take 1 tablet by mouth daily.   Yes [provider]  naproxen (NAPROSYN) 500 MG tablet Take 500 mg by mouth 2 (two) times daily as needed for moderate pain.   Yes [provider]  traZODone (DESYREL) 100 MG tablet Take 2 tablets (200 mg total) by mouth at bedtime as needed for sleep. 10/30/19  Yes Aldean BakerSykes, Janet E, NP  clindamycin (CLEOCIN) 300 MG capsule Take 1 capsule (300 mg total) by mouth 3 (three) times daily. Patient not taking: Reported on 08/22/2020 07/23/20   Ocie DoyneJensen, Scott, DDS  Dulaglutide (TRULICITY) 0.75 MG/0.5ML SOPN Inject 0.75 mg into the skin once a week.    [provider]  Iloperidone 2 MG TABS Take 2 tablets (4 mg total) by mouth 3 (three) times daily. Patient not taking: Reported on 08/22/2020 10/30/19   Aldean BakerSykes, Janet E, NP  nicotine polacrilex (NICORETTE) 2 MG gum Take 1 each (2 mg total) by mouth as needed for smoking cessation. Patient not taking: Reported on 08/08/2020 10/30/19   Aldean BakerSykes, Janet E, NP    Allergies    Benadryl [diphenhydramine] and Penicillins  Review of Systems   Review of Systems  Constitutional: Positive for fatigue. Negative for fever.  Respiratory:  Negative for shortness of breath.   Cardiovascular: Negative for chest pain.  Gastrointestinal: Positive for abdominal pain. Negative for nausea and vomiting.  Genitourinary: Positive for frequency. Negative for dysuria and hematuria.  All other systems reviewed and are negative.   Physical Exam Updated Vital Signs BP 124/82   Pulse 64   Temp 98.3 F (36.8 C) (Oral)   Resp 16   Ht 4\' 9"  (1.448 m)   Wt 93.4 kg   SpO2 99%   BMI 44.58 kg/m   Physical Exam Vitals and nursing note reviewed.  Constitutional:      Appearance: Normal appearance. She is well-developed.  HENT:     Head: Normocephalic and atraumatic.  Eyes:     General: Lids are normal.  Conjunctiva/sclera: Conjunctivae normal.     Pupils: Pupils are equal, round, and reactive to light.  Cardiovascular:     Rate and Rhythm: Normal rate and regular rhythm.     Pulses: Normal pulses.     Heart sounds: Normal heart sounds. No murmur heard.  No friction rub. No gallop.   Pulmonary:     Effort: Pulmonary effort is normal.     Breath sounds: Normal breath sounds.     Comments: Lungs clear to auscultation bilaterally.  Symmetric chest rise.  No wheezing, rales, rhonchi. Abdominal:     Palpations: Abdomen is soft. Abdomen is not rigid.     Tenderness: There is no abdominal tenderness. There is no guarding.     Comments: Abdomen is soft, non-distended, non-tender. No rigidity, No guarding. No peritoneal signs.  Musculoskeletal:        General: Normal range of motion.     Cervical back: Full passive range of motion without pain.  Skin:    General: Skin is warm and dry.     Capillary Refill: Capillary refill takes less than 2 seconds.  Neurological:     Mental Status: She is alert and oriented to person, place, and time.  Psychiatric:        Speech: Speech normal.     ED Results / Procedures / Treatments   Labs (all labs ordered are listed, but only abnormal results are displayed) Labs Reviewed  BASIC  METABOLIC PANEL - Abnormal; Notable for the following components:      Result Value   Chloride 113 (*)    CO2 20 (*)    Glucose, Bld 195 (*)    Calcium 7.3 (*)    All other components within normal limits  CBC - Abnormal; Notable for the following components:   Hemoglobin 11.6 (*)    All other components within normal limits  URINALYSIS, ROUTINE W REFLEX MICROSCOPIC - Abnormal; Notable for the following components:   Color, Urine STRAW (*)    Glucose, UA >=500 (*)    Ketones, ur 5 (*)    Bacteria, UA RARE (*)    All other components within normal limits  CBG MONITORING, ED - Abnormal; Notable for the following components:   Glucose-Capillary 220 (*)    All other components within normal limits  CBG MONITORING, ED - Abnormal; Notable for the following components:   Glucose-Capillary 116 (*)    All other components within normal limits  I-STAT BETA HCG BLOOD, ED ([facility], [facility], [facility] ONLY)  CBG MONITORING, ED  CBG MONITORING, ED    EKG None  Radiology No results found.  Procedures Procedures (including critical care time)  Medications Ordered in ED Medications  sodium chloride 0.9 % bolus 1,000 mL (0 mLs Intravenous Stopped 08/22/20 1829)  ondansetron (ZOFRAN) injection 4 mg (4 mg Intravenous Given 08/22/20 1723)  insulin aspart (novoLOG) injection 3 Units (3 Units Intravenous Given 08/22/20 1723)    ED Course  I have reviewed the triage vital signs and the nursing notes.  Pertinent labs & imaging results that were available during my care of the patient were reviewed by me and considered in my medical decision making (see chart for details).    MDM Rules/Calculators/A&P                          29 year old female with possible history of bipolar, depression who presents for evaluation of hyperglycemia.  Checked her blood sugar this morning and was  elevated to 74.  She has been started on Trulicity but just got the medication today.  No recent sickness, illness.  On initially  arrival, she is afebrile nontoxic-appearing.  Vital signs are stable.  On exam, lungs clear to auscultation.  She does report she has been having some abdominal pain over the last several weeks but has a benign abdominal exam.  I suspect this is most likely from not being on the medication.  Low suspicion for DKA but given her history, will plan to check. Her initial CBG here is 220.  I-stat beta negative. BMP shows bicarb of 20, BUN/creatinine within normal limits.  Glucose is 195.  Anion gap is 8.  CBC shows no leukocytosis.  Hemoglobin stable 11.6.  UA shows 5 ketones.  At this time, work-up is not consistent with DKA.  We will plan to give fluids, some amount of insulin and reassess her blood sugar  Repeat blood sugar is 116.  At this time, patient's work-up is not consistent with DKA.  She has not vomited.  Lab work is reassuring.  Patient be discharged home.  Patient instructed to take her Trulicity as directed.  Social work has been in contact with group home.  They will send somebody to get her.  Updated patient on plan.  She is agreeable.  Patient instructed to take her Trulicity as directed.  Instructed patient to follow-up with her primary care doctor. At this time, patient exhibits no emergent life-threatening condition that require further evaluation in ED. Patient had ample opportunity for questions and discussion. All patient's questions were answered with full understanding. Strict return precautions discussed. Patient expresses understanding and agreement to plan.   Portions of this note were generated with Scientist, clinical (histocompatibility and immunogenetics). Dictation errors may occur despite best attempts at proofreading.  Final Clinical Impression(s) / ED Diagnoses Final diagnoses:  Hyperglycemia    Rx / DC Orders ED Discharge Orders    None       Rosana Hoes 08/22/20 Caesar Chestnut, MD 08/22/20 2217

## 2020-08-22 NOTE — ED Triage Notes (Signed)
Patient is from a Group Home. patient has been c/o dizziness. Staff reports that the patient is a new diabetic and has not started on her Trulicity. Staff reports that the patient ate a lot of carbs today CBG- 274 . Patient received NS 150 ml prior to arrival to the ED   Caretaker Wilford Corner [phone]  Group Home address: [address] [place], [place] [address]

## 2020-08-22 NOTE — ED Notes (Signed)
An After Visit Summary was printed and given to the patient. Discharge instructions given and no further questions at this time.  Pt leaving via [facility] [employer] with SW help.

## 2020-08-22 NOTE — Progress Notes (Signed)
.  CSW received a call from pt's RN stating pt's group home manager is refusing to pick the pt up.  Per thr RN, the group home manager Louie Boston states the address is:  [address]  [place] [address]  CSW spoke with Wille Celeste at ph: [phone] who states pt is safe to transport in a [employer] vehicle but for safety prefers a female driver.  CSW spoke to [facility] [employer]'s driver who is a female and who can arrive in 3 minutes to pick up pt.  RN updated by [employer] and by a call to the ED Secretary.  6:16 PM Driver has arrived and all have been updated.  CSW will continue to follow for D/C needs.  Dorothe Pea. Jerriann Schrom  MSW, LCSW, LCAS, CCS Transitions of Care Clinical Social Worker Care Coordination Department Ph: [phone]

## 2020-10-21 ENCOUNTER — Emergency Department (HOSPITAL_COMMUNITY)
Admission: EM | Admit: 2020-10-21 | Discharge: 2020-10-21 | Disposition: A | Discharge status: Home / Self Care | Payer: Medicaid Other | Attending: Emergency Medicine | Admitting: Emergency Medicine

## 2020-10-21 ENCOUNTER — Other Ambulatory Visit: Payer: Self-pay

## 2020-10-21 ENCOUNTER — Encounter (HOSPITAL_COMMUNITY): Payer: Self-pay

## 2020-10-21 DIAGNOSIS — E039 Hypothyroidism, unspecified: Secondary | ICD-10-CM | POA: Insufficient documentation

## 2020-10-21 DIAGNOSIS — Z87891 Personal history of nicotine dependence: Secondary | ICD-10-CM | POA: Diagnosis not present

## 2020-10-21 DIAGNOSIS — E1165 Type 2 diabetes mellitus with hyperglycemia: Secondary | ICD-10-CM | POA: Insufficient documentation

## 2020-10-21 DIAGNOSIS — R739 Hyperglycemia, unspecified: Secondary | ICD-10-CM | POA: Diagnosis present

## 2020-10-21 DIAGNOSIS — Z79899 Other long term (current) drug therapy: Secondary | ICD-10-CM | POA: Insufficient documentation

## 2020-10-21 LAB — CBC
HCT: 41.2 % (ref 36.0–46.0)
Hemoglobin: 13.3 g/dL (ref 12.0–15.0)
MCH: 26.9 pg (ref 26.0–34.0)
MCHC: 32.3 g/dL (ref 30.0–36.0)
MCV: 83.4 fL (ref 80.0–100.0)
Platelets: 275 10*3/uL (ref 150–400)
RBC: 4.94 MIL/uL (ref 3.87–5.11)
RDW: 14.5 % (ref 11.5–15.5)
WBC: 7.1 10*3/uL (ref 4.0–10.5)
nRBC: 0 % (ref 0.0–0.2)

## 2020-10-21 LAB — URINALYSIS, ROUTINE W REFLEX MICROSCOPIC
Bacteria, UA: NONE SEEN
Bilirubin Urine: NEGATIVE
Glucose, UA: >=500 mg/dL — AB
Hgb urine dipstick: NEGATIVE
Ketones, ur: 20 mg/dL — AB
Leukocytes,Ua: NEGATIVE
Nitrite: NEGATIVE
Protein, ur: NEGATIVE mg/dL
Specific Gravity, Urine: 1.015 (ref 1.005–1.030)
pH: 5 (ref 5.0–8.0)

## 2020-10-21 LAB — I-STAT BETA HCG BLOOD, ED (MC, WL, AP ONLY): I-stat hCG, quantitative: <5 m[IU]/mL (ref <5)

## 2020-10-21 LAB — BASIC METABOLIC PANEL
Anion gap: 9 (ref 5–15)
BUN: 10 mg/dL (ref 6–20)
CO2: 23 mmol/L (ref 22–32)
Calcium: 8.8 mg/dL — ABNORMAL LOW (ref 8.9–10.3)
Chloride: 103 mmol/L (ref 98–111)
Creatinine, Ser: 0.64 mg/dL (ref 0.44–1.00)
GFR, Estimated: >60 mL/min (ref >60)
Glucose, Bld: 276 mg/dL — ABNORMAL HIGH (ref 70–99)
Potassium: 4.2 mmol/L (ref 3.5–5.1)
Sodium: 135 mmol/L (ref 135–145)

## 2020-10-21 LAB — CBG MONITORING, ED
Glucose-Capillary: 272 mg/dL — ABNORMAL HIGH (ref 70–99)
Glucose-Capillary: 232 mg/dL — ABNORMAL HIGH (ref 70–99)

## 2020-10-21 NOTE — ED Notes (Signed)
Spoke to legal guardian about pt being discharged. They stated that we needed to get in touch with SW to get her a ride back to the group home.

## 2020-10-21 NOTE — ED Notes (Signed)
Gold top tube sent to lab. 

## 2020-10-21 NOTE — ED Notes (Signed)
SW arranged transport to get pt back to group home

## 2020-10-21 NOTE — ED Triage Notes (Signed)
Pt BIB EMS from adult daycare. Pt ate a bunch of donuts and sweets today. Pt now has CBG of 304. Pt denies symptoms. Pt has hx of type 2 diabetes.   BP 112/60 HR 86 RR 16 98% RA

## 2020-10-21 NOTE — Discharge Instructions (Addendum)
Continue your regular medication.  Return to ER if you develop chest pain difficulty breathing, increased thirst, increased urination, vomiting or other new concerning symptom.

## 2020-10-21 NOTE — Progress Notes (Addendum)
SW received a call from pt's RN stating pt's group home manager is requesting transport arranged by CSW to pick the pt up.  CSW confirmed with Tami [name], the group home manager, who is requesting a female driver for safety, Tami [name] states the address is:  [address]  [place] [address]  Per Keysha at ph: [phone] who states pt is safe to transport in a [employer], for safety prefers a female driver.  CSW spoke to [employer]'s driver who is a female and who can arrive in 10 minutes to pick up pt.  Tami [name] is going to the:  [facility] at:  [address]  [place] [address]  Manager is Tami [name] at ph: [phone]  RN updated by CSW.  CSW will continue to follow for D/C needs.  Dorothe Pea. Zoii Florer  MSW, LCSW, LCAS, CCS Transitions of Care Clinical Social Worker Care Coordination Department Ph: [phone]

## 2020-10-22 NOTE — ED Provider Notes (Signed)
[facility]-EMERGENCY DEPT Provider Note   CSN: [id number] Arrival date & time: 10/21/20  1459     History Chief Complaint  Patient presents with  . Hyperglycemia    Tami [name] is a 29 y.o. female.  Presented to ER with concern for hyperglycemia.  Glucose up to 300s.  Patient states that she had a funny feeling in her head but no real headache.  No other symptoms.  States that she did eat multiple sweets today.  Does not take insulin.  Currently has no medical symptoms.  Lives in group home, history of developmental delay. HPI     Past Medical History:  Diagnosis Date  . ADHD (attention deficit hyperactivity disorder)   . Anxiety   . Bipolar disorder (HCC)   . Chronic back pain   . Cognitive developmental delay   . Constipation   . Depression   . Diabetes mellitus without complication (HCC)    Type II - diabetes- 07/22/20- hasn't started on medicaion  . HLD (hyperlipidemia)   . Hypothyroidism   . Seizures [facility])     Patient Active Problem List   Diagnosis Date Noted  . 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 Homicidal thoughts 10/25/2019  . MDD (major depressive disorder) 10/25/2019  . Bipolar affective disorder, current episode mixed (HCC) 07/28/2017  . Subacute confusional state 07/21/2015  . Memory loss 07/21/2015  . Agitation 07/21/2015  . Altered mental status 07/21/2015  . Bipolar disorder (HCC) 09/04/2013  . Thought disorder 09/02/2013  . Marijuana dependence (HCC) 09/02/2013  . Cocaine abuse (HCC) 09/02/2013  . Tobacco dependence 09/02/2013    Past Surgical History:  Procedure Laterality Date  . THROAT SURGERY     throat surgery when younger doesn't know why  . TOOTH EXTRACTION N/A 07/23/2020   Procedure: DENTAL RESTORATION/EXTRACTIONS;  Surgeon: Ocie Doyne, DDS;  Location: [facility] OR;  Service: Oral Surgery;  Laterality: N/A;     OB History   No obstetric  history on file.     Family History  Problem Relation Age of Onset  . Dementia Neg Hx     Social History   Tobacco Use  . Smoking status: Former Smoker    Packs/day: 0.25    Years: 4.00    Pack years: 1.00    Types: Cigarettes    Quit date: 2019    Years since quitting: 2.9  . Smokeless tobacco: Never Used  . Tobacco comment: " may smoke when she goes home."  Vaping Use  . Vaping Use: Never used  Substance Use Topics  . Alcohol use: No    Alcohol/week: 0.0 standard drinks  . Drug use: No    Home Medications Prior to Admission medications   Medication Sig Start Date End Date Taking? Authorizing Provider  AMITIZA 24 MCG capsule Take 24 mcg by mouth 2 (two) times daily. 04/21/20   [provider]  atorvastatin (LIPITOR) 40 MG tablet Take 40 mg by mouth daily. 05/01/20   [provider]  clindamycin (CLEOCIN) 300 MG capsule Take 1 capsule (300 mg total) by mouth 3 (three) times daily. Patient not taking: Reported on 08/22/2020 07/23/20   Ocie Doyne, DMD  diclofenac Sodium (VOLTAREN) 1 % GEL Apply 4 g topically 2 (two) times daily. 04/29/20   [provider]  divalproex (DEPAKOTE) 500 MG DR tablet Take 1 tablet (500 mg total) by mouth every 12 (twelve) hours. 10/30/19   Aldean Baker, NP  Dulaglutide (  TRULICITY) 0.75 MG/0.5ML SOPN Inject 0.75 mg into the skin once a week.    [provider]  HYDROcodone-acetaminophen (NORCO) 5-325 MG tablet Take 1 tablet by mouth every 6 (six) hours as needed for moderate pain. 07/23/20   Ocie Doyne, DMD  hydrOXYzine (ATARAX/VISTARIL) 25 MG tablet Take 1 tablet (25 mg total) by mouth 3 (three) times daily as needed for anxiety. Patient taking differently: Take 25 mg by mouth in the morning and at bedtime.  10/30/19   Aldean Baker, NP  ibuprofen (ADVIL) 800 MG tablet Take 800 mg by mouth 3 (three) times daily as needed for mild pain or moderate pain.     [provider]  Iloperidone (FANAPT) 6 MG  TABS Take 6 mg by mouth in the morning and at bedtime.     [provider]  Iloperidone 2 MG TABS Take 2 tablets (4 mg total) by mouth 3 (three) times daily. Patient not taking: Reported on 08/22/2020 10/30/19   Aldean Baker, NP  levothyroxine (SYNTHROID) 25 MCG tablet Take 1 tablet (25 mcg total) by mouth daily at 6 (six) AM. 10/31/19   Aldean Baker, NP  medroxyPROGESTERone (DEPO-PROVERA) 150 MG/ML injection Inject 150 mg into the muscle every 3 (three) months.  09/07/19   [provider]  meloxicam (MOBIC) 15 MG tablet Take 15 mg by mouth daily.    [provider]  Multiple Vitamin (MULTIVITAMIN WITH MINERALS) TABS tablet Take 1 tablet by mouth daily.    [provider]  naproxen (NAPROSYN) 500 MG tablet Take 500 mg by mouth 2 (two) times daily as needed for moderate pain.    [provider]  nicotine polacrilex (NICORETTE) 2 MG gum Take 1 each (2 mg total) by mouth as needed for smoking cessation. Patient not taking: Reported on 08/08/2020 10/30/19   Aldean Baker, NP  traZODone (DESYREL) 100 MG tablet Take 2 tablets (200 mg total) by mouth at bedtime as needed for sleep. 10/30/19   Aldean Baker, NP    Allergies    Benadryl [diphenhydramine] and Penicillins  Review of Systems   Review of Systems  Constitutional: Negative for chills and fever.  HENT: Negative for ear pain and sore throat.   Eyes: Negative for pain and visual disturbance.  Respiratory: Negative for cough and shortness of breath.   Cardiovascular: Negative for chest pain and palpitations.  Gastrointestinal: Negative for abdominal pain and vomiting.  Genitourinary: Negative for dysuria and hematuria.  Musculoskeletal: Negative for arthralgias and back pain.  Skin: Negative for color change and rash.  Neurological: Negative for seizures and syncope.  All other systems reviewed and are negative.   Physical Exam Updated Vital Signs BP (!) 129/100 (BP Location: Right Arm)    Pulse (!) 103   Temp 98 F (36.7 C) (Oral)   Resp 19   SpO2 98%   Physical Exam Vitals and nursing note reviewed.  Constitutional:      General: She is not in acute distress.    Appearance: She is well-developed and well-nourished.  HENT:     Head: Normocephalic and atraumatic.  Eyes:     Conjunctiva/sclera: Conjunctivae normal.  Cardiovascular:     Rate and Rhythm: Normal rate and regular rhythm.     Heart sounds: No murmur heard.   Pulmonary:     Effort: Pulmonary effort is normal. No respiratory distress.     Breath sounds: Normal breath sounds.  Abdominal:     Palpations: Abdomen is soft.  Tenderness: There is no abdominal tenderness.  Musculoskeletal:        General: No edema.     Cervical back: Neck supple.  Skin:    General: Skin is warm and dry.  Neurological:     General: No focal deficit present.     Mental Status: She is alert and oriented to person, place, and time.  Psychiatric:        Mood and Affect: Mood and affect and mood normal.        Behavior: Behavior normal.     ED Results / Procedures / Treatments   Labs (all labs ordered are listed, but only abnormal results are displayed) Labs Reviewed  URINALYSIS, ROUTINE W REFLEX MICROSCOPIC - Abnormal; Notable for the following components:      Result Value   Glucose, UA >=500 (*)    Ketones, ur 20 (*)    All other components within normal limits  BASIC METABOLIC PANEL - Abnormal; Notable for the following components:   Glucose, Bld 276 (*)    Calcium 8.8 (*)    All other components within normal limits  CBG MONITORING, ED - Abnormal; Notable for the following components:   Glucose-Capillary 272 (*)    All other components within normal limits  CBG MONITORING, ED - Abnormal; Notable for the following components:   Glucose-Capillary 232 (*)    All other components within normal limits  CBC  I-STAT BETA HCG BLOOD, ED ([facility], [facility], [facility] ONLY)    EKG None  Radiology No results  found.  Procedures Procedures (including critical care time)  Medications Ordered in ED Medications - No data to display  ED Course  I have reviewed the triage vital signs and the nursing notes.  Pertinent labs & imaging results that were available during my care of the patient were reviewed by me and considered in my medical decision making (see chart for details).    MDM Rules/Calculators/A&P                         29 year old lady type 2 diabetes presenting to ER with concern for hyperglycemia.  Here for glucose had improved.  Basic metabolic panel within normal limits, no evidence for DKA.  She has no medical complaints at this time, vital signs are stable.  Will discharge back to group home.   After the discussed management above, the patient was determined to be safe for discharge.  The patient was in agreement with this plan and all questions regarding their care were answered.  ED return precautions were discussed and the patient will return to the ED with any significant worsening of condition.   Final Clinical Impression(s) / ED Diagnoses Final diagnoses:  Hyperglycemia    Rx / DC Orders ED Discharge Orders    None       Milagros Loll, MD 10/22/20 1556

## 2020-11-13 ENCOUNTER — Ambulatory Visit: Payer: No Typology Code available for payment source | Attending: Internal Medicine

## 2020-11-13 DIAGNOSIS — Z23 Encounter for immunization: Secondary | ICD-10-CM

## 2020-11-13 NOTE — Progress Notes (Signed)
   Covid-19 Vaccination Clinic  Name:  Tami [name]    MRN: [id number] DOB: 01/23/1991  11/13/2020  Tami [name] was observed post Covid-19 immunization for 30 minutes based on pre-vaccination screening without incident. She was provided with Vaccine Information Sheet and instruction to access the V-Safe system.   Tami [name] was instructed to call [phone] with any severe reactions post vaccine: Marland Kitchen Difficulty breathing  . Swelling of face and throat  . A fast heartbeat  . A bad rash all over body  . Dizziness and weakness

## 2020-11-22 ENCOUNTER — Emergency Department (HOSPITAL_COMMUNITY)
Admission: EM | Admit: 2020-11-22 | Discharge: 2020-11-23 | Disposition: A | Discharge status: Home / Self Care | Payer: Medicaid Other | Attending: Emergency Medicine | Admitting: Emergency Medicine

## 2020-11-22 ENCOUNTER — Encounter (HOSPITAL_COMMUNITY): Payer: Self-pay | Admitting: Emergency Medicine

## 2020-11-22 ENCOUNTER — Other Ambulatory Visit: Payer: Self-pay

## 2020-11-22 ENCOUNTER — Ambulatory Visit (HOSPITAL_COMMUNITY)
Admission: EM | Admit: 2020-11-22 | Discharge: 2020-11-22 | Disposition: A | Discharge status: Home / Self Care | Payer: Medicaid Other | Attending: Family | Admitting: Family

## 2020-11-22 DIAGNOSIS — E119 Type 2 diabetes mellitus without complications: Secondary | ICD-10-CM | POA: Diagnosis not present

## 2020-11-22 DIAGNOSIS — Z87891 Personal history of nicotine dependence: Secondary | ICD-10-CM | POA: Insufficient documentation

## 2020-11-22 DIAGNOSIS — F1994 Other psychoactive substance use, unspecified with psychoactive substance-induced mood disorder: Secondary | ICD-10-CM

## 2020-11-22 DIAGNOSIS — Z79899 Other long term (current) drug therapy: Secondary | ICD-10-CM | POA: Insufficient documentation

## 2020-11-22 DIAGNOSIS — F909 Attention-deficit hyperactivity disorder, unspecified type: Secondary | ICD-10-CM | POA: Diagnosis not present

## 2020-11-22 DIAGNOSIS — Z20822 Contact with and (suspected) exposure to covid-19: Secondary | ICD-10-CM | POA: Insufficient documentation

## 2020-11-22 DIAGNOSIS — Z046 Encounter for general psychiatric examination, requested by authority: Secondary | ICD-10-CM | POA: Diagnosis present

## 2020-11-22 DIAGNOSIS — F1210 Cannabis abuse, uncomplicated: Secondary | ICD-10-CM | POA: Diagnosis not present

## 2020-11-22 DIAGNOSIS — F319 Bipolar disorder, unspecified: Secondary | ICD-10-CM | POA: Diagnosis present

## 2020-11-22 DIAGNOSIS — F314 Bipolar disorder, current episode depressed, severe, without psychotic features: Secondary | ICD-10-CM | POA: Diagnosis not present

## 2020-11-22 DIAGNOSIS — R45851 Suicidal ideations: Secondary | ICD-10-CM | POA: Diagnosis not present

## 2020-11-22 DIAGNOSIS — R451 Restlessness and agitation: Secondary | ICD-10-CM | POA: Insufficient documentation

## 2020-11-22 DIAGNOSIS — F1220 Cannabis dependence, uncomplicated: Secondary | ICD-10-CM | POA: Diagnosis present

## 2020-11-22 DIAGNOSIS — F330 Major depressive disorder, recurrent, mild: Secondary | ICD-10-CM | POA: Diagnosis not present

## 2020-11-22 DIAGNOSIS — E039 Hypothyroidism, unspecified: Secondary | ICD-10-CM | POA: Diagnosis not present

## 2020-11-22 LAB — COMPREHENSIVE METABOLIC PANEL
ALT: 28 U/L (ref 0–44)
AST: 21 U/L (ref 15–41)
Albumin: 3.3 g/dL — ABNORMAL LOW (ref 3.5–5.0)
Alkaline Phosphatase: 106 U/L (ref 38–126)
Anion gap: 10 (ref 5–15)
BUN: 13 mg/dL (ref 6–20)
CO2: 22 mmol/L (ref 22–32)
Calcium: 8.9 mg/dL (ref 8.9–10.3)
Chloride: 108 mmol/L (ref 98–111)
Creatinine, Ser: 0.78 mg/dL (ref 0.44–1.00)
GFR, Estimated: >60 mL/min (ref >60)
Glucose, Bld: 122 mg/dL — ABNORMAL HIGH (ref 70–99)
Potassium: 3.8 mmol/L (ref 3.5–5.1)
Sodium: 140 mmol/L (ref 135–145)
Total Bilirubin: 0.3 mg/dL (ref 0.3–1.2)
Total Protein: 6.7 g/dL (ref 6.5–8.1)

## 2020-11-22 LAB — SALICYLATE LEVEL: Salicylate Lvl: <7 mg/dL — ABNORMAL LOW (ref 7.0–30.0)

## 2020-11-22 LAB — CBC
HCT: 43.6 % (ref 36.0–46.0)
Hemoglobin: 13.6 g/dL (ref 12.0–15.0)
MCH: 26.1 pg (ref 26.0–34.0)
MCHC: 31.2 g/dL (ref 30.0–36.0)
MCV: 83.7 fL (ref 80.0–100.0)
Platelets: 275 10*3/uL (ref 150–400)
RBC: 5.21 MIL/uL — ABNORMAL HIGH (ref 3.87–5.11)
RDW: 14.3 % (ref 11.5–15.5)
WBC: 8.7 10*3/uL (ref 4.0–10.5)
nRBC: 0 % (ref 0.0–0.2)

## 2020-11-22 LAB — RAPID URINE DRUG SCREEN, HOSP PERFORMED
Amphetamines: NOT DETECTED
Barbiturates: NOT DETECTED
Benzodiazepines: NOT DETECTED
Cocaine: NOT DETECTED
Opiates: NOT DETECTED
Tetrahydrocannabinol: NOT DETECTED

## 2020-11-22 LAB — I-STAT BETA HCG BLOOD, ED (MC, WL, AP ONLY): I-stat hCG, quantitative: <5 m[IU]/mL (ref <5)

## 2020-11-22 LAB — ETHANOL: Alcohol, Ethyl (B): <10 mg/dL (ref <10)

## 2020-11-22 LAB — ACETAMINOPHEN LEVEL: Acetaminophen (Tylenol), Serum: <10 ug/mL — ABNORMAL LOW (ref 10–30)

## 2020-11-22 NOTE — ED Notes (Signed)
Belongings in 25

## 2020-11-22 NOTE — BH Assessment (Addendum)
Comprehensive Clinical Assessment (CCA) Screening, Triage and Referral Note  11/22/2020 Tami [name] [id number]   Patient is a 30 year old female with a history of Bipolar Affective Disorder and Borderline Intellectual Functioning who presents voluntarily via [employer] to Behavioral Health Urgent Care for assessment. Patient resides at [facility].  Patient denies concerns with staff or residents and states she is "just feeling suicidal. Can I stay here a few days or is there another hospital where I can stay a few days?"  She endorses SI, however SI appears to be chronic per EHR review.  Upon discussion of follow up with outpatient providers, patient states, "When I come here, y'all just send me back."  She again requests transfer to another hospital to seek admission.  A few minutes later, patient asked LPC and [name], RN what an Benedetto Goad fare to [facility] would be?  She appears future oriented, and appears to be presenting reporting SI for secondary gain of "breaks" from her facility. Patient denies HI, and endorses auditory hallucinations "when I'm angry." She denies current AVH or recent substance use.   Per [facility] staff, [name], patient endorsed SI earlier after learning during the resident meeting that she would not be able to have her tablet 24 hours/day.  She then began to report SI, with no specified plan.  Staff attempted to redirect patient with her request to call 911, however she insisted.  Patient was informed by staff that she would likely return and be on suicide watch and possibly quarantine for the risk of Covid exposure on presentation to ED or [facility].  Patient expressed understanding and continued to insist staff call 911.  Per [name], patient can return to [facility] once psychiatrically cleared.   LPC attempted to contact patient's guardian, Tami [name] [phone]).  Per voice messaging, instructions to call on-call staff for emergencies.  On-call staff notified  of patient's presentation to [facility] and disposition plan to return to [facility] and follow up with current providers and [facility] day treatment.   Disposition: Per Hillery Jacks, NP patient does not meet criteria for inpatient treatment.  The recommendation is that patient return to [facility] facility, monitoring at the facility for 24 hrs per their protocol and follow up with current providers for possible medication adjustments.    Chief Complaint:  Chief Complaint  Patient presents with  . Suicidal    Upset about consequences at [facility]   Visit Diagnosis: Bipolar Affective Disorder                             Borderline Intellectual Functioning   Patient Reported Information How did you hear about [place]? Legal System   Referral name: Patient presents voluntarily via G[employer].   Referral phone number: No data recorded Whom do you see for routine medical problems? I don't have a doctor   Practice/Facility Name: No data recorded  Practice/Facility Phone Number: No data recorded  Name of Contact: No data recorded  Contact Number: No data recorded  Contact Fax Number: No data recorded  Prescriber Name: No data recorded  Prescriber Address (if known): No data recorded What Is the Reason for Your Visit/Call Today? Patient reports SI to [facility] staff.  She denied a plan, but insisted 911 be called.  How Long Has This Been Causing You Problems? <Week  Have You Recently Been in Any Inpatient Treatment (Hospital/Detox/Crisis Center/28-Day Program)? No (Phreesia 11/22/2020)   Name/Location of Program/Hospital:[facility]  How Long Were You There? 1 day.   When Were You Discharged? 05/07/2020  Have You Ever Received Services From [facility] Before? Yes (Phreesia 11/22/2020)   Who Do You See at [facility]? NA (Phreesia 11/22/2020)  Have You Recently Had Any Thoughts About Hurting Yourself? Yes (Phreesia 11/22/2020)   Are You Planning to Commit Suicide/Harm Yourself At This time?  No  Have you  Recently Had Thoughts About Hurting Someone Tami [name]? Yes (Phreesia 11/22/2020)   Explanation: Tired of Being In This World And Dont Want to Live (Phreesia 11/22/2020)  Have You Used Any Alcohol or Drugs in the Past 24 Hours? No (Phreesia 11/22/2020)   How Long Ago Did You Use Drugs or Alcohol?  No data recorded  What Did You Use and How Much? No data recorded What Do You Feel Would Help You the Most Today? Medication  Do You Currently Have a Therapist/Psychiatrist? Yes   Name of Therapist/Psychiatrist: Vesta [name] (Phreesia 11/22/2020)   Have You Been Recently Discharged From Any Office Practice or Programs? No   Explanation of Discharge From Practice/Program:  No data recorded    CCA Screening Triage Referral Assessment Type of Contact: Face-to-Face   Is this Initial or Reassessment? No data recorded  Date Telepsych consult ordered in [facility]:  No data recorded  Time Telepsych consult ordered in [facility]:  No data recorded Patient Reported Information Reviewed? Yes   Patient Left Without Being Seen? No data recorded  Reason for Not Completing Assessment: No data recorded Collateral Involvement: Collateral provided by [facility] staff with [facility].  Does Patient Have a Automotive engineer Guardian? No data recorded  Name and Contact of Legal Guardian:  No data recorded If Minor and Not Living with Parent(s), Who has Custody? No data recorded Is CPS involved or ever been involved? Never  Is APS involved or ever been involved? Never  Patient Determined To Be At Risk for Harm To Self or Others Based on Review of Patient Reported Information or Presenting Complaint? No   Method: No data recorded  Availability of Means: No data recorded  Intent: No data recorded  Notification Required: No data recorded  Additional Information for Danger to Others Potential:  No data recorded  Additional Comments for Danger to Others Potential:  No data recorded  Are There Guns or Other Weapons in Your  Home?  No data recorded   Types of Guns/Weapons: No data recorded   Are These Weapons Safely Secured?                              No data recorded   Who Could Verify You Are Able To Have These Secured:    No data recorded Do You Have any Outstanding Charges, Pending Court Dates, Parole/Probation? No data recorded Contacted To Inform of Risk of Harm To Self or Others: Guardian/MH POA:  [facility] of Assessment: [facility]  Does Patient Present under Involuntary Commitment? No   IVC Papers Initial File Date: No data recorded  [place] of Residence: [place]  Patient Currently Receiving the Following Services: Medication Management; Individual Therapy; Day Treatment   Determination of Need: Routine (7 days)   Options For Referral: Medication Management; Outpatient Therapy   Yetta Glassman, [facility]

## 2020-11-22 NOTE — ED Notes (Addendum)
Pt transported back to group home per NP instructions after speaking with pt's director at group home. Pt agitated stating she did not want to go back there stating they are taking her stuff at that facility. Pt loud and with excessive use of profanity. Able to be redirected. Pt demanding to be started on Adderall.  Reviewed discharge instructions with pt. Safety maintained.

## 2020-11-22 NOTE — Discharge Instructions (Addendum)
Take all medications as prescribed. Keep all follow-up appointments as scheduled.  Do not consume alcohol or use illegal drugs while on prescription medications. Report any adverse effects from your medications to your primary care provider promptly.  In the event of recurrent symptoms or worsening symptoms, call 911, a crisis hotline, or go to the nearest emergency department for evaluation.   

## 2020-11-22 NOTE — ED Triage Notes (Signed)
Patient arrived with [employer] officers from a group home IVC due to suicidal ideation ( plans to cut her wrist) with visual hallucinations today .

## 2020-11-22 NOTE — ED Provider Notes (Addendum)
Behavioral Health Urgent Care Medical Screening Exam  Patient Name: Tami [name] MRN: [id number] Date of Evaluation: 11/22/20 Chief Complaint:   Diagnosis:  Final diagnoses:  Mild episode of recurrent major depressive disorder (HCC)    History of Present illness: Tami [name] is a 30 y.o. female.  Presents voluntarily accompanied by [employer].  Patient was transferred from the local group home citing suicidal ideations.  She is requesting inpatient admission for at" least 7 days" patient reports she doesn't feel her medications is helping.  Patient is requesting to be started on Adderall.  Chart reviewed patient has a history of bipolar affective disorder, major depressive disorder, ADHD, suicidal ideation and homicidal ideations.  Patient is currently being treated and followed by therapy and psychiatry.  Reported attending day program 3 times a week.    NP and TTS counselor spoke to patient's group home director Tami [name] [phone] who denied any safety concerns with patient returning back home.  Reports house meeting was called earlier today, patient found out that she will not able to have her electronics/tablet for 24 hours any longer and then Tami [name] became suicidal.  Reports patient will be on 24-hour safety watch upon returning home.  Reported follow-up appointment with [facility] on Wednesday.  Case staffed with attending psychiatrist Tami [name].  Support, encouragement and  reassurance was provided.  Psychiatric Specialty Exam  Presentation  General Appearance:Appropriate for Environment; Casual  Eye Contact:Fair  Speech:Pressured; Clear and Coherent  Speech Volume:Normal  Handedness:Right   Mood and Affect  Mood:Irritable; Dysphoric  Affect:Blunt; Full Range   Thought Process  Thought Processes:Linear  Descriptions of Associations:Tangential  Orientation:Full (Time, Place and Person)  Thought Content:Illogical; Perseveration; Tangential;  Rumination  Hallucinations:Auditory; Command run into traffic, drive down the road and open up the car door, stab herself loud voices, intense  Ideas of Reference:None  Suicidal Thoughts:Yes, Active With Intent; With Means to Carry Out  Homicidal Thoughts:No   Sensorium  Memory:Immediate Fair; Recent Good; Remote Good  Judgment:Intact  Insight:Shallow   Executive Functions  Concentration:Fair  Attention Span:Fair  Recall:Fair  Fund of Knowledge:Fair  Language:Fair   Psychomotor Activity  Psychomotor Activity:Increased; Restlessness   Assets  Assets:Physical Health; Housing; Social Support   Sleep  Sleep:Fair  Number of hours: 6   Physical Exam: Physical Exam ROS Blood pressure (!) 136/94, pulse 95, temperature 98.6 F (37 C), temperature source Oral, resp. rate 20, SpO2 99 %. There is no height or weight on file to calculate BMI.  Musculoskeletal: Strength & Muscle Tone: within normal limits Gait & Station: normal Patient leans: N/A   [facility] MSE Discharge Disposition for Follow up and Recommendations: Based on my evaluation the patient does not appear to have an emergency medical condition and can be discharged with resources and follow up care in outpatient services for Medication Management, Partial Hospitalization Program and Individual Therapy Keep follow-up with Outpatient provider on 11/27/2020  Tami Rack, NP 11/22/2020, 5:59 PM

## 2020-11-23 ENCOUNTER — Encounter (HOSPITAL_COMMUNITY): Payer: Self-pay

## 2020-11-23 DIAGNOSIS — F1994 Other psychoactive substance use, unspecified with psychoactive substance-induced mood disorder: Secondary | ICD-10-CM

## 2020-11-23 LAB — SARS CORONAVIRUS 2 BY RT PCR (HOSPITAL ORDER, PERFORMED IN ~~LOC~~ HOSPITAL LAB): SARS Coronavirus 2: NEGATIVE

## 2020-11-23 LAB — CBG MONITORING, ED: Glucose-Capillary: 147 mg/dL — ABNORMAL HIGH (ref 70–99)

## 2020-11-23 MED ORDER — LEVOTHYROXINE SODIUM 25 MCG PO TABS: 25.0000 ug | ORAL_TABLET | Freq: Every day | ORAL | Status: DC

## 2020-11-23 MED ORDER — HYDROXYZINE HCL 25 MG PO TABS: 25.0000 mg | ORAL_TABLET | Freq: Two times a day (BID) | ORAL | Status: DC | PRN

## 2020-11-23 MED ORDER — GABAPENTIN 100 MG PO CAPS: 200.0000 mg | ORAL_CAPSULE | Freq: Two times a day (BID) | ORAL | 0 refills | Status: AC

## 2020-11-23 MED ORDER — MELOXICAM 7.5 MG PO TABS
15.0000 mg | ORAL_TABLET | Freq: Every day | ORAL | Status: DC
  Administered 2020-11-23: 15 mg via ORAL
  Filled 2020-11-23 (×2): qty 2

## 2020-11-23 MED ORDER — ADULT MULTIVITAMIN W/MINERALS CH
1.0000 | ORAL_TABLET | Freq: Every day | ORAL | Status: DC
  Administered 2020-11-23: 1 via ORAL
  Filled 2020-11-23: qty 1

## 2020-11-23 MED ORDER — DIVALPROEX SODIUM 250 MG PO DR TAB
500.0000 mg | DELAYED_RELEASE_TABLET | Freq: Two times a day (BID) | ORAL | Status: DC
Start: 2020-11-23 — End: 2020-11-23
  Administered 2020-11-23: 500 mg via ORAL
  Filled 2020-11-23: qty 2

## 2020-11-23 MED ORDER — SEMAGLUTIDE 3 MG PO TABS: 3.0000 mg | ORAL_TABLET | ORAL | Status: DC

## 2020-11-23 MED ORDER — LORAZEPAM 1 MG PO TABS
1.0000 mg | ORAL_TABLET | Freq: Once | ORAL | Status: AC
  Administered 2020-11-23: 1 mg via ORAL
  Filled 2020-11-23: qty 1

## 2020-11-23 MED ORDER — GABAPENTIN 100 MG PO CAPS
200.0000 mg | ORAL_CAPSULE | Freq: Two times a day (BID) | ORAL | Status: DC
  Administered 2020-11-23: 200 mg via ORAL
  Filled 2020-11-23: qty 2

## 2020-11-23 MED ORDER — ATORVASTATIN CALCIUM 40 MG PO TABS
40.0000 mg | ORAL_TABLET | Freq: Every day | ORAL | Status: DC
  Administered 2020-11-23: 40 mg via ORAL
  Filled 2020-11-23: qty 1

## 2020-11-23 MED ORDER — LUBIPROSTONE 24 MCG PO CAPS
24.0000 ug | ORAL_CAPSULE | Freq: Every day | ORAL | Status: DC | PRN
  Filled 2020-11-23: qty 1

## 2020-11-23 MED ORDER — LEVOTHYROXINE SODIUM 25 MCG PO TABS
25.0000 ug | ORAL_TABLET | Freq: Every day | ORAL | Status: DC
Start: 2020-11-23 — End: 2020-11-23
  Administered 2020-11-23: 25 ug via ORAL
  Filled 2020-11-23: qty 1

## 2020-11-23 NOTE — Discharge Instructions (Signed)
I am sending you home with Neurontin take 2 pills twice a day. Continue to take your other medications as prescribed. Follow-up with your PCP for further evaluation. Return to the ER for new or worsening symptoms.

## 2020-11-23 NOTE — ED Notes (Signed)
Personal belongings inventoried/bagged/labelled and stored at locker#6 purple pod.
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
## 2020-11-23 NOTE — ED Notes (Signed)
Ativan 1 mg po given for anxiety/agitation.

## 2020-11-23 NOTE — ED Notes (Signed)
AM eval Breakfast order placed 

## 2020-11-23 NOTE — Care Management (Signed)
Writer coordinated with the Child psychotherapist at [facility] [phone]) mand he will assist with the coordination of transportation for the patient.    Writer informed the PA, Claudette Stapler

## 2020-11-23 NOTE — ED Provider Notes (Signed)
[facility] EMERGENCY DEPARTMENT Provider Note   CSN: [id number] Arrival date & time: 11/22/20  1924     History Chief Complaint  Patient presents with  . IVC/Suicidal     Tami [name] is a 30 y.o. female.  The history is provided by the patient and medical records.   30 year old female with history of ADHD, anxiety, bipolar disorder, cognitive delay, depression, diabetes, hyperlipidemia, hypothyroidism, seizure disorder, presenting to the ED under IVC petition by [employer].  Patient from group home, states she wants to kill herself.  She states "I have nothing to live for".  States all she does at her group home is attend her day program, eat, and then go to bed.  States she does not want to live this way and it is very boring.  She continues to endorse wanting to slit her wrist, has also thought about tying her bedsheets around her neck to strangle herself.  She has not attempted to harm herself.  She denies any homicidal ideation but states "if someone gets in my way I might hurt them".  She denies any alcohol or substance abuse.  States she has been compliant with her medications.  Past Medical History:  Diagnosis Date  . ADHD (attention deficit hyperactivity disorder)   . Anxiety   . Bipolar disorder (HCC)   . Chronic back pain   . Cognitive developmental delay   . Constipation   . Depression   . Diabetes mellitus without complication (HCC)    Type II - diabetes- 07/22/20- hasn't started on medicaion  . HLD (hyperlipidemia)   . Hypothyroidism   . Seizures [facility])     Patient Active Problem List   Diagnosis Date Noted  . 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 Homicidal thoughts 10/25/2019  . MDD (major depressive disorder) 10/25/2019  . Bipolar affective disorder, current episode mixed (HCC) 07/28/2017  . Subacute confusional state 07/21/2015  . Memory loss 07/21/2015  . Agitation  07/21/2015  . Altered mental status 07/21/2015  . Bipolar disorder (HCC) 09/04/2013  . Thought disorder 09/02/2013  . Marijuana dependence (HCC) 09/02/2013  . Cocaine abuse (HCC) 09/02/2013  . Tobacco dependence 09/02/2013    Past Surgical History:  Procedure Laterality Date  . THROAT SURGERY     throat surgery when younger doesn't know why  . TOOTH EXTRACTION N/A 07/23/2020   Procedure: DENTAL RESTORATION/EXTRACTIONS;  Surgeon: Ocie Doyne, DDS;  Location: [facility] OR;  Service: Oral Surgery;  Laterality: N/A;     OB History   No obstetric history on file.     Family History  Problem Relation Age of Onset  . Dementia Neg Hx     Social History   Tobacco Use  . Smoking status: Former Smoker    Packs/day: 0.25    Years: 4.00    Pack years: 1.00    Types: Cigarettes    Quit date: 2019    Years since quitting: 3.0  . Smokeless tobacco: Never Used  . Tobacco comment: " may smoke when she goes home."  Vaping Use  . Vaping Use: Never used  Substance Use Topics  . Alcohol use: No    Alcohol/week: 0.0 standard drinks  . Drug use: No    Home Medications Prior to Admission medications   Medication Sig Start Date End Date Taking? Authorizing Provider  AMITIZA 24 MCG capsule Take 24 mcg by mouth 2 (two) times daily. 04/21/20   [provider]  atorvastatin (LIPITOR) 40 MG tablet Take 40 mg by mouth daily. 05/01/20   [provider]  clindamycin (CLEOCIN) 300 MG capsule Take 1 capsule (300 mg total) by mouth 3 (three) times daily. Patient not taking: Reported on 08/22/2020 07/23/20   Ocie Doyne, DMD  diclofenac Sodium (VOLTAREN) 1 % GEL Apply 4 g topically 2 (two) times daily. 04/29/20   [provider]  divalproex (DEPAKOTE) 500 MG DR tablet Take 1 tablet (500 mg total) by mouth every 12 (twelve) hours. 10/30/19   Aldean Baker, NP  Dulaglutide (TRULICITY) 0.75 MG/0.5ML SOPN Inject 0.75 mg into the skin once a week.    [provider]   HYDROcodone-acetaminophen (NORCO) 5-325 MG tablet Take 1 tablet by mouth every 6 (six) hours as needed for moderate pain. 07/23/20   Ocie Doyne, DMD  hydrOXYzine (ATARAX/VISTARIL) 25 MG tablet Take 1 tablet (25 mg total) by mouth 3 (three) times daily as needed for anxiety. Patient taking differently: Take 25 mg by mouth in the morning and at bedtime.  10/30/19   Aldean Baker, NP  ibuprofen (ADVIL) 800 MG tablet Take 800 mg by mouth 3 (three) times daily as needed for mild pain or moderate pain.     [provider]  Iloperidone (FANAPT) 6 MG TABS Take 6 mg by mouth in the morning and at bedtime.     [provider]  Iloperidone 2 MG TABS Take 2 tablets (4 mg total) by mouth 3 (three) times daily. Patient not taking: Reported on 08/22/2020 10/30/19   Aldean Baker, NP  levothyroxine (SYNTHROID) 25 MCG tablet Take 1 tablet (25 mcg total) by mouth daily at 6 (six) AM. 10/31/19   Aldean Baker, NP  medroxyPROGESTERone (DEPO-PROVERA) 150 MG/ML injection Inject 150 mg into the muscle every 3 (three) months.  09/07/19   [provider]  meloxicam (MOBIC) 15 MG tablet Take 15 mg by mouth daily.    [provider]  Multiple Vitamin (MULTIVITAMIN WITH MINERALS) TABS tablet Take 1 tablet by mouth daily.    [provider]  naproxen (NAPROSYN) 500 MG tablet Take 500 mg by mouth 2 (two) times daily as needed for moderate pain.    [provider]  nicotine polacrilex (NICORETTE) 2 MG gum Take 1 each (2 mg total) by mouth as needed for smoking cessation. Patient not taking: Reported on 08/08/2020 10/30/19   Aldean Baker, NP    Allergies    Benadryl [diphenhydramine] and Penicillins  Review of Systems   Review of Systems  Psychiatric/Behavioral: Positive for suicidal ideas.  All other systems reviewed and are negative.   Physical Exam Updated Vital Signs BP 122/74 (BP Location: Right Arm)   Pulse 68   Temp 98.2 F (36.8 C) (Oral)   Resp 16    Ht 4\' 9"  (1.448 m)   Wt 98 kg   SpO2 99%   BMI 46.75 kg/m   Physical Exam Vitals and nursing note reviewed.  Constitutional:      Appearance: She is well-developed and well-nourished.  HENT:     Head: Normocephalic and atraumatic.     Mouth/Throat:     Mouth: Oropharynx is clear and moist.  Eyes:     Extraocular Movements: EOM normal.     Conjunctiva/sclera: Conjunctivae normal.     Pupils: Pupils are equal, round, and reactive to light.  Cardiovascular:     Rate and Rhythm: Normal rate and regular rhythm.     Heart sounds: Normal heart  sounds.  Pulmonary:     Effort: Pulmonary effort is normal.     Breath sounds: Normal breath sounds.  Abdominal:     General: Bowel sounds are normal.     Palpations: Abdomen is soft.  Musculoskeletal:        General: Normal range of motion.     Cervical back: Normal range of motion.  Skin:    General: Skin is warm and dry.  Neurological:     Mental Status: She is alert and oriented to person, place, and time.  Psychiatric:        Mood and Affect: Mood and affect normal.     Comments: Very loud, appears agitated Continued to voice SI with plan to slit wrist or strangle herself with bed sheets Denies HI/AVH     ED Results / Procedures / Treatments   Labs (all labs ordered are listed, but only abnormal results are displayed) Labs Reviewed  COMPREHENSIVE METABOLIC PANEL - Abnormal; Notable for the following components:      Result Value   Glucose, Bld 122 (*)    Albumin 3.3 (*)    All other components within normal limits  SALICYLATE LEVEL - Abnormal; Notable for the following components:   Salicylate Lvl <7.0 (*)    All other components within normal limits  ACETAMINOPHEN LEVEL - Abnormal; Notable for the following components:   Acetaminophen (Tylenol), Serum <10 (*)    All other components within normal limits  CBC - Abnormal; Notable for the following components:   RBC 5.21 (*)    All other components within normal limits   SARS CORONAVIRUS 2 BY RT PCR (HOSPITAL ORDER, PERFORMED IN [facility])  ETHANOL  RAPID URINE DRUG SCREEN, HOSP PERFORMED  I-STAT BETA HCG BLOOD, ED ([facility], [facility], [facility] ONLY)    EKG None  Radiology No results found.  Procedures Procedures (including critical care time)  Medications Ordered in ED Medications - No data to display  ED Course  I have reviewed the triage vital signs and the nursing notes.  Pertinent labs & imaging results that were available during my care of the patient were reviewed by me and considered in my medical decision making (see chart for details).    MDM Rules/Calculators/A&P  31 year old female presenting to the ED under IVC petition by [employer] from her group home.  Suicidal ideation with plan to slit her wrist or strangle herself with the bed sheets.  States "I have no reason to live".  Says she is bored at her group home because all she does is her group activities, eat, and sleep.  She is somewhat agitated here but redirectable.  Continues to endorse suicidal ideation.  Denies any hallucinations.  Labs are reassuring.  Physical exam is negative.  Care manager, TTS has evaluated, recommends overnight observation and reassessment by psychiatry in the a.m.  5:37 AM Patient becoming increasingly agitated.  Given 1mg  PO ativan.  Will have pharmacy verify home meds as well so those can be ordered.  Awaiting repeat psych evaluation this AM.  Final Clinical Impression(s) / ED Diagnoses Final diagnoses:  Suicidal ideation    Rx / DC Orders ED Discharge Orders    None       , PA-C 11/23/20 0540    11/25/20, MD 11/23/20 [phone]

## 2020-11-23 NOTE — Progress Notes (Signed)
CSW contacted by [facility] RN regarding request for discharge assistance. CSW spoke with RN and noted from a physical standpoint patient would be appropriate for Taxi. CSW confirmed with group home owner a taxi would work for them and verified address. CSW provided RN taxi voucher to be called in when patient is in lobby awaiting discharge.

## 2020-11-23 NOTE — Progress Notes (Signed)
CSW received call from director of patient's group home that patient never arrived. CSW notes most recent information in chart states patient was moved to lobby  for DC (times documented 12:58p for DC and a 1:30p for transport according to Epic) . CSW checked with patient's RN and noted she had brought patient to the ED lobby and called [facility]. CSW checked lobby and noted no patient responded when calling out her name and nobody in the waiting room looked like patient. CSW checked outside ED to similar results. CSW checked in with lobby staff and noted they were unaware of the patient and not aware of anybody in the lobby awaiting a taxi.   CSW informed on-call TOC supervisor to be aware of the situation.   CSW contacted [facility] and left two voicemails and was unsuccessful. CSW spoke with [facility]  regarding and noted they reached out to a contact they had at [facility] Per [facility] records patient was picked up at 1:15pm and dropped off at the address (which group home confirmed). CSW is unclear what occurred after this as group home indicated they would be ready to take the patient and due to group home okaying taxi transport the taxi does not monitor whether patient went into the group home or not.   CSW updated TOC supervisor of updated information.  4:43p: CSW made 3x attempts to reach [facility] director to provide update. CSW notes phone number was not working although worked earlier in the day as they reached her at it. CSW will continue to try and reach [facility] owner.   5:05p: CSW reached director of group home and provided update. Per Director they had staff waiting with the door open but denied ever seeng the taxi. CSW reiterated from what they are aware the transportation request was completed and patient arrived at the group home address that was verified prior to scheduling the transportation.

## 2020-11-23 NOTE — [facility] Suicide Risk Assessment (Cosign Needed Addendum)
Suicide Risk Assessment  Discharge Assessment   [facility] Discharge Suicide Risk Assessment   Principal Problem: Substance induced mood disorder (HCC) Discharge Diagnoses: Principal Problem:   Substance induced mood disorder (HCC) Active Problems:   Marijuana dependence (HCC)   Bipolar disorder (HCC)   Dima Otelia Santee, 30 y.o., female who presented to [facility] ED for evaluation of suicidal ideations. Patient seen via telepsych by this provider; chart reviewed and consulted with Dr. Lucianne Muss on 11/23/20.    Per record review, on January 22nd, the patient was evaluated by the [facility] for suicidal behaviors in the context of being told by residential staff that she could not have her iphone overnight.  She was psych cleared and discharged home and recommended to follow-up with established outpatient mental health.  Within a few hours, the police IVCd the patient and brought her to [facility] ED for evaluation of suicidal ideations with plan to hang herself.  The patient has a medical history for IDD, chronic suicidal ideations, bipolar disorder.  Per record review she has a history for similar presentation as recent as 08/2020.    ?On assessment today she no longer endorses suicidal ieations plan to intent. States she never wanted to hurt herself but has a THC problem and feels anxious when she cannot have it.  States she started smoking marijuana since the age of 46, and continues to smoke "a $10 bag every time I go home for the weekend."  States last usage was sometime in December.  She is very guarded and states she does not want the residential staff to learn of this.  States she uses her electronics to help cope with cravings and felt suicidal when told by staff she could not keep her electronics 24-7. Otherwise she denies concerns with her current living arrangements.  She denies withdrawal symptoms.  We discussed starting her on gabapentin 200mg  BID to help with the cravings. She is agreeable and is  future oriented, states she would like to follow-up with her outpatient mental health provider as planned on Wednesday.  She is able to state other things she can do to help cope with she is upset, but acknowledges she needs to continue to work in employing these skills.   UDS is negative, BAL is negative, LFTs WNL, Covid is negative.   ? Per EDP Assessment Notes 11/22/2020: Lore Polka is a 30 y.o. female.  The history is provided by the patient and medical records.   30 year old female with history of ADHD, anxiety, bipolar disorder, cognitive delay, depression, diabetes, hyperlipidemia, hypothyroidism, seizure disorder, presenting to the ED under IVC petition by [employer].  Patient from group home, states she wants to kill herself.  She states "I have nothing to live for".  States all she does at her group home is attend her day program, eat, and then go to bed.  States she does not want to live this way and it is very boring.  She continues to endorse wanting to slit her wrist, has also thought about tying her bedsheets around her neck to strangle herself.  She has not attempted to harm herself.  She denies any homicidal ideation but states "if someone gets in my way I might hurt them".  She denies any alcohol or substance abuse.  States she has been compliant with her medications.  Total Time spent with patient: 30 minutes    Musculoskeletal: Strength & Muscle Tone: within normal limits Gait & Station: normal Patient leans: N/A  Psychiatric  Specialty Exam: @ROSBYAGE @  Blood pressure 116/73, pulse (!) 102, temperature 98.2 F (36.8 C), temperature source Oral, resp. rate 16, height 4\' 9"  (1.448 m), weight 98 kg, SpO2 98 %.Body mass index is 46.75 kg/m.  General Appearance: Casual and Guarded  Eye Contact:  Good  Speech:  Clear and Coherent and Normal Rate  Volume:  Normal  Mood:  mildly anxious  Affect:  Appropriate and Congruent  Thought Process:  Coherent, Goal Directed and  Descriptions of Associations: Intact  Orientation:  Full (Time, Place, and Person)  Thought Content:  Illogical on arrival related to SI but improved today  Suicidal Thoughts:  improved since admission  Homicidal Thoughts:  No  Memory:  Immediate;   Good Recent;   Good Remote;   Good  Judgement:  Fair, appears baseline in the setting of continued marijuana use  Insight:  Fair  Psychomotor Activity:  Normal  Concentration:  Good  Recall:  Good  Fund of Knowledge:Good  Language: Good  Akathisia:  Negative  Handed:  Right  AIMS (if indicated):     Assets:  Communication Skills Desire for Improvement Financial Resources/Insurance Housing Social Support  Sleep:     Cognition: WNL  ADL's:  Intact   Mental Status Per Nursing Assessment::   On Admission:     Demographic Factors:  IDD  Loss Factors: NA  Historical Factors: Impulsivity  Risk Reduction Factors:   Living with another person, especially a relative, Positive social support and Positive therapeutic relationship  Continued Clinical Symptoms:  Alcohol/Substance Abuse/Dependencies  Cognitive Features That Contribute To Risk:  Thought constriction (tunnel vision)    Suicide Risk:  Mild:  Suicidal ideation of limited frequency, intensity, duration, and specificity. There are no identifiable plans, no associated intent, mild dysphoria and related symptoms, good self-control (both objective and subjective assessment), few other risk factors, and identifiable protective factors, including available and accessible social support.  Disposition: No evidence of imminent risk to self or others at present.   Patient does not meet criteria for psychiatric inpatient admission. Supportive therapy provided about ongoing stressors. Discussed crisis plan, support from social network, calling 911, coming to the Emergency Department, and calling Suicide Hotline.  Plan Of Care/Follow-up recommendations:  Plan- As per above  assessment,  there are no current grounds for involuntary commitment at this time.? She has chronic SI usually related to limited coping skills. She lives in a residential setting with access to 24-hour staffing and supportive services, which buts her at a mild safety risk and she is appropriate for continued outpatient care.      Patient is not currently interested in inpatient services, but expresses agreement to continue outpatient treatment - we have reviewed importance of substance abuse abstinence, potential negative impact substance abuse can have on mental health, her relationships and level of functioning, and importance of medication compliance. ?  She should continue gabapentin 200mg  BID for withdrawal symptoms and anxiety. Reviewed with Dr.Dan , EDP,who agrees to write a 1 week med supply, until she sees outpatient psych.    I have asked Ava [phone], LCSW to provide outpatient substance abuse resources.      , NP 11/23/2020, 11:23 AM

## 2020-11-23 NOTE — Care Management (Signed)
Per NP, Ophelia Shoulder NP,patient does not meet criteria for inpatient hospitalization.   Writer spoke to the Director of the Group Home [phone]) and she reports that the hospital always provides transportation for the patient to be brought back to the facility.  Writer will coordinate to assist with transportation for the patient.

## 2020-12-10 ENCOUNTER — Ambulatory Visit (INDEPENDENT_AMBULATORY_CARE_PROVIDER_SITE_OTHER): Payer: Medicaid Other | Admitting: Podiatry

## 2020-12-10 ENCOUNTER — Encounter: Payer: Self-pay | Admitting: Podiatry

## 2020-12-10 ENCOUNTER — Other Ambulatory Visit: Payer: Self-pay

## 2020-12-10 DIAGNOSIS — B351 Tinea unguium: Secondary | ICD-10-CM | POA: Diagnosis not present

## 2020-12-10 DIAGNOSIS — M79674 Pain in right toe(s): Secondary | ICD-10-CM | POA: Diagnosis not present

## 2020-12-10 DIAGNOSIS — M79675 Pain in left toe(s): Secondary | ICD-10-CM

## 2020-12-10 NOTE — Progress Notes (Signed)
  Subjective:  Patient ID: Tami [name], female    DOB: 29-Jan-1991,  MRN: [id number]  Chief Complaint  Patient presents with  . routine foot care    Nail care    30 y.o. female returns for the above complaint.  Patient presents with thickened elongated dystrophic toenails x10.  Patient states that she is a diabetic she does not know her last A1c.  She would like to have the nails debrided down as she is not able to do it herself.  She denies any other acute complaints.  Objective:  There were no vitals filed for this visit. Podiatric Exam: Vascular: dorsalis pedis and posterior tibial pulses are palpable bilateral. Capillary return is immediate. Temperature gradient is WNL. Skin turgor WNL  Sensorium: Normal Semmes Weinstein monofilament test. Normal tactile sensation bilaterally. Nail Exam: Pt has thick disfigured discolored nails with subungual debris noted bilateral entire nail hallux through fifth toenails.  Pain on palpation to the nails. Ulcer Exam: There is no evidence of ulcer or pre-ulcerative changes or infection. Orthopedic Exam: Muscle tone and strength are WNL. No limitations in general ROM. No crepitus or effusions noted. HAV  B/L.  Hammer toes 2-5  B/L. Skin: No Porokeratosis. No infection or ulcers    Assessment & Plan:   1. Pain due to onychomycosis of toenails of both feet     Patient was evaluated and treated and all questions answered.  Onychomycosis with pain  -Nails palliatively debrided as below. -Educated on self-care  Procedure: Nail Debridement Rationale: pain  Type of Debridement: manual, sharp debridement. Instrumentation: Nail nipper, rotary burr. Number of Nails: 10  Procedures and Treatment: Consent by patient was obtained for treatment procedures. The patient understood the discussion of treatment and procedures well. All questions were answered thoroughly reviewed. Debridement of mycotic and hypertrophic toenails, 1 through 5 bilateral and  clearing of subungual debris. No ulceration, no infection noted.  Return Visit-Office Procedure: Patient instructed to return to the office for a follow up visit 3 months for continued evaluation and treatment.  Nicholes Rough, DPM    No follow-ups on file.

## 2021-03-11 ENCOUNTER — Ambulatory Visit (INDEPENDENT_AMBULATORY_CARE_PROVIDER_SITE_OTHER): Payer: Medicaid Other | Admitting: Podiatry

## 2021-03-11 ENCOUNTER — Other Ambulatory Visit: Payer: Self-pay

## 2021-03-11 DIAGNOSIS — L600 Ingrowing nail: Secondary | ICD-10-CM

## 2021-03-12 ENCOUNTER — Encounter: Payer: Self-pay | Admitting: Podiatry

## 2021-03-12 NOTE — Progress Notes (Signed)
Subjective:  Patient ID: Tami [name], female    DOB: [birth year],  MRN: [id number]  Chief Complaint  Patient presents with  . Ingrown Toenail    Left hallux     30 y.o. female presents with the above complaint.  Patient presents with complaint left hallux lateral border ingrown.  Patient states is painful to touch.  She would like to have it removed however she is scared of needles and does not know if she can tolerate it.  She would like to attempt it.  She states is painful to walk on has progressive gotten worse nothing is helped she is tried some self debridement which has not done much.  It is not clinically infected.  She has not been taking any antibiotics.   Review of Systems: Negative except as noted in the HPI. Denies N/V/F/Ch.  Past Medical History:  Diagnosis Date  . ADHD (attention deficit hyperactivity disorder)   . Anxiety   . Bipolar disorder (HCC)   . Chronic back pain   . Cognitive developmental delay   . Constipation   . Depression   . Diabetes mellitus without complication (HCC)    Type II - diabetes- 07/22/20- hasn't started on medicaion  . HLD (hyperlipidemia)   . Hypothyroidism   . Seizures (HCC)     Current Outpatient Medications:  .  AMITIZA 24 MCG capsule, Take 24 mcg by mouth 2 (two) times daily as needed for constipation., Disp: , Rfl:  .  atorvastatin (LIPITOR) 40 MG tablet, Take 40 mg by mouth at bedtime., Disp: , Rfl:  .  diclofenac Sodium (VOLTAREN) 1 % GEL, Apply 4 g topically 2 (two) times daily., Disp: , Rfl:  .  divalproex (DEPAKOTE) 500 MG DR tablet, Take 1 tablet (500 mg total) by mouth every 12 (twelve) hours., Disp: 60 tablet, Rfl: 0 .  Dulaglutide (TRULICITY) 0.75 MG/0.5ML SOPN, Inject 0.75 mg into the skin once a week., Disp: , Rfl:  .  FANAPT 4 MG TABS tablet, Take 4 mg by mouth 3 (three) times daily., Disp: , Rfl:  .  gabapentin (NEURONTIN) 100 MG capsule, Take 2 capsules (200 mg total) by mouth 2 (two) times daily., Disp: 120  capsule, Rfl: 0 .  hydrOXYzine (ATARAX/VISTARIL) 25 MG tablet, Take 1 tablet (25 mg total) by mouth 3 (three) times daily as needed for anxiety. (Patient taking differently: Take 25 mg by mouth 3 (three) times daily.), Disp: 30 tablet, Rfl: 0 .  ibuprofen (ADVIL) 800 MG tablet, Take 800 mg by mouth 3 (three) times daily as needed for mild pain or moderate pain. , Disp: , Rfl:  .  levothyroxine (SYNTHROID) 25 MCG tablet, Take 1 tablet (25 mcg total) by mouth daily at 6 (six) AM., Disp: 30 tablet, Rfl: 0 .  medroxyPROGESTERone (DEPO-PROVERA) 150 MG/ML injection, Inject 150 mg into the muscle every 3 (three) months. , Disp: , Rfl:  .  medroxyPROGESTERone Acetate 150 MG/ML SUSY, Inject into the muscle every 21 ( twenty-one) days., Disp: , Rfl:  .  meloxicam (MOBIC) 15 MG tablet, Take 15 mg by mouth daily., Disp: , Rfl:  .  Multiple Vitamin (MULTIVITAMIN WITH MINERALS) TABS tablet, Take 1 tablet by mouth daily., Disp: , Rfl:  .  naproxen (NAPROSYN) 500 MG tablet, Take 500 mg by mouth 2 (two) times daily with a meal., Disp: , Rfl:  .  Semaglutide (RYBELSUS) 3 MG TABS, Take 3 mg by mouth daily before breakfast., Disp: , Rfl:  .  traZODone (DESYREL) 100  MG tablet, Take 200 mg by mouth at bedtime as needed for sleep., Disp: , Rfl:   Social History   Tobacco Use  Smoking Status Former Smoker  . Packs/day: 0.25  . Years: 4.00  . Pack years: 1.00  . Types: Cigarettes  . Quit date: 2019  . Years since quitting: 3.3  Smokeless Tobacco Never Used  Tobacco Comment   " may smoke when she goes home."    Allergies  Allergen Reactions  . Diphenhydramine     Itch  Other reaction(s): Unknown  . Penicillins     Hives    Objective:  There were no vitals filed for this visit. There is no height or weight on file to calculate BMI. Constitutional Well developed. Well nourished.  Vascular Dorsalis pedis pulses palpable bilaterally. Posterior tibial pulses palpable bilaterally. Capillary refill normal  to all digits.  No cyanosis or clubbing noted. Pedal hair growth normal.  Neurologic Normal speech. Oriented to person, place, and time. Epicritic sensation to light touch grossly present bilaterally.  Dermatologic Painful ingrowing nail at lateral nail borders of the hallux nail left. No other open wounds. No skin lesions.  Orthopedic: Normal joint ROM without pain or crepitus bilaterally. No visible deformities. No bony tenderness.   Radiographs: None Assessment:   1. Ingrown left big toenail    Plan:  Patient was evaluated and treated and all questions answered.  Ingrown Nail, left -Clinically the ingrown nail is not infected and does not appear to be too involved in terms of the cause of her pain however it is definitely ingrown and it seems to be causing her pain but patient would like to hold off on doing a procedure as she is not able to tolerate the injection and she is afraid of the needle.  I discussed with her that I can do it in the operating room or for now we can hold off.  I encouraged her not to do self debridement as it can lead to infection.  She states understanding and will come back and see me when she is ready. No follow-ups on file.

## 2021-06-17 ENCOUNTER — Ambulatory Visit (INDEPENDENT_AMBULATORY_CARE_PROVIDER_SITE_OTHER): Payer: Medicaid Other | Admitting: Podiatry

## 2021-06-17 ENCOUNTER — Other Ambulatory Visit: Payer: Self-pay

## 2021-06-17 DIAGNOSIS — L853 Xerosis cutis: Secondary | ICD-10-CM

## 2021-06-17 DIAGNOSIS — B351 Tinea unguium: Secondary | ICD-10-CM | POA: Diagnosis not present

## 2021-06-17 MED ORDER — CICLOPIROX 8 % EX SOLN: Freq: Every day | CUTANEOUS | 0 refills | Status: DC

## 2021-06-17 NOTE — Progress Notes (Signed)
pen

## 2021-06-18 NOTE — Progress Notes (Signed)
Subjective:  Patient ID: Tami [name], female    DOB: Oct 16, 1991,  MRN: [id number]  Chief Complaint  Patient presents with   Nail Problem    Nail trim     30 y.o. female presents with the above complaint.  Patient presents with primary complaint of bilateral hallux thickened elongated dystrophic toenail.  Patient states there is fungus associated with this she would like to discuss treatment options for it.  She does not want take any oral medication she wants to do topical medication she also has secondary complaint of dry skin.  She did she has tried multiple over-the-counter lotions none of which has helped.  She would like to discuss prescription lotion for this.   Review of Systems: Negative except as noted in the HPI. Denies N/V/F/Ch.  Past Medical History:  Diagnosis Date   ADHD (attention deficit hyperactivity disorder)    Anxiety    Bipolar disorder (HCC)    Chronic back pain    Cognitive developmental delay    Constipation    Depression    Diabetes mellitus without complication (HCC)    Type II - diabetes- 07/22/20- hasn't started on medicaion   HLD (hyperlipidemia)    Hypothyroidism    Seizures (HCC)     Current Outpatient Medications:    ciclopirox (PENLAC) 8 % solution, Apply topically at bedtime. Apply over nail and surrounding skin. Apply daily over previous coat. After seven (7) days, may remove with alcohol and continue cycle., Disp: 6.6 mL, Rfl: 0   AMITIZA 24 MCG capsule, Take 24 mcg by mouth 2 (two) times daily as needed for constipation., Disp: , Rfl:    atorvastatin (LIPITOR) 40 MG tablet, Take 40 mg by mouth at bedtime., Disp: , Rfl:    diclofenac Sodium (VOLTAREN) 1 % GEL, Apply 4 g topically 2 (two) times daily., Disp: , Rfl:    divalproex (DEPAKOTE) 500 MG DR tablet, Take 1 tablet (500 mg total) by mouth every 12 (twelve) hours., Disp: 60 tablet, Rfl: 0   Dulaglutide (TRULICITY) 0.75 MG/0.5ML SOPN, Inject 0.75 mg into the skin once a week., Disp: ,  Rfl:    FANAPT 4 MG TABS tablet, Take 4 mg by mouth 3 (three) times daily., Disp: , Rfl:    gabapentin (NEURONTIN) 100 MG capsule, Take 2 capsules (200 mg total) by mouth 2 (two) times daily., Disp: 120 capsule, Rfl: 0   hydrOXYzine (ATARAX/VISTARIL) 25 MG tablet, Take 1 tablet (25 mg total) by mouth 3 (three) times daily as needed for anxiety. (Patient taking differently: Take 25 mg by mouth 3 (three) times daily.), Disp: 30 tablet, Rfl: 0   ibuprofen (ADVIL) 800 MG tablet, Take 800 mg by mouth 3 (three) times daily as needed for mild pain or moderate pain. , Disp: , Rfl:    levothyroxine (SYNTHROID) 25 MCG tablet, Take 1 tablet (25 mcg total) by mouth daily at 6 (six) AM., Disp: 30 tablet, Rfl: 0   medroxyPROGESTERone (DEPO-PROVERA) 150 MG/ML injection, Inject 150 mg into the muscle every 3 (three) months. , Disp: , Rfl:    medroxyPROGESTERone Acetate 150 MG/ML SUSY, Inject into the muscle every 21 ( twenty-one) days., Disp: , Rfl:    meloxicam (MOBIC) 15 MG tablet, Take 15 mg by mouth daily., Disp: , Rfl:    Multiple Vitamin (MULTIVITAMIN WITH MINERALS) TABS tablet, Take 1 tablet by mouth daily., Disp: , Rfl:    naproxen (NAPROSYN) 500 MG tablet, Take 500 mg by mouth 2 (two) times daily with a  meal., Disp: , Rfl:    Semaglutide (RYBELSUS) 3 MG TABS, Take 3 mg by mouth daily before breakfast., Disp: , Rfl:    traZODone (DESYREL) 100 MG tablet, Take 200 mg by mouth at bedtime as needed for sleep., Disp: , Rfl:   Social History   Tobacco Use  Smoking Status Former   Packs/day: 0.25   Years: 4.00   Pack years: 1.00   Types: Cigarettes   Quit date: 2019   Years since quitting: 3.6  Smokeless Tobacco Never  Tobacco Comments   " may smoke when she goes home."    Allergies  Allergen Reactions   Diphenhydramine     Itch  Other reaction(s): Unknown   Penicillins     Hives    Objective:  There were no vitals filed for this visit. There is no height or weight on file to calculate  BMI. Constitutional Well developed. Well nourished.  Vascular Dorsalis pedis pulses palpable bilaterally. Posterior tibial pulses palpable bilaterally. Capillary refill normal to all digits.  No cyanosis or clubbing noted. Pedal hair growth normal.  Neurologic Normal speech. Oriented to person, place, and time. Epicritic sensation to light touch grossly present bilaterally.  Dermatologic Nails thickened elongated dystrophic toenails to bilateral hallux and mild mycosis to the rest of the digits.  Mild pain on palpation Skin dry skin without subjective component of itching without fissuring noted to both plantar foot  Orthopedic: Normal joint ROM without pain or crepitus bilaterally. No visible deformities. No bony tenderness.   Radiographs: None Assessment:   1. Nail fungus   2. Onychomycosis due to dermatophyte   3. Xerosis of skin    Plan:  Patient was evaluated and treated and all questions answered.  Xerosis skin bilateral plantar foot -I explained to the patient the etiology of xerosis and various treatment options were extensively discussed.  I explained to the patient the importance of maintaining moisturization of the skin with application of over-the-counter lotion such as Eucerin or Luciderm.  Given that she has failed over-the-counter lotion therapy I believe she will benefit from prescription ammonium lactate.  I sent her to the pharmacy I will asked her to apply twice a day.  She states understanding  Bilateral hallux onychomycosis -Educated the patient on the etiology of onychomycosis and various treatment options associated with improving the fungal load.  I explained to the patient that there is 3 treatment options available to treat the onychomycosis including topical, p.o., laser treatment.  Patient has elected to undergo topical medication.  I will send Penlac to the pharmacy of asked her to apply twice a day for 6 to 8 months until resolved.  She states  understanding    No follow-ups on file.

## 2021-07-07 ENCOUNTER — Other Ambulatory Visit: Payer: Self-pay | Admitting: Podiatry

## 2021-08-21 ENCOUNTER — Other Ambulatory Visit: Payer: Self-pay | Admitting: Podiatry

## 2021-08-24 NOTE — Telephone Encounter (Signed)
Please advise 

## 2021-09-02 ENCOUNTER — Telehealth: Payer: Self-pay | Admitting: Podiatry

## 2021-09-02 NOTE — Telephone Encounter (Signed)
Patients group home called and stated that the Penlac can be discontinued because she is not longer using it anymore.

## 2021-09-10 ENCOUNTER — Telehealth: Payer: Self-pay | Admitting: *Deleted

## 2021-09-10 NOTE — Telephone Encounter (Signed)
[facility] is wanting to know if a new prescription(ciclopirox-8% solution)will be given for this patient at upcoming visit(09/23/21). If not, please send a d/c note or letter to their facility after visit. Please advise.

## 2021-09-23 ENCOUNTER — Ambulatory Visit: Payer: Medicaid Other | Admitting: Podiatry

## 2021-10-07 ENCOUNTER — Ambulatory Visit: Payer: Medicaid Other | Admitting: Podiatry

## 2021-10-21 ENCOUNTER — Other Ambulatory Visit: Payer: Self-pay

## 2021-10-21 ENCOUNTER — Ambulatory Visit (INDEPENDENT_AMBULATORY_CARE_PROVIDER_SITE_OTHER): Payer: Medicaid Other | Admitting: Podiatry

## 2021-10-21 DIAGNOSIS — L853 Xerosis cutis: Secondary | ICD-10-CM

## 2021-10-21 DIAGNOSIS — B351 Tinea unguium: Secondary | ICD-10-CM

## 2021-10-21 NOTE — Progress Notes (Signed)
Subjective:  Patient ID: Tami [name], female    DOB: 09-16-1991,  MRN: [id number]  Chief Complaint  Patient presents with   Nail Problem    Nail fungus follow up     30 y.o. female presents with the above complaint.  Patient presents follow-up from dry skin as well as bilateral nail fungus.  She states the over-the-counter medication has not helped.  She would like to discuss oral medication.  She states it seems to be getting worse.  She denies any other acute complaints.   Review of Systems: Negative except as noted in the HPI. Denies N/V/F/Ch.  Past Medical History:  Diagnosis Date   ADHD (attention deficit hyperactivity disorder)    Anxiety    Bipolar disorder (HCC)    Chronic back pain    Cognitive developmental delay    Constipation    Depression    Diabetes mellitus without complication (HCC)    Type II - diabetes- 07/22/20- hasn't started on medicaion   HLD (hyperlipidemia)    Hypothyroidism    Seizures (HCC)     Current Outpatient Medications:    AMITIZA 24 MCG capsule, Take 24 mcg by mouth 2 (two) times daily as needed for constipation., Disp: , Rfl:    atorvastatin (LIPITOR) 40 MG tablet, Take 40 mg by mouth at bedtime., Disp: , Rfl:    ciclopirox (PENLAC) 8 % solution, APPLY TOPICALLY AT BEDTIME, APPLY OVER NAIL & SURROUNDING SKIN, APPLY DAILY OVER PREVIOUS COAT, AFTER 7 DAYS MAY REMOVE WITH ALCOHOL AND CONTINUE CYCLE., Disp: 6.6 mL, Rfl: 0   diclofenac Sodium (VOLTAREN) 1 % GEL, Apply 4 g topically 2 (two) times daily., Disp: , Rfl:    divalproex (DEPAKOTE) 500 MG DR tablet, Take 1 tablet (500 mg total) by mouth every 12 (twelve) hours., Disp: 60 tablet, Rfl: 0   Dulaglutide (TRULICITY) 0.75 MG/0.5ML SOPN, Inject 0.75 mg into the skin once a week., Disp: , Rfl:    FANAPT 4 MG TABS tablet, Take 4 mg by mouth 3 (three) times daily., Disp: , Rfl:    gabapentin (NEURONTIN) 100 MG capsule, Take 2 capsules (200 mg total) by mouth 2 (two) times daily., Disp: 120  capsule, Rfl: 0   hydrOXYzine (ATARAX/VISTARIL) 25 MG tablet, Take 1 tablet (25 mg total) by mouth 3 (three) times daily as needed for anxiety. (Patient taking differently: Take 25 mg by mouth 3 (three) times daily.), Disp: 30 tablet, Rfl: 0   ibuprofen (ADVIL) 800 MG tablet, Take 800 mg by mouth 3 (three) times daily as needed for mild pain or moderate pain. , Disp: , Rfl:    levothyroxine (SYNTHROID) 25 MCG tablet, Take 1 tablet (25 mcg total) by mouth daily at 6 (six) AM., Disp: 30 tablet, Rfl: 0   medroxyPROGESTERone (DEPO-PROVERA) 150 MG/ML injection, Inject 150 mg into the muscle every 3 (three) months. , Disp: , Rfl:    medroxyPROGESTERone Acetate 150 MG/ML SUSY, Inject into the muscle every 21 ( twenty-one) days., Disp: , Rfl:    meloxicam (MOBIC) 15 MG tablet, Take 15 mg by mouth daily., Disp: , Rfl:    Multiple Vitamin (MULTIVITAMIN WITH MINERALS) TABS tablet, Take 1 tablet by mouth daily., Disp: , Rfl:    naproxen (NAPROSYN) 500 MG tablet, Take 500 mg by mouth 2 (two) times daily with a meal., Disp: , Rfl:    Semaglutide (RYBELSUS) 3 MG TABS, Take 3 mg by mouth daily before breakfast., Disp: , Rfl:    traZODone (DESYREL) 100 MG tablet,  Take 200 mg by mouth at bedtime as needed for sleep., Disp: , Rfl:   Social History   Tobacco Use  Smoking Status Former   Packs/day: 0.25   Years: 4.00   Pack years: 1.00   Types: Cigarettes   Quit date: 2019   Years since quitting: 3.9  Smokeless Tobacco Never  Tobacco Comments   " may smoke when she goes home."    Allergies  Allergen Reactions   Diphenhydramine     Itch  Other reaction(s): Unknown   Penicillins     Hives    Objective:  There were no vitals filed for this visit. There is no height or weight on file to calculate BMI. Constitutional Well developed. Well nourished.  Vascular Dorsalis pedis pulses palpable bilaterally. Posterior tibial pulses palpable bilaterally. Capillary refill normal to all digits.  No cyanosis  or clubbing noted. Pedal hair growth normal.  Neurologic Normal speech. Oriented to person, place, and time. Epicritic sensation to light touch grossly present bilaterally.  Dermatologic Nails thickened elongated dystrophic toenails to bilateral hallux and mild mycosis to the rest of the digits.  Mild pain on palpation No further skin dry skin without subjective component of itching without fissuring noted to both plantar foot  Orthopedic: Normal joint ROM without pain or crepitus bilaterally. No visible deformities. No bony tenderness.   Radiographs: None Assessment:   1. Onychomycosis due to dermatophyte   2. Nail fungus   3. Xerosis of skin    Plan:  Patient was evaluated and treated and all questions answered.  Xerosis skin bilateral plantar foot -Improving with an ammonium lactate  Bilateral hallux onychomycosis -Educated the patient on the etiology of onychomycosis and various treatment options associated with improving the fungal load.  I explained to the patient that there is 3 treatment options available to treat the onychomycosis including topical, p.o., laser treatment.  Patient elected to undergo p.o. options with Lamisil/terbinafine therapy.  In order for me to start the medication therapy, I explained to the patient the importance of evaluating the liver and obtaining the liver function test.  Once the liver function test comes back normal I will start him on 20-month course of Lamisil therapy.  Patient understood all risk and would like to proceed with Lamisil therapy.  I have asked the patient to immediately stop the Lamisil therapy if she has any reactions to it and call the office or go to the emergency room right away.  Patient states understanding      No follow-ups on file.

## 2022-02-10 ENCOUNTER — Ambulatory Visit (INDEPENDENT_AMBULATORY_CARE_PROVIDER_SITE_OTHER): Payer: Medicaid Other | Admitting: Podiatry

## 2022-02-10 ENCOUNTER — Encounter: Payer: Self-pay | Admitting: Podiatry

## 2022-02-10 DIAGNOSIS — M79675 Pain in left toe(s): Secondary | ICD-10-CM | POA: Diagnosis not present

## 2022-02-10 DIAGNOSIS — M79674 Pain in right toe(s): Secondary | ICD-10-CM | POA: Diagnosis not present

## 2022-02-10 DIAGNOSIS — B351 Tinea unguium: Secondary | ICD-10-CM

## 2022-02-10 NOTE — Progress Notes (Signed)
?  Subjective:  ?Patient ID: Tami [name], female    DOB: [birth year],  MRN: [id number] ? ?Chief Complaint  ?Patient presents with  ? Nail Problem  ?  Nail trim   ? ?31 y.o. female returns for the above complaint.  Patient presents with thickened elongated dystrophic toenails x10.  Patient states that she is a diabetic she does not know her last A1c.  She would like to have the nails debrided down as she is not able to do it herself.  She denies any other acute complaints. ? ?Objective:  ?There were no vitals filed for this visit. ?Podiatric Exam: ?Vascular: dorsalis pedis and posterior tibial pulses are palpable bilateral. Capillary return is immediate. Temperature gradient is WNL. Skin turgor WNL  ?Sensorium: Normal Semmes Weinstein monofilament test. Normal tactile sensation bilaterally. ?Nail Exam: Pt has thick disfigured discolored nails with subungual debris noted bilateral entire nail hallux through fifth toenails.  Pain on palpation to the nails. ?Ulcer Exam: There is no evidence of ulcer or pre-ulcerative changes or infection. ?Orthopedic Exam: Muscle tone and strength are WNL. No limitations in general ROM. No crepitus or effusions noted. HAV  B/L.  Hammer toes 2-5  B/L. ?Skin: No Porokeratosis. No infection or ulcers ? ? ? ?Assessment & Plan:  ? ?1. Pain due to onychomycosis of toenails of both feet   ? ? ? ?Patient was evaluated and treated and all questions answered. ? ?Onychomycosis with pain  ?-Nails palliatively debrided as below. ?-Educated on self-care ? ?Procedure: Nail Debridement ?Rationale: pain  ?Type of Debridement: manual, sharp debridement. ?Instrumentation: Nail nipper, rotary burr. ?Number of Nails: 10 ? ?Procedures and Treatment: Consent by patient was obtained for treatment procedures. The patient understood the discussion of treatment and procedures well. All questions were answered thoroughly reviewed. Debridement of mycotic and hypertrophic toenails, 1 through 5 bilateral and  clearing of subungual debris. No ulceration, no infection noted.  ?Return Visit-Office Procedure: Patient instructed to return to the office for a follow up visit 3 months for continued evaluation and treatment. ? ?Boneta Lucks, DPM ?  ? ?No follow-ups on file. ? ?

## 2022-05-12 ENCOUNTER — Encounter: Payer: Self-pay | Admitting: Podiatry

## 2022-05-12 ENCOUNTER — Ambulatory Visit (INDEPENDENT_AMBULATORY_CARE_PROVIDER_SITE_OTHER): Payer: Medicaid Other | Admitting: Podiatry

## 2022-05-12 DIAGNOSIS — B351 Tinea unguium: Secondary | ICD-10-CM

## 2022-05-12 DIAGNOSIS — M79675 Pain in left toe(s): Secondary | ICD-10-CM

## 2022-05-12 DIAGNOSIS — M79674 Pain in right toe(s): Secondary | ICD-10-CM | POA: Diagnosis not present

## 2022-05-12 NOTE — Progress Notes (Signed)
  Subjective:  Patient ID: Tami [name], female    DOB: 11/06/1990,  MRN: [id number]  Chief Complaint  Patient presents with   Nail Problem    Nail trim    31 y.o. female returns for the above complaint.  Patient presents with thickened elongated dystrophic toenails x10.  Patient states that she is a diabetic she does not know her last A1c.  She would like to have the nails debrided down as she is not able to do it herself.  She denies any other acute complaints.  Objective:  There were no vitals filed for this visit. Podiatric Exam: Vascular: dorsalis pedis and posterior tibial pulses are palpable bilateral. Capillary return is immediate. Temperature gradient is WNL. Skin turgor WNL  Sensorium: Normal Semmes Weinstein monofilament test. Normal tactile sensation bilaterally. Nail Exam: Pt has thick disfigured discolored nails with subungual debris noted bilateral entire nail hallux through fifth toenails.  Pain on palpation to the nails. Ulcer Exam: There is no evidence of ulcer or pre-ulcerative changes or infection. Orthopedic Exam: Muscle tone and strength are WNL. No limitations in general ROM. No crepitus or effusions noted. HAV  B/L.  Hammer toes 2-5  B/L. Skin: No Porokeratosis. No infection or ulcers    Assessment & Plan:   1. Pain due to onychomycosis of toenails of both feet       Patient was evaluated and treated and all questions answered.  Onychomycosis with pain  -Nails palliatively debrided as below. -Educated on self-care  Procedure: Nail Debridement Rationale: pain  Type of Debridement: manual, sharp debridement. Instrumentation: Nail nipper, rotary burr. Number of Nails: 10  Procedures and Treatment: Consent by patient was obtained for treatment procedures. The patient understood the discussion of treatment and procedures well. All questions were answered thoroughly reviewed. Debridement of mycotic and hypertrophic toenails, 1 through 5 bilateral and  clearing of subungual debris. No ulceration, no infection noted.  Return Visit-Office Procedure: Patient instructed to return to the office for a follow up visit 3 months for continued evaluation and treatment.  Nicholes Rough, DPM    Return in about 3 months (around 08/12/2022) for [place] .

## 2022-08-17 ENCOUNTER — Ambulatory Visit (INDEPENDENT_AMBULATORY_CARE_PROVIDER_SITE_OTHER): Payer: Medicaid Other | Admitting: Podiatry

## 2022-08-17 DIAGNOSIS — E119 Type 2 diabetes mellitus without complications: Secondary | ICD-10-CM

## 2022-08-17 DIAGNOSIS — B351 Tinea unguium: Secondary | ICD-10-CM | POA: Diagnosis not present

## 2022-08-17 DIAGNOSIS — M79675 Pain in left toe(s): Secondary | ICD-10-CM

## 2022-08-17 DIAGNOSIS — M79674 Pain in right toe(s): Secondary | ICD-10-CM

## 2022-08-17 NOTE — Progress Notes (Signed)
  Subjective:  Patient ID: Tami [name], female    DOB: December 23, 1990,  MRN: [id number]  [place] presents to clinic today for:  Chief Complaint  Patient presents with   Nail Problem    Diabetic foot care BS-Only check on Friday's  A1C- do not know PCP-Imran Haque PCP VST-07/09/2022   New problem(s): None.   PCP is [place], Rosalyn Charters, MD.  Allergies  Allergen Reactions   Codeine     Other reaction(s): Other (See Comments) Told as a child   Diphenhydramine     Itch  Other reaction(s): Unknown   Penicillins     Hives     Review of Systems: Negative except as noted in the HPI.  Objective: No changes noted in today's physical examination.  Tami [name] is a pleasant 31 y.o. female obese in NAD. AAO x 3.  Vascular Examination: Capillary refill time immediate b/l.Vascular status intact b/l with palpable pedal pulses. Pedal hair present b/l. No edema. No pain with calf compression b/l. Skin temperature gradient WNL b/l.   Neurological Examination: Sensation grossly intact b/l with 10 gram monofilament. Vibratory sensation intact b/l.   Dermatological Examination: Pedal skin with normal turgor, texture and tone b/l. Toenails 1-5 b/l thick, discolored, elongated with subungual debris and pain on dorsal palpation.   Musculoskeletal Examination: HAV with bunion bilaterally and hammertoes 2-5 b/l.  Radiographs: None  Assessment/Plan: 1. Pain due to onychomycosis of toenails of both feet   2. Controlled type 2 diabetes mellitus without complication, without long-term current use of insulin ([place])     No orders of the defined types were placed in this encounter.   -Facility staff present with patient.  All questions/concerns addressed on today's visit. -Continue foot and shoe inspections daily. Monitor blood glucose per PCP/Endocrinologist's recommendations. -Toenails 1-5 b/l were debrided in length and girth with sterile nail nippers and dremel without  iatrogenic bleeding.  -Patient/POA to call should there be question/concern in the interim.   Return in about 3 months (around 11/17/2022).  Marzetta Board, DPM

## 2022-08-20 ENCOUNTER — Encounter: Payer: Self-pay | Admitting: Podiatry

## 2022-09-16 ENCOUNTER — Emergency Department (HOSPITAL_COMMUNITY)
Admission: EM | Admit: 2022-09-16 | Discharge: 2022-09-16 | Discharge status: Against Medical Advice | Payer: Medicaid Other | Attending: Emergency Medicine | Admitting: Emergency Medicine

## 2022-09-16 ENCOUNTER — Other Ambulatory Visit: Payer: Self-pay

## 2022-09-16 DIAGNOSIS — Z5321 Procedure and treatment not carried out due to patient leaving prior to being seen by health care provider: Secondary | ICD-10-CM | POA: Insufficient documentation

## 2022-09-16 DIAGNOSIS — N939 Abnormal uterine and vaginal bleeding, unspecified: Secondary | ICD-10-CM | POA: Diagnosis present

## 2022-09-16 LAB — I-STAT BETA HCG BLOOD, ED (MC, WL, AP ONLY): I-stat hCG, quantitative: <5 m[IU]/mL (ref <5)

## 2022-09-16 NOTE — ED Triage Notes (Signed)
EMS stated, lower abdominal pain started last night. Recently had onset lower abdominal pain. Recently came off of her birth control pills and had some unprotected sex and is afraid she might be pregnant.

## 2022-09-16 NOTE — ED Notes (Signed)
Pt accompanied by guardian. Guardian states that they are leaving and will follow up with PCP about their results.

## 2022-09-16 NOTE — ED Provider Triage Note (Signed)
Emergency Medicine Provider Triage Evaluation Note  Tami [name] , a 31 y.o. female  was evaluated in triage.  Pt complains of pregnancy.  Patient normally gets Depo shot but has not had it in roughly a month.  She is not been on any other forms of contraceptive therapy.  She did have unprotected sex during that time.  She is now having a abdominal cramping and some vaginal spotting which started today.  Denies any other symptoms.  Review of Systems  Positive:  Negative: See above   Physical Exam  BP 127/76 (BP Location: Left Arm)   Pulse 90   Temp 98 F (36.7 C)   Resp 17   Ht 4\' 9"  (1.448 m)   Wt 98 kg   SpO2 97%   BMI 46.74 kg/m  Gen:   Awake, no distress   Resp:  Normal effort  MSK:   Moves extremities without difficulty  Other:  No abdominal tenderness   Medical Decision Making  Medically screening exam initiated at 12:27 PM.  Appropriate orders placed.  Arlina Cain was informed that the remainder of the evaluation will be completed by another provider, this initial triage assessment does not replace that evaluation, and the importance of remaining in the ED until their evaluation is complete.  We will start with an i-STAT beta-hCG.  If positive, will plan to do ultrasound.   Dewayne Hatch [place], [place] 09/16/22 1229

## 2022-09-17 ENCOUNTER — Telehealth (HOSPITAL_COMMUNITY): Payer: Self-pay

## 2022-11-02 ENCOUNTER — Ambulatory Visit (HOSPITAL_COMMUNITY)
Admission: EM | Admit: 2022-11-02 | Discharge: 2022-11-02 | Disposition: A | Discharge status: Home / Self Care | Payer: Medicaid Other | Attending: Psychiatry | Admitting: Psychiatry

## 2022-11-02 DIAGNOSIS — R45851 Suicidal ideations: Secondary | ICD-10-CM | POA: Insufficient documentation

## 2022-11-02 DIAGNOSIS — F332 Major depressive disorder, recurrent severe without psychotic features: Secondary | ICD-10-CM

## 2022-11-02 DIAGNOSIS — F909 Attention-deficit hyperactivity disorder, unspecified type: Secondary | ICD-10-CM | POA: Insufficient documentation

## 2022-11-02 DIAGNOSIS — Z634 Disappearance and death of family member: Secondary | ICD-10-CM | POA: Insufficient documentation

## 2022-11-02 DIAGNOSIS — F419 Anxiety disorder, unspecified: Secondary | ICD-10-CM | POA: Insufficient documentation

## 2022-11-02 DIAGNOSIS — F88 Other disorders of psychological development: Secondary | ICD-10-CM | POA: Insufficient documentation

## 2022-11-02 DIAGNOSIS — F319 Bipolar disorder, unspecified: Secondary | ICD-10-CM | POA: Insufficient documentation

## 2022-11-02 NOTE — Discharge Instructions (Signed)

## 2022-11-02 NOTE — Progress Notes (Signed)
   11/02/22 1504  [place] (Walk-ins at [facility] only)  How Did You Hear About [place]? Other (Comment) (Day Program called the police due to threats to harm self)  What Is the Reason for Your Visit/Call Today? Pt is a 32 yo female who presented voluntarily and unaccompanied due to statements she made at her day program that she wanted to stab herself with a fork or knife. Her firned at the day program told the staff and the staff called the police. Pt endorsed SI with a plan to stab herself with a knife or fork. Pt stated that she had been upset since last Friday whe she called her father and her told her he did not want anything to do with her. She was calling him to come get her for a home visit since her grandmother could not. Pt stated she was also upset because her boyfriend of 3 years recently started a relationship with someone else and sent her a picture of the two of them accidentally. Pt denied HI, current NSSH, AVH, paranoia and any substance use. Pt stated that she sees a psychiatrist for medication management and has been seeing an OP therapist but is going to be assigned to another one soon with the same agency. Hx of cutting and burning herself. Last SH episode was about 4 years ago per pt.  How Long Has This Been Causing You Problems? <Week  Have You Recently Had Any Thoughts About Hurting Yourself? Yes  How long ago did you have thoughts about hurting yourself? earlier today  Are You Planning to [place] At This time? Yes  Have you Recently Had Thoughts About Hurting Someone Guadalupe Dawn? No  Are You Planning To Harm Someone At This Time? No  Are you currently experiencing any auditory, visual or other hallucinations? No  Have You Used Any Alcohol or Drugs in the Past 24 Hours? No  Do you have any current medical co-morbidities that require immediate attention? Yes  Please describe current medical co-morbidities that require immediate attention: Diabetes  Clinician  description of patient physical appearance/behavior: Pt was calm, cooperative, polite, alert and seemed fully oriented. Pt seemed to have a slight speech impediment and moved slowly. Pt's judgment and insight seemed impaired. Pt's mood seemed depressed and her flat affect was congruent.  What Do You Feel Would Help You the Most Today? Treatment for Depression or other mood problem  If access to [facility] was not available, would you have sought care in the Emergency Department? Yes  Determination of Need Urgent (48 hours)  Options For Referral [facility]   Peachie Barkalow T. Mare Ferrari, [place], [facility], [facility] Triage Specialist [facility]

## 2022-11-02 NOTE — ED Provider Notes (Signed)
Behavioral Health Urgent Care Medical Screening Exam  Patient Name: Tami [name] MRN: [id number] Date of Evaluation: 11/02/22 Chief Complaint:   Diagnosis:  Final diagnoses:  Severe episode of recurrent major depressive disorder, without psychotic features ([place])    History of Present illness: Tami [name] is a 32 y.o. female. Patient presents voluntarily to [facility] behavioral health for walk-in assessment.  Patient is transported by Event organiser from day program. Patient is assessed, face-to-face, by nurse practitioner. She is  seated in assessment area, no acute distress. Consulted with provider, Dr.  Dwyane Dee, and chart reviewed on 11/02/2022. She  is alert and oriented, pleasant and cooperative during assessment.   Patient  presents with depressed mood, congruent affect. She endorses ongoing suicidal ideations for "a long while now." She denies plan or intent to complete suicide at this time. She denies  homicidal ideations.   Patient contracts verbally for safety with this Probation officer.She states "I am feeling better now."  Tami [name] reports recent stressors include a break-up with her boyfriend several weeks ago.  2 days ago ex-boyfriend sent a picture of himself with another girlfriend.  This caused patient to be very upset and have an emotional outburst while at her job.  Chronic stressors include the loss of her mother.  Tami [name] reports her favorite staff member at the group home is "Ms. Key." Favorite group home staff member is off of the holidays.  Patient reports is a difficult time because staff member is "like a mom to me since my mom passed away." Tami [name] is looking forward to Ms Georgann Housekeeper return after time off for holidays.   Tami [name]'s diagnoses include bipolar affective disorder, major depressive disorder, ADHD, anxiety bipolar disorder and cognitive developmental delay.  She is stable on the combination of Depakote and hydroxyzine.  She is compliant with medications,  medications are administered by staff 3 times daily.  Medication management at [facility].  She is seen by counseling through her day program.  She is meeting with a new therapist, that she has not yet met, moving forward.  She endorses history of previous inpatient psychiatric hospitalizations.  Per medical record patient admitted in 2020 and 2014 to [facility] behavioral health.  No family mental health history reported.    Patient has normal speech and behavior.  She  denies auditory and visual hallucinations.  Patient is able to converse coherently with goal-directed thoughts and no distractibility or preoccupation.  Denies symptoms of paranoia.  Objectively there is no evidence of psychosis/mania or delusional thinking.  Tami [name] resides at a group home.  She denies access to weapons.  She is employed in the Production designer, theatre/television/film.  She also attends a day program, [facility].  Patient endorses average sleep and appetite.  She denies alcohol and substance use.  Patient offered support and encouragement.  She gives verbal consent to speak with her legal guardian as well as her group home staff.  Spoke with patient's legal guardian, Heloise Beecham, [phone].  Follow-up agrees with plan for discharge and follow-up with established outpatient psychiatry.  Spoke with group home owner, Evern Core phone number [phone].  Generated and reviewed safety plan to include patient with monitored phone calls as patient is often triggered by phone calls from boyfriend who is not prone patient's contact list.  Discussed methods to reduce the risk of self-injury or suicide attempts: Frequent conversations regarding unsafe thoughts. Remove all significant sharps. Remove all firearms. Remove all medications, including over-the-counter medications. Consider lockbox for medications and  having a responsible person dispense medications until patient has strengthened coping skills. Room checks for  sharps or other harmful objects. Secure all chemical substances that can be ingested or inhaled.  Additionally group home staff will monitor patient and review patient's room for any safety concerns.  Patient's are not permitted to retire to the room without staff permission at patient's group home.   Patient and caregivers are educated and verbalize understanding of mental health resources and other crisis services in the community. They are instructed to call 911 and present to the nearest emergency room should patient experience any suicidal/homicidal ideation, auditory/visual/hallucinations, or detrimental worsening of mental health condition.       [place] ED from 09/16/2022 in [place] Most recent reading at 09/16/2022 12:17 PM ED from 11/22/2020 in [place] Most recent reading at 11/23/2020  7:22 AM ED from 11/22/2020 in [facility] Most recent reading at 11/22/2020  5:32 PM  C-SSRS RISK CATEGORY No Risk Error: Q6 is Yes, you must answer 7 High Risk       Psychiatric Specialty Exam  Presentation  General Appearance:Appropriate for Environment; Casual  Eye Contact:Good  Speech:Clear and Coherent; Normal Rate  Speech Volume:Normal  Handedness:Right   Mood and Affect  Mood: Depressed  Affect: Depressed   Thought Process  Thought Processes: Coherent; Goal Directed; Linear  Descriptions of Associations:Intact  Orientation:Full (Time, Place and Person)  Thought Content:Logical; WDL  Diagnosis of Schizophrenia or Schizoaffective disorder in past: No data recorded Duration of Psychotic Symptoms: No data recorded Hallucinations:None  Ideas of Reference:None  Suicidal Thoughts:Yes, Passive Without Intent; Without Plan  Homicidal Thoughts:No   Sensorium  Memory: Immediate Fair; Recent Fair  Judgment: Intact  Insight: Shallow   Executive  Functions  Concentration: Fair  Attention Span: Fair  Recall: [employer] of Knowledge: Fair  Language: Fair   Psychomotor Activity  Psychomotor Activity: Normal   Assets  Assets: Armed forces logistics/support/administrative officer; Housing; Leisure Time; Physical Health; Resilience; Social Support   Sleep  Sleep: Fair  Number of hours: No data recorded  No data recorded  Physical Exam: Physical Exam Vitals and nursing note reviewed.  Constitutional:      Appearance: Normal appearance. She is well-developed.  HENT:     Head: Normocephalic and atraumatic.     Nose: Nose normal.  Cardiovascular:     Rate and Rhythm: Normal rate.  Pulmonary:     Effort: Pulmonary effort is normal.  Musculoskeletal:        General: Normal range of motion.     Cervical back: Normal range of motion.  Skin:    General: Skin is warm and dry.  Neurological:     Mental Status: She is alert and oriented to person, place, and time.  Psychiatric:        Attention and Perception: Attention and perception normal.        Mood and Affect: Affect normal. Mood is depressed.        Speech: Speech normal.        Behavior: Behavior normal. Behavior is cooperative.        Thought Content: Thought content includes suicidal ideation.        Cognition and Memory: Cognition and memory normal.    Review of Systems  Constitutional: Negative.   HENT: Negative.    Eyes: Negative.   Respiratory: Negative.    Cardiovascular: Negative.   Gastrointestinal: Negative.   Genitourinary: Negative.  Musculoskeletal: Negative.   Skin: Negative.   Neurological: Negative.   Psychiatric/Behavioral:  Positive for depression and suicidal ideas.    Blood pressure 125/77, pulse 96, temperature 98.7 F (37.1 C), temperature source Oral, resp. rate 19, SpO2 100 %. There is no height or weight on file to calculate BMI.  Musculoskeletal: Strength & Muscle Tone: within normal limits Gait & Station: normal Patient leans: N/A   [facility]  MSE Discharge Disposition for Follow up and Recommendations: Based on my evaluation the patient does not appear to have an emergency medical condition and can be discharged with resources and follow up care in outpatient services for Medication Management Follow up with established outpatient psychiatry at [place], [place]. Continue current medications.   Lucky Rathke, FNP 11/02/2022, 4:17 PM

## 2022-11-22 ENCOUNTER — Encounter (HOSPITAL_COMMUNITY): Payer: Self-pay

## 2022-11-22 ENCOUNTER — Emergency Department (HOSPITAL_COMMUNITY): Payer: Medicaid Other

## 2022-11-22 ENCOUNTER — Emergency Department (HOSPITAL_COMMUNITY)
Admission: EM | Admit: 2022-11-22 | Discharge: 2022-11-22 | Disposition: A | Discharge status: Home / Self Care | Payer: Medicaid Other | Attending: Emergency Medicine | Admitting: Emergency Medicine

## 2022-11-22 ENCOUNTER — Other Ambulatory Visit: Payer: Self-pay

## 2022-11-22 DIAGNOSIS — R1031 Right lower quadrant pain: Secondary | ICD-10-CM | POA: Insufficient documentation

## 2022-11-22 DIAGNOSIS — R0789 Other chest pain: Secondary | ICD-10-CM | POA: Diagnosis not present

## 2022-11-22 LAB — COMPREHENSIVE METABOLIC PANEL
ALT: 19 U/L (ref 0–44)
AST: 22 U/L (ref 15–41)
Albumin: 3.4 g/dL — ABNORMAL LOW (ref 3.5–5.0)
Alkaline Phosphatase: 95 U/L (ref 38–126)
Anion gap: 7 (ref 5–15)
BUN: 11 mg/dL (ref 6–20)
CO2: 20 mmol/L — ABNORMAL LOW (ref 22–32)
Calcium: 9 mg/dL (ref 8.9–10.3)
Chloride: 107 mmol/L (ref 98–111)
Creatinine, Ser: 0.58 mg/dL (ref 0.44–1.00)
GFR, Estimated: >60 mL/min (ref >60)
Glucose, Bld: 170 mg/dL — ABNORMAL HIGH (ref 70–99)
Potassium: 4.7 mmol/L (ref 3.5–5.1)
Sodium: 134 mmol/L — ABNORMAL LOW (ref 135–145)
Total Bilirubin: 0.6 mg/dL (ref 0.3–1.2)
Total Protein: 6.8 g/dL (ref 6.5–8.1)

## 2022-11-22 LAB — CBC WITH DIFFERENTIAL/PLATELET
Abs Immature Granulocytes: 0.04 10*3/uL (ref 0.00–0.07)
Basophils Absolute: 0 10*3/uL (ref 0.0–0.1)
Basophils Relative: 0 %
Eosinophils Absolute: 0.3 10*3/uL (ref 0.0–0.5)
Eosinophils Relative: 4 %
HCT: 43.6 % (ref 36.0–46.0)
Hemoglobin: 13.9 g/dL (ref 12.0–15.0)
Immature Granulocytes: 1 %
Lymphocytes Relative: 50 %
Lymphs Abs: 4.1 10*3/uL — ABNORMAL HIGH (ref 0.7–4.0)
MCH: 27.6 pg (ref 26.0–34.0)
MCHC: 31.9 g/dL (ref 30.0–36.0)
MCV: 86.5 fL (ref 80.0–100.0)
Monocytes Absolute: 0.6 10*3/uL (ref 0.1–1.0)
Monocytes Relative: 7 %
Neutro Abs: 3.1 10*3/uL (ref 1.7–7.7)
Neutrophils Relative %: 38 %
Platelets: 324 10*3/uL (ref 150–400)
RBC: 5.04 MIL/uL (ref 3.87–5.11)
RDW: 14.5 % (ref 11.5–15.5)
WBC: 8.2 10*3/uL (ref 4.0–10.5)
nRBC: 0 % (ref 0.0–0.2)

## 2022-11-22 LAB — URINALYSIS, ROUTINE W REFLEX MICROSCOPIC
Bacteria, UA: NONE SEEN
Bilirubin Urine: NEGATIVE
Glucose, UA: NEGATIVE mg/dL
Hgb urine dipstick: NEGATIVE
Ketones, ur: NEGATIVE mg/dL
Nitrite: NEGATIVE
Protein, ur: NEGATIVE mg/dL
Specific Gravity, Urine: 1.012 (ref 1.005–1.030)
pH: 6 (ref 5.0–8.0)

## 2022-11-22 LAB — LACTIC ACID, PLASMA: Lactic Acid, Venous: 1.4 mmol/L (ref 0.5–1.9)

## 2022-11-22 LAB — PREGNANCY, URINE: Preg Test, Ur: NEGATIVE

## 2022-11-22 LAB — LIPASE, BLOOD: Lipase: 37 U/L (ref 11–51)

## 2022-11-22 MED ORDER — LIDOCAINE 4 % EX PTCH: 1.0000 | MEDICATED_PATCH | CUTANEOUS | 0 refills | Status: DC

## 2022-11-22 MED ORDER — LIDOCAINE 5 % EX PTCH
1.0000 | MEDICATED_PATCH | CUTANEOUS | Status: DC
  Administered 2022-11-22: 1 via TRANSDERMAL
  Filled 2022-11-22: qty 1

## 2022-11-22 MED ORDER — ACETAMINOPHEN 325 MG PO TABS
650.0000 mg | ORAL_TABLET | Freq: Once | ORAL | Status: AC
  Administered 2022-11-22: 650 mg via ORAL
  Filled 2022-11-22: qty 2

## 2022-11-22 MED ORDER — IBUPROFEN 400 MG PO TABS
400.0000 mg | ORAL_TABLET | Freq: Once | ORAL | Status: AC
  Administered 2022-11-22: 400 mg via ORAL
  Filled 2022-11-22: qty 1

## 2022-11-22 NOTE — ED Notes (Signed)
Patient has legal guardian, name and number are on the documentation presented from EMS.

## 2022-11-22 NOTE — Discharge Instructions (Addendum)
Please follow-up with your primary care doctor, there are no acute findings today and you will need to follow-up with them.  Please return to the ER if you feel like your abdominal pain is worsening, you have severe nausea or vomiting, or you have severe chest pain.

## 2022-11-22 NOTE — ED Triage Notes (Signed)
Patient reports she works at [employer] and is caring 8 plates instead of 6 and has pain across chest and bilateral arms.  Also reports lower abd pain.  Patient picked up from psych clinic.  New dx DM.  Increased anxiety.

## 2022-11-22 NOTE — ED Provider Notes (Signed)
[place] EMERGENCY DEPARTMENT AT [facility] Provider Note   CSN: [id number] Arrival date & time: 11/22/22  1105     History  Chief Complaint  Patient presents with   Muscle Pain    Tami [name] is a 32 y.o. female, history of bipolar disorder, who presents to the ED secondary to bilateral lower quadrant abdominal discomfort for the last day.  She states that she feels like her abdomen is swollen, and it aches a little bit.  States that she is also having some chest wall tenderness that occurred after she lifted 10 plates at [employer] instead of her typical 3 plates.  She denies any shortness of breath, nausea, vomiting, diarrhea.  Last bowel movement was yesterday.  She states she has been having bowel movements without any difficulty.  Has not taken anything for the pain.     Home Medications Prior to Admission medications   Medication Sig Start Date End Date Taking? Authorizing Provider  lidocaine (HM LIDOCAINE PATCH) 4 % Place 1 patch onto the skin daily. 11/22/22  Yes Jatoya Armbrister L, PA  AMITIZA 24 MCG capsule Take 24 mcg by mouth 2 (two) times daily as needed for constipation. 04/21/20   [provider]  atorvastatin (LIPITOR) 40 MG tablet Take 40 mg by mouth at bedtime. 05/01/20   [provider]  ciclopirox (PENLAC) 8 % solution APPLY TOPICALLY AT BEDTIME, APPLY OVER NAIL & SURROUNDING SKIN, APPLY DAILY OVER PREVIOUS COAT, AFTER 7 DAYS MAY REMOVE WITH ALCOHOL AND CONTINUE CYCLE. 07/07/21   Candelaria Stagers, DPM  diclofenac Sodium (VOLTAREN) 1 % GEL Apply 4 g topically 2 (two) times daily. 04/29/20   [provider]  divalproex (DEPAKOTE) 500 MG DR tablet Take 1 tablet (500 mg total) by mouth every 12 (twelve) hours. 10/30/19   Aldean Baker, NP  Dulaglutide (TRULICITY) 0.75 MG/0.5ML SOPN Inject 0.75 mg into the skin once a week.    [provider]  FANAPT 4 MG TABS tablet Take 4 mg by mouth 3 (three) times daily. 11/01/20    [provider]  gabapentin (NEURONTIN) 100 MG capsule Take 2 capsules (200 mg total) by mouth 2 (two) times daily. 11/23/20 12/23/20  Melene Plan, DO  hydrOXYzine (ATARAX/VISTARIL) 25 MG tablet Take 1 tablet (25 mg total) by mouth 3 (three) times daily as needed for anxiety. Patient taking differently: Take 25 mg by mouth 3 (three) times daily. 10/30/19   Aldean Baker, NP  ibuprofen (ADVIL) 800 MG tablet Take 800 mg by mouth 3 (three) times daily as needed for mild pain or moderate pain.     [provider]  levothyroxine (SYNTHROID) 25 MCG tablet Take 1 tablet (25 mcg total) by mouth daily at 6 (six) AM. 10/31/19   Aldean Baker, NP  medroxyPROGESTERone (DEPO-PROVERA) 150 MG/ML injection Inject 150 mg into the muscle every 3 (three) months.  09/07/19   [provider]  medroxyPROGESTERone Acetate 150 MG/ML SUSY Inject into the muscle every 21 ( twenty-one) days. 11/26/20   [provider]  meloxicam (MOBIC) 15 MG tablet Take 15 mg by mouth daily.    [provider]  Multiple Vitamin (MULTIVITAMIN WITH MINERALS) TABS tablet Take 1 tablet by mouth daily.    [provider]  naproxen (NAPROSYN) 500 MG tablet Take 500 mg by mouth 2 (two) times daily with a meal.    [provider]  Semaglutide (RYBELSUS) 3 MG TABS Take 3 mg by mouth daily  before breakfast. 09/10/20   [provider]  traZODone (DESYREL) 100 MG tablet Take 200 mg by mouth at bedtime as needed for sleep.    [provider]      Allergies    Codeine, Diphenhydramine, and Penicillins    Review of Systems   Review of Systems  Gastrointestinal:  Positive for abdominal pain. Negative for constipation, nausea and vomiting.    Physical Exam Updated Vital Signs BP 120/87 (BP Location: Left Arm)   Pulse 91   Temp 99 F (37.2 C) (Oral)   Resp 18   Ht 4\' 9"  (1.448 m)   Wt 98 kg   SpO2 100%   BMI 46.74 kg/m  Physical Exam Vitals and nursing note  reviewed.  Constitutional:      General: She is not in acute distress.    Appearance: She is well-developed.  HENT:     Head: Normocephalic and atraumatic.  Eyes:     Conjunctiva/sclera: Conjunctivae normal.  Cardiovascular:     Rate and Rhythm: Normal rate and regular rhythm.     Heart sounds: No murmur heard. Pulmonary:     Effort: Pulmonary effort is normal. No respiratory distress.     Breath sounds: Normal breath sounds.  Abdominal:     Palpations: Abdomen is soft.     Tenderness: There is abdominal tenderness in the right lower quadrant.     Comments: Mild tenderness to palpation of the right lower quadrant without guarding or rebound.  Musculoskeletal:        General: No swelling.     Cervical back: Neck supple.  Skin:    General: Skin is warm and dry.     Capillary Refill: Capillary refill takes less than 2 seconds.  Neurological:     Mental Status: She is alert.  Psychiatric:        Mood and Affect: Mood normal.     ED Results / Procedures / Treatments   Labs (all labs ordered are listed, but only abnormal results are displayed) Labs Reviewed  CBC WITH DIFFERENTIAL/PLATELET - Abnormal; Notable for the following components:      Result Value   Lymphs Abs 4.1 (*)    All other components within normal limits  COMPREHENSIVE METABOLIC PANEL - Abnormal; Notable for the following components:   Sodium 134 (*)    CO2 20 (*)    Glucose, Bld 170 (*)    Albumin 3.4 (*)    All other components within normal limits  URINALYSIS, ROUTINE W REFLEX MICROSCOPIC - Abnormal; Notable for the following components:   APPearance HAZY (*)    Leukocytes,Ua TRACE (*)    All other components within normal limits  LIPASE, BLOOD  LACTIC ACID, PLASMA  PREGNANCY, URINE    EKG None  Radiology DG Abdomen 1 View  Result Date: 11/22/2022 CLINICAL DATA:  Lower abdominal pain EXAM: ABDOMEN - 1 VIEW COMPARISON:  None Available. FINDINGS: The bowel gas pattern is normal. Moderate stool  burden. No radio-opaque calculi or other significant radiographic abnormality are seen. IMPRESSION: Negative. Electronically Signed   By: Yetta Glassman M.D.   On: 11/22/2022 17:37   DG Chest 1 View  Result Date: 11/22/2022 CLINICAL DATA:  Chest wall pain beginning today. EXAM: CHEST  1 VIEW COMPARISON:  06/20/2014 FINDINGS: Heart size is normal. Mediastinal shadows are normal. The lungs are clear. No infiltrate, collapse or effusion. No abnormal bone finding. IMPRESSION: No active disease. Electronically Signed   By: Jan Fireman.D.  On: 11/22/2022 11:50    Procedures Procedures   Medications Ordered in ED Medications  lidocaine (LIDODERM) 5 % 1 patch (1 patch Transdermal Patch Applied 11/22/22 1407)  ibuprofen (ADVIL) tablet 400 mg (400 mg Oral Given 11/22/22 1203)  acetaminophen (TYLENOL) tablet 650 mg (650 mg Oral Given 11/22/22 1406)  ibuprofen (ADVIL) tablet 400 mg (400 mg Oral Given 11/22/22 1814)    ED Course/ Medical Decision Making/ A&P   {                           Medical Decision Making Patient is a 32 year old female, here for chest wall tenderness after picking up some heavy plates, we will obtain EKG, and chest x-ray for further evaluation she has tenderness to palpation of her chest wall and we will also give her lidocaine patch.  Additionally she complains of some bilateral lower quadrant abdominal pain, that is been going on, and states that it just hurts in nature.  Denies any nausea or vomiting no diarrhea.  We will obtain a KUB, labs including CBC, CMP, urinalysis, Preg, lactic acid and lipase for further evaluation.  She is overall well-appearing and I do not feel that she needs a CT abdomen pelvis at this time as she is only mild tenderness to palpation.  Amount and/or Complexity of Data Reviewed Labs: ordered.    Details: Unremarkable, no evidence of leukocytosis Radiology: ordered.    Details: KUB and chest x-ray unremarkable ECG/medicine tests:     Details:  Normal sinus rhythm Discussion of management or test interpretation with external provider(s): Patient's the EKG shows normal sinus rhythm, x-ray shows no acute findings of chest wall.  Likely musculoskeletal tenderness from picking up heavy objects.  Additionally KUB is unremarkable showing no constipation, labs are also within normal limits, she was advised to follow-up with her primary care doctor, pain improved with Tylenol ibuprofen, return precautions emphasized.  Attempted to contact guardian, no answer.  Nursing aware of this.  They will follow-up.  Risk OTC drugs. Prescription drug management.   Final Clinical Impression(s) / ED Diagnoses Final diagnoses:  Right lower quadrant abdominal pain  Chest wall pain    Rx / DC Orders ED Discharge Orders          Ordered    lidocaine (HM LIDOCAINE PATCH) 4 %  Every 24 hours        11/22/22 1750              Taren Toops, [place], PA 11/22/22 Jeri Lager    Fredia Sorrow, MD 11/24/22 1909

## 2022-11-22 NOTE — ED Notes (Signed)
Attempted to notify group home of pending discharge with no answer, will attempt again.

## 2022-11-22 NOTE — ED Notes (Signed)
Pt transported to XR.  

## 2022-11-22 NOTE — ED Provider Triage Note (Signed)
Emergency Medicine Provider Triage Evaluation Note  Tami [name] , a 32 y.o. female  was evaluated in triage.  Pt complains of chest wall pain after lifting 8 plates at [facility] instead of 3 plates. Also c/o bilateral lower abdominal pain.  Review of Systems  Positive: Chest wall pain, abdominal pain Negative: fever  Physical Exam  Ht 4\' 9"  (1.448 m)   Wt 98 kg   BMI 46.74 kg/m  Gen:   Awake, no distress   Resp:  Normal effort  MSK:   Moves extremities without difficulty  Other:  Ttp of bilateral abdominal quadrants  Medical Decision Making  Medically screening exam initiated at 11:16 AM.  Appropriate orders placed.  [place] was informed that the remainder of the evaluation will be completed by another provider, this initial triage assessment does not replace that evaluation, and the importance of remaining in the ED until their evaluation is complete.    Osvaldo Shipper, [place] 11/22/22 1116

## 2022-12-07 ENCOUNTER — Encounter: Payer: Self-pay | Admitting: Podiatry

## 2022-12-07 ENCOUNTER — Ambulatory Visit (INDEPENDENT_AMBULATORY_CARE_PROVIDER_SITE_OTHER): Payer: Medicaid Other | Admitting: Podiatry

## 2022-12-07 VITALS — BP 106/65 | HR 91 | Temp 98.0°F

## 2022-12-07 DIAGNOSIS — E119 Type 2 diabetes mellitus without complications: Secondary | ICD-10-CM

## 2022-12-07 DIAGNOSIS — L600 Ingrowing nail: Secondary | ICD-10-CM

## 2022-12-07 DIAGNOSIS — M79674 Pain in right toe(s): Secondary | ICD-10-CM | POA: Diagnosis not present

## 2022-12-07 DIAGNOSIS — M79675 Pain in left toe(s): Secondary | ICD-10-CM | POA: Diagnosis not present

## 2022-12-07 DIAGNOSIS — B351 Tinea unguium: Secondary | ICD-10-CM | POA: Diagnosis not present

## 2022-12-07 NOTE — Progress Notes (Unsigned)
  Subjective:  Patient ID: Tami [name], female    DOB: Jan 07, 1991,  MRN: [id number]  [place] presents to clinic today for {jgcomplaint:23593}  Chief Complaint  Patient presents with   Follow-up    Diabetic foot care Patient is a diabetic B.S.am-n/a Last A1c-135 PCP-Dr Imran Hague/Last visit-10/20/22 Patient is taking all medications prescribed.   New problem(s): None. {jgcomplaint:23593}  PCP is Hague, Rosalyn Charters, MD.  Allergies  Allergen Reactions   Diphenhydramine     Itch  Other reaction(s): Unknown   Penicillins     Hives     Review of Systems: Negative except as noted in the HPI.  Objective: No changes noted in today's physical examination. Vitals:   12/07/22 0941  BP: 106/65  Pulse: 91  Temp: 98 F (36.7 C)  SpO2: 94%   Tami [name] is a pleasant 32 y.o. female obese in NAD. AAO x 3.  Vascular Examination: Capillary refill time immediate b/l.Vascular status intact b/l with palpable pedal pulses. Pedal hair present b/l. No edema. No pain with calf compression b/l. Skin temperature gradient WNL b/l.   Neurological Examination: Sensation grossly intact b/l with 10 gram monofilament. Vibratory sensation intact b/l.   Dermatological Examination: Pedal skin with normal turgor, texture and tone b/l. Toenails 1-5 b/l thick, discolored, elongated with subungual debris and pain on dorsal palpation.   Musculoskeletal Examination: HAV with bunion bilaterally and hammertoes 2-5 b/l.  Radiographs: None Assessment/Plan: 1. Pain due to onychomycosis of toenails of both feet     No orders of the defined types were placed in this encounter.   None {Jgplan:23602::"-Patient/POA to call should there be question/concern in the interim."}   Return in about 3 months (around 03/07/2023).  Marzetta Board, DPM

## 2022-12-07 NOTE — Progress Notes (Incomplete)
  Subjective:  Patient ID: Tami [name], female    DOB: [birth year],  MRN: [id number]  [place] presents to clinic today for preventative diabetic foot care and ingrown toenail to the bilateral great toes. Patient states she does not want to have ingrowns removed. She has had procedure in the past which "hurt when they sprayed the cold spray". She is also nervous about the length of the needle for the local injection. Chief Complaint  Patient presents with  . Follow-up    Diabetic foot care Patient is a diabetic B.S.am-n/a Last A1c-135 PCP-Dr Imran Hague/Last visit-10/20/22 Patient is taking all medications prescribed.   New problem(s): None.   PCP is Hague, Rosalyn Charters, MD.  Allergies  Allergen Reactions  . Diphenhydramine     Itch  Other reaction(s): Unknown  . Penicillins     Hives     Review of Systems: Negative except as noted in the HPI.  Objective: No changes noted in today's physical examination. Vitals:   12/07/22 0941  BP: 106/65  Pulse: 91  Temp: 98 F (36.7 C)  SpO2: 94%   Tami [name] is a pleasant 32 y.o. female obese in NAD. AAO x 3.  Vascular Examination: Capillary refill time immediate b/l.Vascular status intact b/l with palpable pedal pulses. Pedal hair present b/l. No edema. No pain with calf compression b/l. Skin temperature gradient WNL b/l.   Neurological Examination: Sensation grossly intact b/l with 10 gram monofilament. Vibratory sensation intact b/l.   Dermatological Examination: Pedal skin with normal turgor, texture and tone b/l. Toenails 1-5 b/l thick, discolored, elongated with subungual debris and pain on dorsal palpation.   Incurvated nailplate lateral border right hallux with tenderness to palpation. No erythema, no edema, no drainage noted. No fluctuance.   Musculoskeletal Examination: HAV with bunion bilaterally and hammertoes 2-5 b/l.  Radiographs: None Assessment/Plan: 1. Pain due to onychomycosis of  toenails of both feet     -Patient was evaluated and treated. All patient's and/or POA's questions/concerns answered on today's visit. -Caregiver, Cathren Laine and I explained matrixectomy procedure to patient. She declines on today's visit. -Toenails 2-5 bilaterally and L hallux debrided in length and girth without iatrogenic bleeding with sterile nail nipper and dremel.  -Offending nail border debrided and curretaged lateral border of right foot utilizing sterile nail nipper and currette. Border(s) cleansed with alcohol and triple antibiotic ointment applied. Dispensed written instructions for once daily epsom salt soaks for 7 days. Call office if there are any concerns. -Patient/POA to call should there be question/concern in the interim.   Return in about 3 months (around 03/07/2023).  Marzetta Board, DPM

## 2022-12-07 NOTE — Patient Instructions (Signed)
If condition does not improve, schedule appointment with Dr. Lanae Crumbly for permanent ingrown toenail removal.  EPSOM SALT FOOT SOAK INSTRUCTIONS  Shopping List:  A. Plain epsom salt (not scented) B. Neosporin Cream C. 1-inch fabric band-aids   Place 1/4 cup of epsom salts in 2 quarts of warm tap water. IF YOU ARE DIABETIC, OR HAVE NEUROPATHY, CHECK THE TEMPERATURE OF THE WATER WITH YOUR ELBOW.   Submerge your foot/feet in the solution and soak for 10-15 minutes.      3.  Next, remove your foot/feet from solution, blot dry the affected area.    4.  Apply light amount of antibiotic cream/ointment and cover with fabric band-aid .  5.  This soak should be done once a day for 7 days.   6.  Monitor for any signs/symptoms of infection such as redness, swelling, odor, drainage, increased pain, or non-healing of digit.   7.  Please do not hesitate to call the office and speak to a Nurse or Doctor if you have questions.   8.  If you experience fever, chills, nightsweats, nausea or vomiting with worsening of digit/foot, please go to the emergency room.

## 2023-03-22 ENCOUNTER — Ambulatory Visit: Payer: Medicaid Other | Admitting: Podiatry

## 2023-03-30 ENCOUNTER — Ambulatory Visit (INDEPENDENT_AMBULATORY_CARE_PROVIDER_SITE_OTHER): Payer: Medicaid Other | Admitting: Podiatry

## 2023-03-30 ENCOUNTER — Encounter: Payer: Self-pay | Admitting: Podiatry

## 2023-03-30 DIAGNOSIS — B351 Tinea unguium: Secondary | ICD-10-CM | POA: Diagnosis not present

## 2023-03-30 DIAGNOSIS — M79674 Pain in right toe(s): Secondary | ICD-10-CM | POA: Diagnosis not present

## 2023-03-30 DIAGNOSIS — E119 Type 2 diabetes mellitus without complications: Secondary | ICD-10-CM

## 2023-03-30 DIAGNOSIS — M79675 Pain in left toe(s): Secondary | ICD-10-CM | POA: Diagnosis not present

## 2023-03-30 NOTE — Progress Notes (Signed)
  Subjective:  Patient ID: Tami [name], female    DOB: 1991-05-02,   MRN: [id number]  Chief Complaint  Patient presents with   Nail Problem    Rm 22 RFC bilateral nail trim    32 y.o. female presents for concern of thickened elongated and painful nails that are difficult to trim. Requesting to have them trimmed today. History of ingrown nails and wants to avoid procedure.  Relates burning and tingling in their feet. Patient is diabetic and last A1c was  Lab Results  Component Value Date   HGBA1C 4.6 (L) 10/26/2019   .   PCP:  Galvin Proffer, MD     PCP:  Galvin Proffer, MD    . Denies any other pedal complaints. Denies n/v/f/c.   Past Medical History:  Diagnosis Date   ADHD (attention deficit hyperactivity disorder)    Anxiety    Bipolar disorder (HCC)    Chronic back pain    Cognitive developmental delay    Constipation    Depression    Diabetes mellitus without complication (HCC)    Type II - diabetes- 07/22/20- hasn't started on medicaion   HLD (hyperlipidemia)    Hypothyroidism    Seizures (HCC)     Objective:  Physical Exam: Vascular: DP/PT pulses 2/4 bilateral. CFT <3 seconds. Normal hair growth on digits. No edema.  Skin. No lacerations or abrasions bilateral feet. Nails 1-5 bilateral are thickened elongated and dystrophic Musculoskeletal: MMT 5/5 bilateral lower extremities in DF, PF, Inversion and Eversion. Deceased ROM in DF of ankle joint.  Neurological: Sensation intact to light touch.   Assessment:  No diagnosis found.   Plan:  Patient was evaluated and treated and all questions answered. -Discussed and educated patient on diabetic foot care, especially with  regards to the vascular, neurological and musculoskeletal systems.  -Stressed the importance of good glycemic control and the detriment of not  controlling glucose levels in relation to the foot. -Discussed supportive shoes at all times and checking feet regularly.  -Mechanically  debrided all nails 1-5 bilateral using sterile nail nipper and filed with dremel without incident  -Answered all patient questions -Patient to return  in 3 months for at risk foot care -Patient advised to call the office if any problems or questions arise in the meantime.   Louann Sjogren, DPM

## 2023-06-29 ENCOUNTER — Encounter: Payer: Self-pay | Admitting: Podiatry

## 2023-06-29 ENCOUNTER — Ambulatory Visit (INDEPENDENT_AMBULATORY_CARE_PROVIDER_SITE_OTHER): Payer: MEDICAID | Admitting: Podiatry

## 2023-06-29 DIAGNOSIS — B351 Tinea unguium: Secondary | ICD-10-CM | POA: Diagnosis not present

## 2023-06-29 DIAGNOSIS — M79675 Pain in left toe(s): Secondary | ICD-10-CM

## 2023-06-29 DIAGNOSIS — E119 Type 2 diabetes mellitus without complications: Secondary | ICD-10-CM

## 2023-06-29 DIAGNOSIS — M79674 Pain in right toe(s): Secondary | ICD-10-CM | POA: Diagnosis not present

## 2023-06-29 NOTE — Progress Notes (Signed)
This patient returns to my office for at risk foot care.  This patient requires this care by a professional since this patient will be at risk due to having diabetes.  She presents to the office with a caregiver.  She says she does want her big toes treated.  This patient is unable to cut nails herself since the patient cannot reach her nails.These nails are painful walking and wearing shoes.  This patient presents for at risk foot care today.  General Appearance  Alert, conversant and in no acute stress.  Vascular  Dorsalis pedis and posterior tibial  pulses are palpable  bilaterally.  Capillary return is within normal limits  bilaterally. Temperature is within normal limits  bilaterally.  Neurologic  Senn-Weinstein monofilament wire test within normal limits  bilaterally. Muscle power within normal limits bilaterally.  Nails Thick disfigured discolored nails with subungual debris  from second to fifth toes bilaterally. No evidence of bacterial infection or drainage bilaterally.Hallux nails are pincer nails.  Orthopedic  No limitations of motion  feet .  No crepitus or effusions noted.  No bony pathology or digital deformities noted.  Skin  normotropic skin with no porokeratosis noted bilaterally.  No signs of infections or ulcers noted.     Onychomycosis  Pain in right toes  Pain in left toes  Consent was obtained for treatment procedures.   Mechanical debridement of nails 2-5  bilaterally performed with a nail nipper.  Filed with dremel without incident.    Return office visit    4 months                  Told patient to return for periodic foot care and evaluation due to potential at risk complications.   Helane Gunther DPM

## 2023-10-04 ENCOUNTER — Ambulatory Visit: Payer: MEDICAID | Admitting: Podiatry

## 2024-01-11 ENCOUNTER — Other Ambulatory Visit: Payer: Self-pay

## 2024-01-11 ENCOUNTER — Encounter (HOSPITAL_COMMUNITY): Payer: Self-pay

## 2024-01-11 ENCOUNTER — Emergency Department (HOSPITAL_COMMUNITY)
Admission: EM | Admit: 2024-01-11 | Discharge: 2024-01-11 | Disposition: A | Discharge status: Home / Self Care | Payer: MEDICAID

## 2024-01-11 DIAGNOSIS — X500XXA Overexertion from strenuous movement or load, initial encounter: Secondary | ICD-10-CM | POA: Insufficient documentation

## 2024-01-11 DIAGNOSIS — M79632 Pain in left forearm: Secondary | ICD-10-CM | POA: Insufficient documentation

## 2024-01-11 DIAGNOSIS — Y990 Civilian activity done for income or pay: Secondary | ICD-10-CM | POA: Diagnosis not present

## 2024-01-11 DIAGNOSIS — M79631 Pain in right forearm: Secondary | ICD-10-CM | POA: Insufficient documentation

## 2024-01-11 DIAGNOSIS — M791 Myalgia, unspecified site: Secondary | ICD-10-CM

## 2024-01-11 MED ORDER — IBUPROFEN 400 MG PO TABS
400.0000 mg | ORAL_TABLET | Freq: Once | ORAL | Status: AC
  Administered 2024-01-11: 400 mg via ORAL
  Filled 2024-01-11: qty 1

## 2024-01-11 MED ORDER — ACETAMINOPHEN 500 MG PO TABS
1000.0000 mg | ORAL_TABLET | Freq: Once | ORAL | Status: AC
  Administered 2024-01-11: 1000 mg via ORAL
  Filled 2024-01-11: qty 2

## 2024-01-11 NOTE — ED Triage Notes (Addendum)
 Pt arrives via EMS from [facility] Pt reports pain and swelling to bilateral arms since yesterday. Denies injury. Pt also reports she is very anxious. Pt has a legal guardian Dorathy Daft). This RN attempted to call, no answer.

## 2024-01-11 NOTE — ED Provider Notes (Signed)
 [place] EMERGENCY DEPARTMENT AT [facility] Provider Note   CSN: [id number] Arrival date & time: 01/11/24  1521     History  Chief Complaint  Patient presents with   Arm Pain    Tami [name] is a 33 y.o. female.  This is a 32 year old female presenting emergency department for pain in her bilateral proximal forearms for the past 48 hours.  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  Noticed symptoms over the past 2 days.  Feels like she has pain and swelling to the pronator teres area.  No numbness tingling changes in sensation.  Has not taking any over-the-counter medications.   Arm Pain       Home Medications Prior to Admission medications   Medication Sig Start Date End Date Taking? Authorizing Provider  AMITIZA 24 MCG capsule Take 24 mcg by mouth 2 (two) times daily as needed for constipation. 04/21/20   [provider]  atorvastatin (LIPITOR) 40 MG tablet Take 40 mg by mouth at bedtime. 05/01/20   [provider]  ciclopirox (PENLAC) 8 % solution APPLY TOPICALLY AT BEDTIME, APPLY OVER NAIL & SURROUNDING SKIN, APPLY DAILY OVER PREVIOUS COAT, AFTER 7 DAYS MAY REMOVE WITH ALCOHOL AND CONTINUE CYCLE. 07/07/21   Candelaria Stagers, DPM  diclofenac Sodium (VOLTAREN) 1 % GEL Apply 4 g topically 2 (two) times daily. 04/29/20   [provider]  divalproex (DEPAKOTE) 500 MG DR tablet Take 1 tablet (500 mg total) by mouth every 12 (twelve) hours. 10/30/19   Aldean Baker, NP  Dulaglutide (TRULICITY) 0.75 MG/0.5ML SOPN Inject 0.75 mg into the skin once a week.    [provider]  FANAPT 4 MG TABS tablet Take 4 mg by mouth 3 (three) times daily. 11/01/20   [provider]  gabapentin (NEURONTIN) 100 MG capsule Take 2 capsules (200 mg total) by mouth 2 (two) times daily. 11/23/20 12/23/20  Melene Plan, DO  hydrOXYzine (ATARAX/VISTARIL) 25 MG tablet Take 1 tablet (25 mg total) by mouth 3 (three) times daily as needed for  anxiety. Patient taking differently: Take 25 mg by mouth 3 (three) times daily. 10/30/19   Aldean Baker, NP  ibuprofen (ADVIL) 800 MG tablet Take 800 mg by mouth 3 (three) times daily as needed for mild pain or moderate pain.     [provider]  levothyroxine (SYNTHROID) 25 MCG tablet Take 1 tablet (25 mcg total) by mouth daily at 6 (six) AM. 10/31/19   Aldean Baker, NP  lidocaine (HM LIDOCAINE PATCH) 4 % Place 1 patch onto the skin daily. 11/22/22   Small, Brooke L, PA  medroxyPROGESTERone (DEPO-PROVERA) 150 MG/ML injection Inject 150 mg into the muscle every 3 (three) months.  09/07/19   [provider]  medroxyPROGESTERone Acetate 150 MG/ML SUSY Inject into the muscle every 21 ( twenty-one) days. 11/26/20   [provider]  meloxicam (MOBIC) 15 MG tablet Take 15 mg by mouth daily.    [provider]  Multiple Vitamin (MULTIVITAMIN WITH MINERALS) TABS tablet Take 1 tablet by mouth daily.    [provider]  naproxen (NAPROSYN) 500 MG tablet Take 500 mg by mouth 2 (two) times daily with a meal.    [provider]  Semaglutide (RYBELSUS) 3 MG TABS Take 3 mg by mouth daily before breakfast. 09/10/20   [provider]  traZODone (DESYREL) 100 MG tablet Take 200 mg by mouth at bedtime as needed for sleep.    [provider]      Allergies    Diphenhydramine and Penicillins    Review of Systems   Review of Systems  Physical Exam Updated Vital Signs BP (!) 152/103   Pulse (!) 109   Temp 98.1 F (36.7 C)   Resp 17   SpO2 100%  Physical Exam Vitals and nursing note reviewed.  Constitutional:      General: She is not in acute distress.    Appearance: She is not toxic-appearing.  HENT:     Head: Normocephalic.     Nose: Nose normal.     Mouth/Throat:     Mouth: Mucous membranes are moist.  Eyes:     Conjunctiva/sclera: Conjunctivae normal.  Cardiovascular:     Rate and Rhythm: Normal rate.  Abdominal:      General: Abdomen is flat. There is no distension.  Musculoskeletal:     Comments: Patient has some tenderness to the muscle bellies of the pronator teres.  2+ radial pulses bilaterally.  Neurovascularly intact.  No edema to the upper extremities.  Skin:    Capillary Refill: Capillary refill takes less than 2 seconds.  Neurological:     General: No focal deficit present.     Mental Status: She is alert.  Psychiatric:        Mood and Affect: Mood normal.        Behavior: Behavior normal.     ED Results / Procedures / Treatments   Labs (all labs ordered are listed, but only abnormal results are displayed) Labs Reviewed - No data to display  EKG None  Radiology No results found.  Procedures Procedures    Medications Ordered in ED Medications  acetaminophen (TYLENOL) tablet 1,000 mg (has no administration in time range)  ibuprofen (ADVIL) tablet 400 mg (has no administration in time range)    ED Course/ Medical Decision Making/ A&P                                 Medical Decision Making There is a well-appearing 33 year old female presenting emergency department for pain to her bilateral forearms.  Mildly elevated heart rate, suspect secondary to pain/anxiety.  Given Tylenol Motrin.  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  I suspect delayed onset muscle soreness.  She is low risk for DVT based on Wells score, has no edema to the hands.  Good pulses.  Limb ischemia unlikely.  No signs of bacterial infection/cellulitis.  Discussed expectant management.  Stable for discharge at this time.  Risk OTC drugs. Prescription drug management.         Final Clinical Impression(s) / ED Diagnoses Final diagnoses:  None    Rx / DC Orders ED Discharge Orders     None         Coral Spikes, DO 01/11/24 1548

## 2024-01-11 NOTE — Discharge Instructions (Addendum)
 Please follow-up with your primary doctor.  You may take over-the-counter Tylenol alternating with ibuprofen for pain.  Please follow-up with your primary doctor.  Return immediately felt fevers, chills, chest pain, shortness of breath, numbness tingling changes in sensation in your arms or any new or worsening symptoms that are concerning to you.

## 2024-02-21 ENCOUNTER — Other Ambulatory Visit: Payer: Self-pay

## 2024-02-21 ENCOUNTER — Emergency Department (HOSPITAL_COMMUNITY)
Admission: EM | Admit: 2024-02-21 | Discharge: 2024-02-21 | Disposition: A | Discharge status: Home / Self Care | Payer: MEDICAID | Attending: Emergency Medicine | Admitting: Emergency Medicine

## 2024-02-21 ENCOUNTER — Encounter (HOSPITAL_COMMUNITY): Payer: Self-pay | Admitting: *Deleted

## 2024-02-21 ENCOUNTER — Emergency Department (HOSPITAL_COMMUNITY): Payer: MEDICAID

## 2024-02-21 DIAGNOSIS — E039 Hypothyroidism, unspecified: Secondary | ICD-10-CM | POA: Diagnosis not present

## 2024-02-21 DIAGNOSIS — E119 Type 2 diabetes mellitus without complications: Secondary | ICD-10-CM | POA: Insufficient documentation

## 2024-02-21 DIAGNOSIS — X500XXA Overexertion from strenuous movement or load, initial encounter: Secondary | ICD-10-CM | POA: Diagnosis not present

## 2024-02-21 DIAGNOSIS — S161XXA Strain of muscle, fascia and tendon at neck level, initial encounter: Secondary | ICD-10-CM | POA: Insufficient documentation

## 2024-02-21 DIAGNOSIS — Y990 Civilian activity done for income or pay: Secondary | ICD-10-CM | POA: Diagnosis not present

## 2024-02-21 DIAGNOSIS — Z79890 Hormone replacement therapy (postmenopausal): Secondary | ICD-10-CM | POA: Diagnosis not present

## 2024-02-21 DIAGNOSIS — M62838 Other muscle spasm: Secondary | ICD-10-CM | POA: Insufficient documentation

## 2024-02-21 DIAGNOSIS — M542 Cervicalgia: Secondary | ICD-10-CM | POA: Diagnosis present

## 2024-02-21 MED ORDER — ACETAMINOPHEN 325 MG PO TABS
650.0000 mg | ORAL_TABLET | Freq: Once | ORAL | Status: AC
  Administered 2024-02-21: 650 mg via ORAL
  Filled 2024-02-21: qty 2

## 2024-02-21 MED ORDER — NAPROXEN 500 MG PO TABS: 500.0000 mg | ORAL_TABLET | Freq: Two times a day (BID) | ORAL | 0 refills | Status: DC

## 2024-02-21 MED ORDER — METHOCARBAMOL 500 MG PO TABS: 500.0000 mg | ORAL_TABLET | Freq: Two times a day (BID) | ORAL | 0 refills | Status: DC

## 2024-02-21 MED ORDER — NAPROXEN 250 MG PO TABS
500.0000 mg | ORAL_TABLET | Freq: Once | ORAL | Status: AC
  Administered 2024-02-21: 500 mg via ORAL
  Filled 2024-02-21: qty 2

## 2024-02-21 MED ORDER — LIDOCAINE 5 % EX PTCH
1.0000 | MEDICATED_PATCH | Freq: Once | CUTANEOUS | Status: DC
  Administered 2024-02-21: 1 via TRANSDERMAL
  Filled 2024-02-21: qty 1

## 2024-02-21 NOTE — ED Provider Triage Note (Signed)
 Emergency Medicine Provider Triage Evaluation Note  Tami [name] , a 33 y.o. female  was evaluated in triage.  Pt complains of neck pain intermittently for 2 weeks.  Lift heavy objects at work.  Sharp pain that feels like electricity.  No numbness tingling changes in sensation.  Review of Systems  Positive: Neck pain  Negative: Weakness   Physical Exam  BP 118/77 (BP Location: Right Arm)   Pulse (!) 101   Temp 98.6 F (37 C)   Resp 18   SpO2 98%  Gen:   Awake, no distress  Resp:  Normal effort  MSK:   Moves extremities without difficulty.  Other:  No midline spinal tenderness; tender to muscles of neck.   Medical Decision Making  Medically screening exam initiated at 11:39 AM.  Appropriate orders placed.  Tami [name] was informed that the remainder of the evaluation will be completed by another provider, this initial triage assessment does not replace that evaluation, and the importance of remaining in the ED until their evaluation is complete.  Give lidocaine  patch.    Rolinda Climes, DO 02/21/24 1139

## 2024-02-21 NOTE — ED Notes (Signed)
 RN attempted to notify legal guardian that pt was being discharged.

## 2024-02-21 NOTE — Discharge Instructions (Signed)
 You were seen in the ER today for neck pain.  The scan of your head looked normal.  I have sent a prescription for muscle relaxer and an anti-inflammatory.

## 2024-02-21 NOTE — ED Triage Notes (Addendum)
 Pt has had shooting pains up her neck into head for several weeks, not associated with any trauma or neuro deficits.  Pt states that she lives at group home [facility] and has a legal guardian, called legal guardian, not able to reach them, [address] [phone]. Group home owner [place] [phone]. Called, no answer

## 2024-02-21 NOTE — ED Notes (Signed)
 Pt was called x3 for vital update no answer will try again.

## 2024-02-21 NOTE — ED Provider Notes (Signed)
 [facility] Provider Note   CSN: [id number] Arrival date & time: 02/21/24  9562     History  Chief Complaint  Patient presents with   Headache    Tami [name] is a 33 y.o. female with history of anxiety, bipolar disorder, chronic back pain, cognitive developmental delay, depression, diabetes, hyperlipidemia, hypothyroidism, seizures, polysubstance abuse, who presents emergency department complaining of headache and neck pain.  Patient states that she is intermittently having some shooting pains in the back of her neck for a few weeks.  She had 1 episode of pain radiating into her back.  She is not having any back pain at present.  She states that she was given Tylenol  and had no improvement.  Symptoms radiates into her fingertips and bilateral hands.  Denies any fever or chills.  She does do heavy lifting at work.  Patient is adamant that she wants a head CT to evaluate for a "cyst on her head" that she had previously.   Headache Associated symptoms: neck pain        Home Medications Prior to Admission medications   Medication Sig Start Date End Date Taking? Authorizing Provider  methocarbamol  (ROBAXIN ) 500 MG tablet Take 1 tablet (500 mg total) by mouth 2 (two) times daily. 02/21/24  Yes Cedrick Partain T, PA-C  naproxen  (NAPROSYN ) 500 MG tablet Take 1 tablet (500 mg total) by mouth 2 (two) times daily. 02/21/24  Yes Jahmere Bramel T, PA-C  AMITIZA  24 MCG capsule Take 24 mcg by mouth 2 (two) times daily as needed for constipation. 04/21/20   [provider]  atorvastatin  (LIPITOR) 40 MG tablet Take 40 mg by mouth at bedtime. 05/01/20   [provider]  ciclopirox  (PENLAC ) 8 % solution APPLY TOPICALLY AT BEDTIME, APPLY OVER NAIL & SURROUNDING SKIN, APPLY DAILY OVER PREVIOUS COAT, AFTER 7 DAYS MAY REMOVE WITH ALCOHOL AND CONTINUE CYCLE. 07/07/21   Velma Ghazi, DPM  diclofenac Sodium (VOLTAREN) 1 % GEL Apply 4 g  topically 2 (two) times daily. 04/29/20   [provider]  divalproex  (DEPAKOTE ) 500 MG DR tablet Take 1 tablet (500 mg total) by mouth every 12 (twelve) hours. 10/30/19   Rochell Chroman, NP  Dulaglutide (TRULICITY) 0.75 MG/0.5ML SOPN Inject 0.75 mg into the skin once a week.    [provider]  FANAPT  4 MG TABS tablet Take 4 mg by mouth 3 (three) times daily. 11/01/20   [provider]  gabapentin  (NEURONTIN ) 100 MG capsule Take 2 capsules (200 mg total) by mouth 2 (two) times daily. 11/23/20 12/23/20  Floyd, Dan, DO  hydrOXYzine  (ATARAX /VISTARIL ) 25 MG tablet Take 1 tablet (25 mg total) by mouth 3 (three) times daily as needed for anxiety. Patient taking differently: Take 25 mg by mouth 3 (three) times daily. 10/30/19   Rochell Chroman, NP  ibuprofen  (ADVIL ) 800 MG tablet Take 800 mg by mouth 3 (three) times daily as needed for mild pain or moderate pain.     [provider]  levothyroxine  (SYNTHROID ) 25 MCG tablet Take 1 tablet (25 mcg total) by mouth daily at 6 (six) AM. 10/31/19   Rochell Chroman, NP  lidocaine  (HM LIDOCAINE  PATCH) 4 % Place 1 patch onto the skin daily. 11/22/22   Small, Brooke L, PA  medroxyPROGESTERone (DEPO-PROVERA) 150 MG/ML injection Inject 150 mg into the muscle every 3 (three) months.  09/07/19   [provider]  medroxyPROGESTERone Acetate 150 MG/ML SUSY Inject into  the muscle every 21 ( twenty-one) days. 11/26/20   [provider]  meloxicam  (MOBIC ) 15 MG tablet Take 15 mg by mouth daily.    [provider]  Multiple Vitamin (MULTIVITAMIN WITH MINERALS) TABS tablet Take 1 tablet by mouth daily.    [provider]  Semaglutide  (RYBELSUS ) 3 MG TABS Take 3 mg by mouth daily before breakfast. 09/10/20   [provider]  traZODone  (DESYREL ) 100 MG tablet Take 200 mg by mouth at bedtime as needed for sleep.    [provider]      Allergies    Diphenhydramine and Penicillins    Review of  Systems   Review of Systems  Musculoskeletal:  Positive for neck pain.  Neurological:  Positive for headaches.  All other systems reviewed and are negative.   Physical Exam Updated Vital Signs BP 116/75 (BP Location: Right Arm)   Pulse (!) 102   Temp 98.6 F (37 C) (Oral)   Resp 20   SpO2 100%  Physical Exam Vitals and nursing note reviewed.  Constitutional:      Appearance: Normal appearance.  HENT:     Head: Normocephalic and atraumatic.  Eyes:     Conjunctiva/sclera: Conjunctivae normal.  Pulmonary:     Effort: Pulmonary effort is normal. No respiratory distress.  Musculoskeletal:     Cervical back: Normal range of motion. Muscular tenderness present. No spinous process tenderness.     Comments: No midline spinal tenderness, step-offs crepitus Ranging all extremities without difficulty, neurovascular intact  Skin:    General: Skin is warm and dry.  Neurological:     Mental Status: She is alert.     Gait: Gait is intact.  Psychiatric:        Mood and Affect: Mood normal.        Behavior: Behavior normal.     ED Results / Procedures / Treatments   Labs (all labs ordered are listed, but only abnormal results are displayed) Labs Reviewed - No data to display  EKG None  Radiology CT Head Wo Contrast Result Date: 02/21/2024 CLINICAL DATA:  Headache EXAM: CT HEAD WITHOUT CONTRAST TECHNIQUE: Contiguous axial images were obtained from the base of the skull through the vertex without intravenous contrast. RADIATION DOSE REDUCTION: This exam was performed according to the departmental dose-optimization program which includes automated exposure control, adjustment of the mA and/or kV according to patient size and/or use of iterative reconstruction technique. COMPARISON:  Head CT 08/02/2013, report only. FINDINGS: Brain: No evidence of acute infarction, hemorrhage, hydrocephalus, extra-axial collection or mass lesion/mass effect. Vascular: No hyperdense vessel or unexpected  calcification. Skull: Normal. Negative for fracture or focal lesion. Sinuses/Orbits: No acute finding. Other: None. IMPRESSION: No acute intracranial abnormality. Electronically Signed   By: Tyron Gallon M.D.   On: 02/21/2024 17:57    Procedures Procedures    Medications Ordered in ED Medications  lidocaine  (LIDODERM ) 5 % 1 patch (1 patch Transdermal Patch Applied 02/21/24 1654)  acetaminophen  (TYLENOL ) tablet 650 mg (650 mg Oral Given by Other 02/21/24 1655)  naproxen  (NAPROSYN ) tablet 500 mg (500 mg Oral Given 02/21/24 1653)    ED Course/ Medical Decision Making/ A&P                                 Medical Decision Making Amount and/or Complexity of Data Reviewed Radiology: ordered.  Risk Prescription drug management.  This patient is a 33 y.o. female  who presents to the ED for concern of headache and neck pain.   Differential diagnoses prior to evaluation: The emergent differential diagnosis includes, but is not limited to,  Stroke, increased ICP, meningitis, CVA, intracranial tumor, venous sinus thrombosis, migraine, cluster headache, hypertension, drug related, head injury, tension headache, sinusitis, dental abscess, otitis media. This is not an exhaustive differential.   Past Medical History / Co-morbidities / Social History: anxiety, bipolar disorder, chronic back pain, cognitive developmental delay, depression, diabetes, hyperlipidemia, hypothyroidism, seizures, polysubstance abuse  Physical Exam: Physical exam performed. The pertinent findings include: Mildly tachycardic otherwise normal vital signs.  No acute distress.  Muscular cervical tenderness bilaterally, no midline tenderness.  Neurovascular intact in all extremities.  Normal gait.  Lab Tests/Imaging studies: I personally interpreted labs/imaging and the pertinent results include: CT head without acute abnormalities. I agree with the radiologist interpretation.  Medications: I ordered medication including  Lidoderm  patch, Tylenol , naproxen .  I have reviewed the patients home medicines and have made adjustments as needed.   Disposition: After consideration of the diagnostic results and the patients response to treatment, I feel that emergency department workup does not suggest an emergent condition requiring admission or immediate intervention beyond what has been performed at this time. The plan is: Discharge to home.  Suspect patient with cervical muscle strain, does heavy lifting at work.  Had some relief with naproxen .  Will send prescription for same, she is also requesting a muscle relaxer and I will send prescription for that as well.. The patient is safe for discharge and has been instructed to return immediately for worsening symptoms, change in symptoms or any other concerns.  Final Clinical Impression(s) / ED Diagnoses Final diagnoses:  Strain of neck muscle, initial encounter  Muscle spasm    Rx / DC Orders ED Discharge Orders          Ordered    naproxen  (NAPROSYN ) 500 MG tablet  2 times daily        02/21/24 1817    methocarbamol  (ROBAXIN ) 500 MG tablet  2 times daily        02/21/24 1817           Portions of this report may have been transcribed using voice recognition software. Every effort was made to ensure accuracy; however, inadvertent computerized transcription errors may be present.   Carlin Mamone T, PA-C 02/21/24 1819    Sueellen Emery, MD 02/21/24 2202

## 2024-05-02 ENCOUNTER — Ambulatory Visit (HOSPITAL_COMMUNITY)
Admission: EM | Admit: 2024-05-02 | Discharge: 2024-05-02 | Disposition: A | Discharge status: Home / Self Care | Payer: MEDICAID

## 2024-05-02 ENCOUNTER — Ambulatory Visit (INDEPENDENT_AMBULATORY_CARE_PROVIDER_SITE_OTHER): Payer: MEDICAID

## 2024-05-02 ENCOUNTER — Encounter (HOSPITAL_COMMUNITY): Payer: Self-pay

## 2024-05-02 DIAGNOSIS — S86012A Strain of left Achilles tendon, initial encounter: Secondary | ICD-10-CM

## 2024-05-02 DIAGNOSIS — S86019A Strain of unspecified Achilles tendon, initial encounter: Secondary | ICD-10-CM

## 2024-05-02 MED ORDER — NAPROXEN 500 MG PO TABS: 500.0000 mg | ORAL_TABLET | Freq: Two times a day (BID) | ORAL | 0 refills | Status: AC

## 2024-05-02 NOTE — ED Triage Notes (Signed)
 Patient here today with c/o left ankle pain X 2 week from a fall. Patient states that the pain worsening and has increased pain with weightbearing.

## 2024-05-02 NOTE — ED Provider Notes (Signed)
 [facility]    CSN: [id number] Arrival date & time: 05/02/24  1701      History   Chief Complaint Chief Complaint  Patient presents with   Ankle Pain    HPI Tami [name] is a 33 y.o. female.   Patient complains of pain to the back of her ankle.  Pt complains of pain for the past 2 weeks.  Pt reports pain with walking.    The history is provided by the patient. No language interpreter was used.  Ankle Pain Location:  Ankle Injury: no   Ankle location:  R ankle Pain details:    Quality:  Aching   Radiates to:  Does not radiate   Timing:  Constant   Progression:  Worsening Chronicity:  New Relieved by:  Nothing Worsened by:  Nothing Ineffective treatments:  None tried Risk factors: no recent illness     Past Medical History:  Diagnosis Date   ADHD (attention deficit hyperactivity disorder)    Anxiety    Bipolar disorder (HCC)    Chronic back pain    Cognitive developmental delay    Constipation    Depression    Diabetes mellitus without complication (HCC)    Type II - diabetes- 07/22/20- hasn't started on medicaion   HLD (hyperlipidemia)    Hypothyroidism    Seizures (HCC)     Patient Active Problem List   Diagnosis Date Noted   Substance induced mood disorder (HCC) 11/23/2020   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) 05/11/2020   Homicidal thoughts 10/25/2019   MDD (major depressive disorder) 10/25/2019   Bipolar affective disorder, current episode mixed (HCC) 07/28/2017   Subacute confusional state 07/21/2015   Memory loss 07/21/2015   Agitation 07/21/2015   Altered mental status 07/21/2015   Bipolar disorder (HCC) 09/04/2013   Thought disorder 09/02/2013   Marijuana dependence (HCC) 09/02/2013   Cocaine abuse (HCC) 09/02/2013   Tobacco dependence 09/02/2013    Past Surgical History:  Procedure Laterality Date   THROAT SURGERY     throat surgery when younger doesn't know why    TOOTH EXTRACTION N/A 07/23/2020   Procedure: DENTAL RESTORATION/EXTRACTIONS;  Surgeon: Sheryle Hamilton, DDS;  Location: [facility] OR;  Service: Oral Surgery;  Laterality: N/A;    OB History   No obstetric history on file.      Home Medications    Prior to Admission medications   Medication Sig Start Date End Date Taking? Authorizing Provider  fluticasone (FLONASE) 50 MCG/ACT nasal spray Place 1 spray into both nostrils daily. 02/28/24  Yes [provider]  naproxen  (NAPROSYN ) 500 MG tablet Take 1 tablet (500 mg total) by mouth 2 (two) times daily with a meal. 05/02/24 05/02/25 Yes Flint Raring K, PA-C  atorvastatin  (LIPITOR) 40 MG tablet Take 40 mg by mouth at bedtime. 05/01/20   [provider]  diclofenac Sodium (VOLTAREN) 1 % GEL Apply 4 g topically 2 (two) times daily. 04/29/20   [provider]  divalproex  (DEPAKOTE ) 500 MG DR tablet Take 1 tablet (500 mg total) by mouth every 12 (twelve) hours. 10/30/19   Wonda Clarita BRAVO, NP  FANAPT  4 MG TABS tablet Take 4 mg by mouth 3 (three) times daily. 11/01/20   [provider]  gabapentin  (NEURONTIN ) 100 MG capsule Take 2 capsules (200 mg total) by mouth 2 (two) times daily. Patient not taking: Reported on 05/02/2024 11/23/20 12/23/20  Emil Share, DO  hydrOXYzine  (ATARAX /VISTARIL ) 25 MG tablet Take 1  tablet (25 mg total) by mouth 3 (three) times daily as needed for anxiety. Patient taking differently: Take 25 mg by mouth 3 (three) times daily. 10/30/19   Wonda Clarita BRAVO, NP  ibuprofen  (ADVIL ) 800 MG tablet Take 800 mg by mouth 3 (three) times daily as needed for mild pain or moderate pain.     [provider]  levothyroxine  (SYNTHROID ) 25 MCG tablet Take 1 tablet (25 mcg total) by mouth daily at 6 (six) AM. 10/31/19   Sykes, Janet E, NP  loratadine (CLARITIN) 10 MG tablet Take 10 mg by mouth daily.    [provider]  medroxyPROGESTERone (DEPO-PROVERA) 150 MG/ML injection Inject 150 mg into the muscle every 3  (three) months.  09/07/19   [provider]  medroxyPROGESTERone Acetate 150 MG/ML SUSY Inject into the muscle every 21 ( twenty-one) days. 11/26/20   [provider]  Semaglutide  (RYBELSUS ) 3 MG TABS Take 3 mg by mouth daily before breakfast. 09/10/20   [provider]    Family History Family History  Problem Relation Age of Onset   Dementia Neg Hx     Social History Social History   Tobacco Use   Smoking status: Former    Current packs/day: 0.00    Average packs/day: 0.3 packs/day for 4.0 years (1.0 ttl pk-yrs)    Types: Cigarettes    Start date: 2015    Quit date: 2019    Years since quitting: 6.5   Smokeless tobacco: Never   Tobacco comments:     may smoke when she goes home.  Vaping Use   Vaping status: Never Used  Substance Use Topics   Alcohol use: No    Alcohol/week: 0.0 standard drinks of alcohol   Drug use: No     Allergies   Diphenhydramine and Penicillins   Review of Systems Review of Systems  All other systems reviewed and are negative.    Physical Exam Triage Vital Signs ED Triage Vitals  Encounter Vitals Group     BP 05/02/24 1712 98/63     Girls Systolic BP Percentile --      Girls Diastolic BP Percentile --      Boys Systolic BP Percentile --      Boys Diastolic BP Percentile --      Pulse Rate 05/02/24 1712 (!) 124     Resp 05/02/24 1712 16     Temp 05/02/24 1712 99.1 F (37.3 C)     Temp Source 05/02/24 1712 Oral     SpO2 05/02/24 1712 98 %     Weight --      Height --      Head Circumference --      Peak Flow --      Pain Score 05/02/24 1714 10     Pain Loc --      Pain Education --      Exclude from Growth Chart --    No data found.  Updated Vital Signs BP 98/63 (BP Location: Left Arm)   Pulse (!) 110   Temp 99.1 F (37.3 C) (Oral)   Resp 16   LMP  (LMP Unknown)   SpO2 98%   Visual Acuity Right Eye Distance:   Left Eye Distance:   Bilateral Distance:    Right Eye Near:   Left Eye  Near:    Bilateral Near:     Physical Exam Vitals reviewed.  Constitutional:      Appearance: Normal appearance.  Cardiovascular:  Rate and Rhythm: Normal rate.  Pulmonary:     Effort: Pulmonary effort is normal.  Musculoskeletal:        General: Swelling and tenderness present.  Skin:    General: Skin is warm.  Neurological:     General: No focal deficit present.     Mental Status: She is alert.      UC Treatments / Results  Labs (all labs ordered are listed, but only abnormal results are displayed) Labs Reviewed - No data to display  EKG   Radiology DG Ankle Complete Left Result Date: 05/02/2024 CLINICAL DATA:  Pain for 2 weeks.  Fall EXAM: LEFT ANKLE COMPLETE - 3 VIEW COMPARISON:  None Available. FINDINGS: There is no evidence of fracture, dislocation, or joint effusion. There is no evidence of arthropathy or other focal bone abnormality. Soft tissues are unremarkable. Tiny Achilles calcaneal spur. IMPRESSION: No acute osseous abnormality. Electronically Signed   By: Ranell Bring M.D.   On: 05/02/2024 17:46    Procedures Procedures (including critical care time)  Medications Ordered in UC Medications - No data to display  Initial Impression / Assessment and Plan / UC Course  I have reviewed the triage vital signs and the nursing notes.  Pertinent labs & imaging results that were available during my care of the patient were reviewed by me and considered in my medical decision making (see chart for details).    Xray  no fracture.  Pt tender over achilles, no deformity   Final Clinical Impressions(s) / UC Diagnoses   Final diagnoses:  Strain of Achilles tendon, initial encounter     Discharge Instructions      You may have strained your Achilles ligament.  Wear Ace wrap limit walking for the next week ice to area of discomfort medication as prescribed follow-up with your physician for recheck in 1 week if pain persist   ED Prescriptions     Medication  Sig Dispense Auth. Provider   naproxen  (NAPROSYN ) 500 MG tablet Take 1 tablet (500 mg total) by mouth 2 (two) times daily with a meal. 20 tablet Flint Sonny POUR, PA-C      PDMP not reviewed this encounter. An After Visit Summary was printed and given to the patient.        Flint Sonny POUR, PA-C 05/02/24 8155

## 2024-05-02 NOTE — Discharge Instructions (Signed)
 You may have strained your Achilles ligament.  Wear Ace wrap limit walking for the next week ice to area of discomfort medication as prescribed follow-up with your physician for recheck in 1 week if pain persist
